# Patient Record
Sex: Female | Born: 1965 | Race: Black or African American | Hispanic: No | Smoking: Never smoker
Health system: Southern US, Community
[De-identification: ages and names within clinical notes are randomized; demographics above are authoritative.]

## PROBLEM LIST (undated history)

## (undated) DIAGNOSIS — N946 Dysmenorrhea, unspecified: Secondary | ICD-10-CM

## (undated) DIAGNOSIS — I10 Essential (primary) hypertension: Secondary | ICD-10-CM

## (undated) HISTORY — PX: HAND SURGERY: SHX662

---

## 2009-10-25 HISTORY — PX: GALLBLADDER SURGERY: SHX652

## 2013-11-13 ENCOUNTER — Encounter (HOSPITAL_COMMUNITY): Payer: Self-pay | Admitting: Emergency Medicine

## 2013-11-13 ENCOUNTER — Emergency Department (HOSPITAL_COMMUNITY)
Admission: EM | Admit: 2013-11-13 | Discharge: 2013-11-13 | Discharge status: Against Medical Advice | Payer: Self-pay | Attending: Emergency Medicine | Admitting: Emergency Medicine

## 2013-11-13 ENCOUNTER — Emergency Department (HOSPITAL_COMMUNITY): Payer: Self-pay

## 2013-11-13 DIAGNOSIS — R112 Nausea with vomiting, unspecified: Secondary | ICD-10-CM | POA: Insufficient documentation

## 2013-11-13 DIAGNOSIS — R1084 Generalized abdominal pain: Secondary | ICD-10-CM | POA: Insufficient documentation

## 2013-11-13 DIAGNOSIS — R109 Unspecified abdominal pain: Secondary | ICD-10-CM

## 2013-11-13 DIAGNOSIS — R630 Anorexia: Secondary | ICD-10-CM | POA: Insufficient documentation

## 2013-11-13 DIAGNOSIS — Z79899 Other long term (current) drug therapy: Secondary | ICD-10-CM | POA: Insufficient documentation

## 2013-11-13 DIAGNOSIS — I10 Essential (primary) hypertension: Secondary | ICD-10-CM | POA: Insufficient documentation

## 2013-11-13 DIAGNOSIS — Z3202 Encounter for pregnancy test, result negative: Secondary | ICD-10-CM | POA: Insufficient documentation

## 2013-11-13 HISTORY — DX: Essential (primary) hypertension: I10

## 2013-11-13 LAB — COMPREHENSIVE METABOLIC PANEL
ALT: 6 U/L (ref 0–35)
AST: 13 U/L (ref 0–37)
Albumin: 3.6 g/dL (ref 3.5–5.2)
Alkaline Phosphatase: 74 U/L (ref 39–117)
BILIRUBIN TOTAL: 0.3 mg/dL (ref 0.3–1.2)
BUN: 7 mg/dL (ref 6–23)
CHLORIDE: 105 meq/L (ref 96–112)
CO2: 21 meq/L (ref 19–32)
CREATININE: 0.68 mg/dL (ref 0.50–1.10)
Calcium: 8.9 mg/dL (ref 8.4–10.5)
GFR calc Af Amer: >90 mL/min (ref >90)
Glucose, Bld: 106 mg/dL — ABNORMAL HIGH (ref 70–99)
Potassium: 3.6 mEq/L — ABNORMAL LOW (ref 3.7–5.3)
Sodium: 141 mEq/L (ref 137–147)
Total Protein: 7.1 g/dL (ref 6.0–8.3)

## 2013-11-13 LAB — CBC WITH DIFFERENTIAL/PLATELET
Basophils Absolute: 0 10*3/uL (ref 0.0–0.1)
Basophils Relative: 1 % (ref 0–1)
Eosinophils Absolute: 0.2 10*3/uL (ref 0.0–0.7)
Eosinophils Relative: 6 % — ABNORMAL HIGH (ref 0–5)
HEMATOCRIT: 36.8 % (ref 36.0–46.0)
HEMOGLOBIN: 12.7 g/dL (ref 12.0–15.0)
LYMPHS ABS: 1.4 10*3/uL (ref 0.7–4.0)
LYMPHS PCT: 34 % (ref 12–46)
MCH: 30.5 pg (ref 26.0–34.0)
MCHC: 34.5 g/dL (ref 30.0–36.0)
MCV: 88.2 fL (ref 78.0–100.0)
MONO ABS: 0.3 10*3/uL (ref 0.1–1.0)
Monocytes Relative: 7 % (ref 3–12)
Neutro Abs: 2.2 10*3/uL (ref 1.7–7.7)
Neutrophils Relative %: 53 % (ref 43–77)
Platelets: 266 10*3/uL (ref 150–400)
RBC: 4.17 MIL/uL (ref 3.87–5.11)
RDW: 13.9 % (ref 11.5–15.5)
WBC: 4.2 10*3/uL (ref 4.0–10.5)

## 2013-11-13 LAB — URINALYSIS, ROUTINE W REFLEX MICROSCOPIC
Glucose, UA: NEGATIVE mg/dL
Hgb urine dipstick: NEGATIVE
KETONES UR: NEGATIVE mg/dL
LEUKOCYTES UA: NEGATIVE
Nitrite: NEGATIVE
PROTEIN: NEGATIVE mg/dL
Specific Gravity, Urine: 1.024 (ref 1.005–1.030)
UROBILINOGEN UA: 1 mg/dL (ref 0.0–1.0)
pH: 6.5 (ref 5.0–8.0)

## 2013-11-13 LAB — LIPASE, BLOOD: Lipase: 18 U/L (ref 11–59)

## 2013-11-13 LAB — CG4 I-STAT (LACTIC ACID): Lactic Acid, Venous: 0.69 mmol/L (ref 0.5–2.2)

## 2013-11-13 LAB — POCT PREGNANCY, URINE: Preg Test, Ur: NEGATIVE

## 2013-11-13 MED ORDER — SODIUM CHLORIDE 0.9 % IV BOLUS (SEPSIS)
1000.0000 mL | Freq: Once | INTRAVENOUS | Status: AC
  Administered 2013-11-13: 1000 mL via INTRAVENOUS

## 2013-11-13 MED ORDER — MORPHINE SULFATE 4 MG/ML IJ SOLN
4.0000 mg | Freq: Once | INTRAMUSCULAR | Status: AC
  Administered 2013-11-13: 4 mg via INTRAVENOUS
  Filled 2013-11-13: qty 1

## 2013-11-13 MED ORDER — ONDANSETRON 4 MG PO TBDP: ORAL_TABLET | ORAL | Status: DC

## 2013-11-13 MED ORDER — IOHEXOL 300 MG/ML  SOLN: 50.0000 mL | Freq: Once | INTRAMUSCULAR | Status: DC | PRN

## 2013-11-13 MED ORDER — ONDANSETRON HCL 4 MG/2ML IJ SOLN
4.0000 mg | Freq: Once | INTRAMUSCULAR | Status: AC
  Administered 2013-11-13: 4 mg via INTRAVENOUS
  Filled 2013-11-13: qty 2

## 2013-11-13 MED ORDER — CLONIDINE HCL 0.1 MG PO TABS
0.2000 mg | ORAL_TABLET | Freq: Once | ORAL | Status: AC
  Administered 2013-11-13: 0.2 mg via ORAL
  Filled 2013-11-13: qty 2

## 2013-11-13 MED ORDER — CLONIDINE HCL 0.2 MG PO TABS: 0.2000 mg | ORAL_TABLET | Freq: Three times a day (TID) | ORAL | Status: DC

## 2013-11-13 NOTE — ED Notes (Signed)
Pt up to and to the restroom.

## 2013-11-13 NOTE — Progress Notes (Signed)
   CARE MANAGEMENT ED NOTE 11/13/2013  Patient:  Regina Estes, Regina Estes   Account Number:  [id number]  Date Initiated:  11/13/2013  Documentation initiated by:  Jackelyn Poling  Subjective/Objective Assessment:   48 yr old female with coventry coverage confirms she recently moved to [place] with family and does not have a coventry pcp at this time but she is aware of how to obtain an in network coventry pcp     Subjective/Objective Assessment Detail:   Pt prefers to complete internet search and to inquire of family their recommendations vs CM offering her a list of [place] providers [place] in [place] [id number]     Action/Plan:   CM offered to assist pt with a list of coventry in net work providers   Action/Plan Detail:   Anticipated DC Date:  11/13/2013     Status Recommendation to Physician:   Result of Recommendation:    Other ED [place]  Other  PCP issues  Outpatient Services - Pt will follow up    Choice offered to / List presented to:            Status of service:  Completed, signed off  ED Comments:   ED Comments Detail:  Cm reviewed ED level of care for crisis/emergent services and community pcp level of care to manage continuous or chronic medical concerns.  The pt voiced understanding CM encouraged pt and discussed pt's responsibility to verify with pt's insurance carrier that any recommended medical provider offered by any emergency room or a hospital provider is within the carrier's network. The pt voiced understanding

## 2013-11-13 NOTE — ED Notes (Signed)
Pt c/o gen abd pain, NV.  No diarrhea.  Sx since Sunday.

## 2013-11-13 NOTE — ED Provider Notes (Signed)
CSN: [id number]     Arrival date & time 11/13/13  1150 History   None    Chief Complaint  Patient presents with  . Abdominal Pain  . Nausea  . Emesis   (Consider location/radiation/quality/duration/timing/severity/associated sxs/prior Treatment) Patient is a 48 y.o. female presenting with abdominal pain and vomiting.  Abdominal Pain Associated symptoms: vomiting   Associated symptoms: no chest pain, no constipation, no diarrhea, no dysuria, no hematuria, no shortness of breath, no vaginal bleeding and no vaginal discharge   Emesis Associated symptoms: abdominal pain   Associated symptoms: no diarrhea    48 yo female presents with gradual onset abdominal pain with N/V x 2 days. Pain described as sharp constant currently 10/10 pain localized to lower abdomen without radiation. Patient asked to point to where pain is worse and she rubs her lower abdomen stating that there is no specific point that hurts. Patient states She was up all night vomiting every 2 hours. Patient describes a yellowish emesis. Denies any diarrhea, fever/chills. Denies any urinary symptoms, vaginal pain, bleeding/discharge. Admits once a week BMs is normal for her and states last BM was prior to onset of symptoms a couple days ago.  Patient denies sexual activity and states her menstrual cycles are regular every month. LMP was last week. Patient PMH significant for HTN and prior Cholecystectomy. Patient has just recently moved from [place] and states she has not taken her BP medication in 3 days because she cannot find it (believes she has lost it in the move).   Denies alcohol use.   Patient  Past Medical History  Diagnosis Date  . Hypertension    History reviewed. No pertinent past surgical history. History reviewed. No pertinent family history. History  Substance Use Topics  . Smoking status: Never Smoker   . Smokeless tobacco: Not on file  . Alcohol Use: No   OB History   Grav Para Term Preterm Abortions  TAB SAB Ect Mult Living                 Review of Systems  Constitutional: Positive for appetite change (poor ).  Respiratory: Negative for shortness of breath.   Cardiovascular: Negative for chest pain.  Gastrointestinal: Positive for vomiting and abdominal pain. Negative for diarrhea, constipation, blood in stool and rectal pain.  Genitourinary: Negative for dysuria, frequency, hematuria, flank pain, decreased urine volume, vaginal bleeding, vaginal discharge, difficulty urinating, vaginal pain, menstrual problem and pelvic pain.  All other systems reviewed and are negative.    Allergies  Review of patient's allergies indicates no known allergies.  Home Medications   Current Outpatient Rx  Name  Route  Sig  Dispense  Refill  . ibuprofen (ADVIL,MOTRIN) 800 MG tablet   Oral   Take 800 mg by mouth every 8 (eight) hours as needed.         . cloNIDine (CATAPRES) 0.2 MG tablet   Oral   Take 1 tablet (0.2 mg total) by mouth 3 (three) times daily.   90 tablet   0   . ondansetron (ZOFRAN ODT) 4 MG disintegrating tablet      4mg  ODT q4 hours prn nausea/vomit   10 tablet   0    BP 155/98  Pulse 61  Temp(Src) 98.2 F (36.8 C) (Oral)  Resp 16  SpO2 100%  LMP 11/06/2013 Physical Exam  Nursing note and vitals reviewed. Constitutional: She is oriented to person, place, and time. She appears well-developed and well-nourished. No distress.  HENT:  Head: Normocephalic and atraumatic.  Eyes: Conjunctivae and EOM are normal.  Cardiovascular: Normal rate and regular rhythm.  Exam reveals no gallop and no friction rub.   No murmur heard. Pulmonary/Chest: Effort normal and breath sounds normal. No respiratory distress. She has no wheezes. She has no rales.  Abdominal: Soft. Bowel sounds are normal. She exhibits no distension and no mass. There is no hepatosplenomegaly. There is generalized tenderness. There is CVA tenderness. There is no rigidity, no rebound, no guarding, no  tenderness at McBurney's point and negative Murphy's sign.  Musculoskeletal: Normal range of motion. She exhibits no edema.  Neurological: She is alert and oriented to person, place, and time.  Skin: Skin is warm and dry. No rash noted. She is not diaphoretic.  Psychiatric: She has a normal mood and affect. Her behavior is normal.    ED Course  Procedures (including critical care time) Labs Review Labs Reviewed  CBC WITH DIFFERENTIAL - Abnormal; Notable for the following:    Eosinophils Relative 6 (*)    All other components within normal limits  COMPREHENSIVE METABOLIC PANEL - Abnormal; Notable for the following:    Potassium 3.6 (*)    Glucose, Bld 106 (*)    All other components within normal limits  URINALYSIS, ROUTINE W REFLEX MICROSCOPIC - Abnormal; Notable for the following:    Color, Urine AMBER (*)    APPearance CLOUDY (*)    Bilirubin Urine SMALL (*)    All other components within normal limits  LIPASE, BLOOD  POCT PREGNANCY, URINE  CG4 I-STAT (LACTIC ACID)   Imaging Review No results found.  EKG Interpretation   None       MDM   1. Abdominal pain   2. N&V (nausea and vomiting)     Patient afebrile. Hypertensive on admission, suspect related rebound HTN from abrupt discontinuation of clonidine. BP improved throughout stay at ED.  UA not consistent with UTI.  Urine preg negative Lipase Normal.  Lactic acid WNL.    Patient pain unable to be controlled in ED though patient does not appear to be in significant pain. Patient up walking around in NAD. Patient nausea resolved. Patient recommended to undergo CT abdomen/pelvis. Patient refuses study at this time. States she would rather go home and try to sleep it off. Patient informed that we cannot adequately assess her pain source without the aforementioned imaging. Patient confirms understanding and still refuses study at this time. Patient left AMA. Patient given rx for her BP medication and resource guide.  Advised followup with PCP as soon as possible.   Meds given in ED:  Medications  iohexol (OMNIPAQUE) 300 MG/ML solution 50 mL (not administered)  ondansetron (ZOFRAN) injection 4 mg (4 mg Intravenous Given 11/13/13 1541)  sodium chloride 0.9 % bolus 1,000 mL (0 mLs Intravenous Stopped 11/13/13 1723)  morphine 4 MG/ML injection 4 mg (4 mg Intravenous Given 11/13/13 1541)  morphine 4 MG/ML injection 4 mg (4 mg Intravenous Given 11/13/13 1628)  cloNIDine (CATAPRES) tablet 0.2 mg (0.2 mg Oral Given 11/13/13 1744)    Discharge Medication List as of 11/13/2013  5:37 PM    START taking these medications   Details  ondansetron (ZOFRAN ODT) 4 MG disintegrating tablet 4mg  ODT q4 hours prn nausea/vomit, Print          Sherrie George, PA-C 11/14/13 1510

## 2013-11-13 NOTE — Discharge Instructions (Signed)
Follow up as soon as possible with a primary care provider. Refer to resource guide below for follow up. If your symptoms worsen please return to ED.    Emergency Department Resource Guide 1) Find a Doctor and Pay Out of Pocket Although you won't have to find out who is covered by your insurance plan, it is a good idea to ask around and get recommendations. You will then need to call the office and see if the doctor you have chosen will accept you as a new patient and what types of options they offer for patients who are self-pay. Some doctors offer discounts or will set up payment plans for their patients who do not have insurance, but you will need to ask so you aren't surprised when you get to your appointment.  2) Contact Your Local Health Department Not all health departments have doctors that can see patients for sick visits, but many do, so it is worth a call to see if yours does. If you don't know where your local health department is, you can check in your phone book. The CDC also has a tool to help you locate your state's health department, and many state websites also have listings of all of their local health departments.  3) Find a [facility] If your illness is not likely to be very severe or complicated, you may want to try a walk in clinic. These are popping up all over the country in pharmacies, drugstores, and shopping centers. They're usually staffed by nurse practitioners or physician assistants that have been trained to treat common illnesses and complaints. They're usually fairly quick and inexpensive. However, if you have serious medical issues or chronic medical problems, these are probably not your best option.  No Primary Care Doctor: - Call Health Connect at  [phone] - they can help you locate a primary care doctor that  accepts your insurance, provides certain services, etc. - Physician Referral Service- [phone]  Chronic Pain Problems: Organization          Address  Phone   Notes  [facility]  [phone] Patients need to be referred by their primary care doctor.   Medication Assistance: Organization         Address  Phone   Notes  [facility] Medication [facility] [address] [place], [place] [address] [phone] --Must be a resident of [facility] -- Must have NO insurance coverage whatsoever (no Medicaid/ Medicare, etc.) -- The pt. MUST have a primary care doctor that directs their care regularly and follows them in the community   MedAssist  [phone]   [employer]  [phone]    Agencies that provide inexpensive medical care: Organization         Address  Phone   Notes  [place]  [phone]   [facility]    [phone]   [facility] [place], [place] [address] [phone]   [place] [address], [place] [phone]   Planned Parenthood    [phone]   [facility]    [phone]   [place] and [place] [address], [place] Phone:  [phone], Fax:  [phone] Hours of Operation:  9 am - 6 pm, M-F.  Also accepts Medicaid/Medicare and self-pay.  [facility] for [place] [address] [place],  [address], [place] Phone: [phone], Fax: [phone]. Hours of Operation:  8:30 am - 5:30 pm, M-F.  Also accepts Medicaid and self-pay.  [facility] [address] [place] Phone: [phone]   [place], [place], [place] [phone], Ext. [phone] Mondays & Thursdays: 7-9 AM.  First 15 patients are seen on a first come, first serve basis.    [place] Providers:  Organization         Address  Phone   Notes  [facility] [address], [place] [phone] Also accepts self-pay patients.  [facility] [address], [place]  [phone]   [place], [address], [place] [phone]   [facility] [address], [place] [phone]   Lucianne Lei [address], [place]   [phone] Only accepts [place] Access [place] patients after they have their name applied to their card.   Self-Pay (no insurance) in [facility]:  Organization         Address  Phone   Notes  Sickle Cell Patients, [facility] Internal Medicine [place] [phone]   [facility] [place] [phone]   [facility] [place]  [place], [place], [place] [phone]   [facility]/Dr. Osei-Bonsu  [address] [place] or [address], [place] [phone] Phone number for both [place] and [place] locations is the same.  Urgent Medical and [facility] [address] [place] [phone]   [facility] [address] [place] or [address] [phone]   [facility] [address], [place] [phone], phone; [phone], fax Sees patients 1st and 3rd Saturday of every month.  Must not qualify for public or private insurance (i.e. Medicaid, Medicare, [place] Health Choice, Veterans' Benefits)  Household income should be no more than 200% of the poverty level The clinic cannot treat you if you are pregnant or think you are pregnant  Sexually transmitted diseases are not treated at the clinic.    Dental Care: Organization         Address  Phone  Notes  [facility] Department of [facility] [place] [phone] Accepts children up to age 74 who are enrolled in [place] or [place]; pregnant women with a Medicaid card; and  children who have applied for Medicaid or [place] Health Choice, but were declined, whose parents can pay a reduced fee at time of service.  [facility] Department of [facility]  [address], [place] [phone] Accepts children up to age 79 who are enrolled in [place] or [place]; pregnant women with a Medicaid card; and children who have applied for Medicaid or [place] Health Choice, but were declined, whose parents can pay a reduced fee at time of service.  [place] Adult [facility] Access PROGRAM  [place] [phone] Patients are seen by appointment only. Walk-ins are not accepted. [place] will see patients 64 years of age and older. Monday - Tuesday (8am-5pm) Most Wednesdays (8:30-5pm) $30 per visit, cash only  [place]  [address], [place] [phone] Patients are seen by appointment only. Walk-ins are not accepted. [place] will see patients 31 years of age and older. One Wednesday Evening (Monthly: Volunteer Based).  $30 per visit, cash only  [place]  [phone] for adults; Children under age 53, call Graduate Pediatric Dentistry at [phone]. Children aged 35-14, please call [phone] to request a pediatric application.  Dental services are provided in all areas of dental care including fillings, crowns and bridges, complete and partial dentures, implants, gum treatment, root canals, and extractions. Preventive care is also provided. Treatment is provided to both adults and children. Patients are selected via a lottery and there is often a waiting list.   [facility] [address] [place]  [phone] [email or web address]   [facility] [address] [place], [place] [phone], Ext. [phone] Second and Fourth Thursday of each month, opens at 6:30 AM; Clinic ends at 9 AM.  Patients are seen on a first-come first-served  basis, and a limited number are seen during each clinic.   [facility]  [address] [place] Danker [place], [place] [phone]   Eligibility Requirements You must have lived in [place], [place], or [place] counties for at least the last three months.   You cannot be eligible for state or federal sponsored [employer], including [employer], [place], or [employer].   You generally cannot be eligible for healthcare insurance through your employer.    How to apply: Eligibility screenings are held every Tuesday and Wednesday afternoon from 1:00 pm until 4:00 pm. You do not need an appointment for the interview!  [facility] [address], [place], [place]   [place]  [place] Department  [place]  [phone]    Behavioral Health Resources in the Community: Intensive Outpatient Programs Organization         Address  Phone  Notes  [place] [address], [place], [place] [phone]   [facility] Outpatient [address] [place], [place]   ADS: Alcohol & Drug Svcs [address], [place], [place]   [place] [address],  [place], [place] or [phone]   Substance Abuse Resources Organization         Address  Phone  Notes  Alcohol and Drug Services  [phone]   [place]  [phone]   The [place]   [place]  [phone]   Residential & Outpatient Substance Abuse Program  [phone]   Psychological Services Organization         Address  Phone  Notes  [facility] [place]  [place]  [phone]   [place] [address], [place] or [phone]    Mobile Crisis Teams Organization          Address  Phone  Notes  [facility], Mobile Crisis Care Unit  [phone]   [facility] Psychotherapeutic Services  [address] [place], [place]   [place] Levels [address], [place] [phone]    Self-Help/Support Groups Organization         Address  Phone             Notes  [place]. of [place] -  variety of support groups  [phone] Call for more information  Narcotics Anonymous (NA), [facility] [address], [employer] [place]  2 meetings at this location   Residential Facilities manager         Address  Phone  Notes  [facility] [place],    [place]  [phone]   [facility]  [address] [place] [phone], [place], [place]   [place], [place] [phone] Admissions: 8am-3pm M-F  Incentives Substance [place] [address]    [place], [place] [phone]   [facility] [address] [place], [place], [place]   [facility] [address]  [place], [place]   [facility] Programs - Intensive Outpatient [address], [place], [place]   [facility] [address]  [place], [place] [phone] or [phone]   Residential Treatment Services (RTS) [address], [place], [place] Accepts Medicaid  [facility] [place] [address],  [place] [phone] Substance Abuse/Addiction Treatment   [facility] Organization         Address  Phone  Notes  [facility]  [phone]   Domenic Schwab, PhD [address] [place] [place], [place]   [phone] or [phone]   [place], [place] [phone]   [facility] [address] [place], [place] [phone] Insurance/Medicaid/sponsorship  through [facility] and Families [address] [place]                                    [place], [place] [phone] [place] [address], [place] [phone]    Dr. Adele Schilder  [phone]   [facility] Dept. 1) [address] [place] 2) [place] 3)  [place] [address], [place] [phone]  [phone]   [place] [phone] or [phone] (After Hours)

## 2013-11-16 NOTE — ED Provider Notes (Signed)
Medical screening examination/treatment/procedure(s) were conducted as a shared visit with non-physician practitioner(s) and myself.  I personally evaluated the patient during the encounter. Pt presents w/ low ab pain n/v.  Although pt's pain unable to be controlled in ED, she appeared comfortable and had benign abdominal exam on my PE.  She was offered CT ab/pelvis which she declined.  It has been explained to pt we cannot diagnose a potentially life-threatened cause of ab pain w/o CT.  She has chosen to leave AMA. I believe pt has the capacity to make this decision.   EKG Interpretation   None         Neta Ehlers, MD 11/16/13 1036

## 2013-11-20 ENCOUNTER — Emergency Department (HOSPITAL_COMMUNITY)
Admission: EM | Admit: 2013-11-20 | Discharge: 2013-11-21 | Disposition: A | Discharge status: Home / Self Care | Payer: Self-pay | Attending: Emergency Medicine | Admitting: Emergency Medicine

## 2013-11-20 ENCOUNTER — Encounter (HOSPITAL_COMMUNITY): Payer: Self-pay | Admitting: Emergency Medicine

## 2013-11-20 ENCOUNTER — Emergency Department (HOSPITAL_COMMUNITY): Payer: Self-pay

## 2013-11-20 DIAGNOSIS — D259 Leiomyoma of uterus, unspecified: Secondary | ICD-10-CM | POA: Insufficient documentation

## 2013-11-20 DIAGNOSIS — K5900 Constipation, unspecified: Secondary | ICD-10-CM | POA: Insufficient documentation

## 2013-11-20 DIAGNOSIS — Z9089 Acquired absence of other organs: Secondary | ICD-10-CM | POA: Insufficient documentation

## 2013-11-20 DIAGNOSIS — R109 Unspecified abdominal pain: Secondary | ICD-10-CM | POA: Insufficient documentation

## 2013-11-20 DIAGNOSIS — M25559 Pain in unspecified hip: Secondary | ICD-10-CM | POA: Insufficient documentation

## 2013-11-20 DIAGNOSIS — Z79899 Other long term (current) drug therapy: Secondary | ICD-10-CM | POA: Insufficient documentation

## 2013-11-20 DIAGNOSIS — Z3202 Encounter for pregnancy test, result negative: Secondary | ICD-10-CM | POA: Insufficient documentation

## 2013-11-20 DIAGNOSIS — I10 Essential (primary) hypertension: Secondary | ICD-10-CM | POA: Insufficient documentation

## 2013-11-20 LAB — URINALYSIS, ROUTINE W REFLEX MICROSCOPIC
Glucose, UA: NEGATIVE mg/dL
Hgb urine dipstick: NEGATIVE
Ketones, ur: NEGATIVE mg/dL
Nitrite: NEGATIVE
Protein, ur: 30 mg/dL — AB
Specific Gravity, Urine: 1.026 (ref 1.005–1.030)
Urobilinogen, UA: 1 mg/dL (ref 0.0–1.0)
pH: 6.5 (ref 5.0–8.0)

## 2013-11-20 LAB — CBC WITH DIFFERENTIAL/PLATELET
Basophils Absolute: 0 10*3/uL (ref 0.0–0.1)
Basophils Relative: 1 % (ref 0–1)
Eosinophils Absolute: 0.3 10*3/uL (ref 0.0–0.7)
Eosinophils Relative: 5 % (ref 0–5)
HCT: 37.8 % (ref 36.0–46.0)
Hemoglobin: 13 g/dL (ref 12.0–15.0)
Lymphocytes Relative: 49 % — ABNORMAL HIGH (ref 12–46)
Lymphs Abs: 2.8 10*3/uL (ref 0.7–4.0)
MCH: 30.4 pg (ref 26.0–34.0)
MCHC: 34.4 g/dL (ref 30.0–36.0)
MCV: 88.5 fL (ref 78.0–100.0)
Monocytes Absolute: 0.3 10*3/uL (ref 0.1–1.0)
Monocytes Relative: 6 % (ref 3–12)
Neutro Abs: 2.2 10*3/uL (ref 1.7–7.7)
Neutrophils Relative %: 39 % — ABNORMAL LOW (ref 43–77)
Platelets: 248 10*3/uL (ref 150–400)
RBC: 4.27 MIL/uL (ref 3.87–5.11)
RDW: 13.9 % (ref 11.5–15.5)
WBC: 5.7 10*3/uL (ref 4.0–10.5)

## 2013-11-20 LAB — COMPREHENSIVE METABOLIC PANEL
ALT: 8 U/L (ref 0–35)
AST: 14 U/L (ref 0–37)
Albumin: 3.8 g/dL (ref 3.5–5.2)
Alkaline Phosphatase: 74 U/L (ref 39–117)
BUN: 8 mg/dL (ref 6–23)
CO2: 22 mEq/L (ref 19–32)
Calcium: 9 mg/dL (ref 8.4–10.5)
Chloride: 103 mEq/L (ref 96–112)
Creatinine, Ser: 0.73 mg/dL (ref 0.50–1.10)
GFR calc Af Amer: >90 mL/min (ref >90)
GFR calc non Af Amer: >90 mL/min (ref >90)
Glucose, Bld: 132 mg/dL — ABNORMAL HIGH (ref 70–99)
Potassium: 3.5 mEq/L — ABNORMAL LOW (ref 3.7–5.3)
Sodium: 139 mEq/L (ref 137–147)
Total Bilirubin: <0.2 mg/dL — ABNORMAL LOW (ref 0.3–1.2)
Total Protein: 7.5 g/dL (ref 6.0–8.3)

## 2013-11-20 LAB — POCT PREGNANCY, URINE: PREG TEST UR: NEGATIVE

## 2013-11-20 LAB — URINE MICROSCOPIC-ADD ON

## 2013-11-20 LAB — LIPASE, BLOOD: Lipase: 25 U/L (ref 11–59)

## 2013-11-20 MED ORDER — MORPHINE SULFATE 4 MG/ML IJ SOLN
4.0000 mg | Freq: Once | INTRAMUSCULAR | Status: AC
  Administered 2013-11-20: 4 mg via INTRAVENOUS
  Filled 2013-11-20: qty 1

## 2013-11-20 MED ORDER — ONDANSETRON HCL 4 MG/2ML IJ SOLN
4.0000 mg | Freq: Once | INTRAMUSCULAR | Status: AC
  Administered 2013-11-20: 4 mg via INTRAVENOUS
  Filled 2013-11-20: qty 2

## 2013-11-20 MED ORDER — IOHEXOL 300 MG/ML  SOLN
100.0000 mL | Freq: Once | INTRAMUSCULAR | Status: AC | PRN
  Administered 2013-11-20: 100 mL via INTRAVENOUS

## 2013-11-20 MED ORDER — IOHEXOL 300 MG/ML  SOLN
25.0000 mL | Freq: Once | INTRAMUSCULAR | Status: AC | PRN
  Administered 2013-11-20: 25 mL via ORAL

## 2013-11-20 MED ORDER — SODIUM CHLORIDE 0.9 % IV BOLUS (SEPSIS)
500.0000 mL | Freq: Once | INTRAVENOUS | Status: AC
  Administered 2013-11-20: 500 mL via INTRAVENOUS

## 2013-11-20 NOTE — ED Notes (Signed)
2 unsuccessful IV attempts.

## 2013-11-20 NOTE — ED Provider Notes (Signed)
CSN: [id number]     Arrival date & time 11/20/13  1628 History   First MD Initiated Contact with Patient 11/20/13 2156     Chief Complaint  Patient presents with  . Nausea  . Emesis  . Leg Pain   (Consider location/radiation/quality/duration/timing/severity/associated sxs/prior Treatment) HPI  Pt is a 48 yo female with hx of the same pain x 1 week.  Pt states she was seen in the ER at that time, but preferred to go home and try to manage her own symptoms as opposed to waiting for a CT.  Imaging was not done last week.  Pt states the pain has worsened since then, and progressed to sharp pains in her thighs as well.  She describes the pain as sharp and constant, and rates it 10/10.  She also complains of N/V and states she hasn't eaten or had anything to drink in the past week. She denies diarrhea, and states that normally she has bowel movements only every 1.5 to 2 weeks for as long as she can remember.  She has had a cholecystectomy, but denies any other abdominal surgeries.  Her LMP was 1 week ago, and she states she is not currently sexually active, so states there's no way she could be pregnant.  She denies vaginal or urinary changes.  She also denies chest pain, SOB, fever, or chills.  Pt also states she has not taken her HTN meds x 1 week due to losing it in her move from [place], and after having it filled in the ER, the Nausea and vomiting.   Past Medical History  Diagnosis Date  . Hypertension    History reviewed. No pertinent past surgical history. History reviewed. No pertinent family history. History  Substance Use Topics  . Smoking status: Never Smoker   . Smokeless tobacco: Not on file  . Alcohol Use: No   OB History   Grav Para Term Preterm Abortions TAB SAB Ect Mult Living                 Review of Systems  Constitutional: Positive for appetite change. Negative for fever, chills and fatigue.  HENT: Negative.   Respiratory: Negative for chest tightness and shortness of  breath.   Cardiovascular: Negative for chest pain.  Gastrointestinal: Positive for nausea, vomiting, abdominal pain and constipation. Negative for diarrhea and blood in stool.  Genitourinary: Negative for dysuria, urgency, frequency, hematuria, flank pain, decreased urine volume, vaginal discharge, difficulty urinating, vaginal pain and menstrual problem.  Musculoskeletal: Negative for back pain and myalgias.  Neurological: Negative.     Allergies  Review of patient's allergies indicates no known allergies.  Home Medications   Current Outpatient Rx  Name  Route  Sig  Dispense  Refill  . cloNIDine (CATAPRES) 0.2 MG tablet   Oral   Take 1 tablet (0.2 mg total) by mouth 3 (three) times daily.   90 tablet   0   . ibuprofen (ADVIL,MOTRIN) 800 MG tablet   Oral   Take 800 mg by mouth every 8 (eight) hours as needed (pain).          . ondansetron (ZOFRAN ODT) 4 MG disintegrating tablet      4mg  ODT q4 hours prn nausea/vomit   10 tablet   0    LMP 11/06/2013 Physical Exam  Constitutional: She is oriented to person, place, and time. She appears well-developed and well-nourished.  HENT:  Head: Normocephalic and atraumatic.  Eyes: Pupils are equal, round, and reactive  to light.  Neck: Normal range of motion.  Cardiovascular: Normal rate and regular rhythm.   No murmur heard. Pulmonary/Chest: Effort normal and breath sounds normal. No respiratory distress.  Abdominal: She exhibits no distension. There is tenderness. There is no rebound. No hernia.  Tender to bilateral lower quadrants without discrete palpable mass. BS positive. No distender. Upper quadrants of abdomen non-tender.   Musculoskeletal: Normal range of motion.       Right shoulder: She exhibits normal range of motion, no tenderness, no swelling and no deformity.  Thighs bilaterally non-tender, without redness, swelling or discoloration.  Neurological: She is alert and oriented to person, place, and time.  Skin: Skin  is warm and dry. She is not diaphoretic.  Psychiatric: She has a normal mood and affect.    ED Course  Procedures (including critical care time) Labs Review Labs Reviewed  CBC WITH DIFFERENTIAL - Abnormal; Notable for the following:    Neutrophils Relative % 39 (*)    Lymphocytes Relative 49 (*)    All other components within normal limits  COMPREHENSIVE METABOLIC PANEL - Abnormal; Notable for the following:    Potassium 3.5 (*)    Glucose, Bld 132 (*)    Total Bilirubin <0.2 (*)    All other components within normal limits  URINALYSIS, ROUTINE W REFLEX MICROSCOPIC - Abnormal; Notable for the following:    Color, Urine AMBER (*)    APPearance TURBID (*)    Bilirubin Urine SMALL (*)    Protein, ur 30 (*)    Leukocytes, UA SMALL (*)    All other components within normal limits  URINE MICROSCOPIC-ADD ON - Abnormal; Notable for the following:    Squamous Epithelial / LPF MANY (*)    Crystals CA OXALATE CRYSTALS (*)    All other components within normal limits  LIPASE, BLOOD  POCT PREGNANCY, URINE  Ct Abdomen Pelvis W Contrast  11/21/2013   CLINICAL DATA:  Abdominal pain, nausea, emesis, chronic constipation.  EXAM: CT ABDOMEN AND PELVIS WITH CONTRAST  TECHNIQUE: Multidetector CT imaging of the abdomen and pelvis was performed using the standard protocol following bolus administration of intravenous contrast.  CONTRAST:  139mL OMNIPAQUE IOHEXOL 300 MG/ML  SOLN  COMPARISON:  None.  FINDINGS: Lung bases clear.  Normal heart size.  No appreciable abnormality of the liver. Gallbladder not visualized and may be contracted or absent. No biliary ductal dilatation. No appreciable abnormality of the pancreas, spleen, adrenal glands, kidneys with the exception of a nonobstructing interpolar left renal stone.  Moderate stool burden. Appendix not identified. Small bowel loops are of normal course and caliber. No free intraperitoneal air. No lymphadenopathy.  Normal caliber aorta and branch vessels.  Scattered atherosclerotic disease.  Heterogeneous uterine enhancement. Corpus luteal cyst on the left. Small physiologic appearing cyst on the right. Thin walled bladder.  No acute osseous finding.  IMPRESSION: Moderate stool burden.  Bowel gas pattern nonobstructed.  Nonobstructing left renal stone.  Gallbladder contracted or absent.  Appendix not identified to exclude appendicitis. Trace fluid within the right lower quadrant and adnexa is nonspecific however favored to be physiologic.  Heterogeneous uterine enhancement may reflect underlying fibroids.   Electronically Signed   By: Carlos Levering M.D.   On: 11/21/2013 00:53   Imaging Review No results found.  EKG Interpretation   None       MDM  No diagnosis found. 1. Abdominal pain 2. Uterine fibroids 3. Constipation   The patient is non-toxic in appearance. Pain is improved  with medications. No obstruction or acute process on CT scan. Does show moderate stool burden. This finding with normal blood studies is reassuring. Also has likely fibroids although indeterminate. Feel she is stable for discharge home.     Dewaine Oats, PA-C 11/21/13 0110  Dewaine Oats, PA-C 11/21/13 0116

## 2013-11-20 NOTE — ED Notes (Signed)
Pt reports she was seen in ED about 1 week ago for same, nausea/vomiting. Reports she vomited early today after she drank something while in the ED, instructed to remain NPO. Reports the inside of thighs hurt pain 10/10, pain started yesterday. Pt thought pain was from bending over and throwing up. Denies cough.

## 2013-11-21 MED ORDER — HYDROCODONE-ACETAMINOPHEN 5-325 MG PO TABS: 1.0000 | ORAL_TABLET | ORAL | Status: DC | PRN

## 2013-11-21 MED ORDER — KETOROLAC TROMETHAMINE 30 MG/ML IJ SOLN
30.0000 mg | Freq: Once | INTRAMUSCULAR | Status: AC
  Administered 2013-11-21: 30 mg via INTRAVENOUS
  Filled 2013-11-21: qty 1

## 2013-11-21 MED ORDER — IBUPROFEN 800 MG PO TABS: 800.0000 mg | ORAL_TABLET | Freq: Three times a day (TID) | ORAL | Status: DC

## 2013-11-21 MED ORDER — POLYETHYLENE GLYCOL 3350 17 G PO PACK: 17.0000 g | PACK | Freq: Every day | ORAL | Status: DC

## 2013-11-21 NOTE — ED Provider Notes (Signed)
Medical screening examination/treatment/procedure(s) were performed by non-physician practitioner and as supervising physician I was immediately available for consultation/collaboration.    Teressa Lower, MD 11/21/13 [phone]

## 2013-11-21 NOTE — Discharge Instructions (Signed)
Uterine Fibroid A uterine fibroid is a growth (tumor) that occurs in your uterus. This type of tumor is not cancerous and does not spread out of the uterus. You can have one or many fibroids. Fibroids can vary in size, weight, and where they grow in the uterus. Some can become quite large. Most fibroids do not require medical treatment, but some can cause pain or heavy bleeding during and between periods. CAUSES  A fibroid is the result of a single uterine cell that keeps growing (unregulated), which is different than most cells in the human body. Most cells have a control mechanism that keeps them from reproducing without control.  SIGNS AND SYMPTOMS   Bleeding. High-Fiber Diet Fiber is found in fruits, vegetables, and grains. A high-fiber diet encourages the addition of more whole grains, legumes, fruits, and vegetables in your diet. The recommended amount of fiber for adult males is 38 g per day. For adult females, it is 25 g per day. Pregnant and lactating women should get 28 g of fiber per day. If you have a digestive or bowel problem, ask your caregiver for advice before adding high-fiber foods to your diet. Eat a variety of high-fiber foods instead of only a select few type of foods.  PURPOSE  To increase stool bulk.  To make bowel movements more regular to prevent constipation.  To lower cholesterol.  To prevent overeating. WHEN IS THIS DIET USED?  It may be used if you have constipation and hemorrhoids.  It may be used if you have uncomplicated diverticulosis (intestine condition) and irritable bowel syndrome.  It may be used if you need help with weight management.  It may be used if you want to add it to your diet as a protective measure against atherosclerosis, diabetes, and cancer. SOURCES OF FIBER  Whole-grain breads and cereals.  Fruits, such as apples, oranges, bananas, berries, prunes, and pears.  Vegetables, such as green peas, carrots, sweet potatoes, beets,  broccoli, cabbage, spinach, and artichokes.  Legumes, such split peas, soy, lentils.  Almonds. FIBER CONTENT IN FOODS Starches and Grains / Dietary Fiber (g)  Cheerios, 1 cup / 3 g  Corn Flakes cereal, 1 cup / 0.7 g  Rice crispy treat cereal, 1 cup / 0.3 g  Instant oatmeal (cooked),  cup / 2 g  Frosted wheat cereal, 1 cup / 5.1 g  Brown, long-grain rice (cooked), 1 cup / 3.5 g  White, long-grain rice (cooked), 1 cup / 0.6 g  Enriched macaroni (cooked), 1 cup / 2.5 g Legumes / Dietary Fiber (g)  Baked beans (canned, plain, or vegetarian),  cup / 5.2 g  Kidney beans (canned),  cup / 6.8 g  Pinto beans (cooked),  cup / 5.5 g Breads and Crackers / Dietary Fiber (g)  Plain or honey graham crackers, 2 squares / 0.7 g  Saltine crackers, 3 squares / 0.3 g  Plain, salted pretzels, 10 pieces / 1.8 g  Whole-wheat bread, 1 slice / 1.9 g  White bread, 1 slice / 0.7 g  Raisin bread, 1 slice / 1.2 g  Plain bagel, 3 oz / 2 g  Flour tortilla, 1 oz / 0.9 g  Corn tortilla, 1 small / 1.5 g  Hamburger or hotdog bun, 1 small / 0.9 g Fruits / Dietary Fiber (g)  Apple with skin, 1 medium / 4.4 g  Sweetened applesauce,  cup / 1.5 g  Banana,  medium / 1.5 g  Grapes, 10 grapes / 0.4 g  Orange, 1 small / 2.3 g  Raisin, 1.5 oz / 1.6 g  Melon, 1 cup / 1.4 g Vegetables / Dietary Fiber (g)  Green beans (canned),  cup / 1.3 g  Carrots (cooked),  cup / 2.3 g  Broccoli (cooked),  cup / 2.8 g  Peas (cooked),  cup / 4.4 g  Mashed potatoes,  cup / 1.6 g  Lettuce, 1 cup / 0.5 g  Corn (canned),  cup / 1.6 g  Tomato,  cup / 1.1 g Document Released: 10/11/2005 Document Revised: 04/11/2012 Document Reviewed: 01/13/2012 [facility] Patient Information 2014 [place], [place]. Constipation, Adult Constipation is when a person has fewer than 3 bowel movements a week; has difficulty having a bowel movement; or has stools that are dry, hard, or larger than normal. As  people grow older, constipation is more common. If you try to fix constipation with medicines that make you have a bowel movement (laxatives), the problem may get worse. Long-term laxative use may cause the muscles of the colon to become weak. A low-fiber diet, not taking in enough fluids, and taking certain medicines may make constipation worse. CAUSES   Certain medicines, such as antidepressants, pain medicine, iron supplements, antacids, and water pills.   Certain diseases, such as diabetes, irritable bowel syndrome (IBS), thyroid disease, or depression.   Not drinking enough water.   Not eating enough fiber-rich foods.   Stress or travel.  Lack of physical activity or exercise.  Not going to the restroom when there is the urge to have a bowel movement.  Ignoring the urge to have a bowel movement.  Using laxatives too much. SYMPTOMS   Having fewer than 3 bowel movements a week.   Straining to have a bowel movement.   Having hard, dry, or larger than normal stools.   Feeling full or bloated.   Pain in the lower abdomen.  Not feeling relief after having a bowel movement. DIAGNOSIS  Your caregiver will take a medical history and perform a physical exam. Further testing may be done for severe constipation. Some tests may include:   A barium enema X-ray to examine your rectum, colon, and sometimes, your small intestine.  A sigmoidoscopy to examine your lower colon.  A colonoscopy to examine your entire colon. TREATMENT  Treatment will depend on the severity of your constipation and what is causing it. Some dietary treatments include drinking more fluids and eating more fiber-rich foods. Lifestyle treatments may include regular exercise. If these diet and lifestyle recommendations do not help, your caregiver may recommend taking over-the-counter laxative medicines to help you have bowel movements. Prescription medicines may be prescribed if over-the-counter medicines do  not work.  HOME CARE INSTRUCTIONS   Increase dietary fiber in your diet, such as fruits, vegetables, whole grains, and beans. Limit high-fat and processed sugars in your diet, such as [place] fries, hamburgers, cookies, candies, and soda.   A fiber supplement may be added to your diet if you cannot get enough fiber from foods.   Drink enough fluids to keep your urine clear or pale yellow.   Exercise regularly or as directed by your caregiver.   Go to the restroom when you have the urge to go. Do not hold it.  Only take medicines as directed by your caregiver. Do not take other medicines for constipation without talking to your caregiver first. [place] IF:   You have bright red blood in your stool.   Your constipation lasts for more than 4  days or gets worse.   You have abdominal or rectal pain.   You have thin, pencil-like stools.  You have unexplained weight loss. MAKE SURE YOU:   Understand these instructions.  Will watch your condition.  Will get help right away if you are not doing well or get worse. Document Released: 07/09/2004 Document Revised: 01/03/2012 Document Reviewed: 07/23/2013 [facility] Patient Information 2014 [place], [place].  Bladder problems due to the size of the fibroid.  Infertility and miscarriages depending on the size and location of the fibroid. DIAGNOSIS  Uterine fibroids are diagnosed through a physical exam. Your health care provider may feel the lumpy tumors during a pelvic exam. Ultrasonography may be done to get information regarding size, location, and number of tumors.  TREATMENT   Your health care provider may recommend watchful waiting. This involves getting the fibroid checked by your health care provider to see if it grows or shrinks.   Hormone treatment or an intrauterine device (IUD) may be prescribed.   Surgery may be needed to remove the fibroids (myomectomy) or the uterus (hysterectomy). This depends  on your situation. When fibroids interfere with fertility and a woman wants to become pregnant, a health care provider may recommend having the fibroids removed.  [place] care depends on how you were treated. In general:   Keep all follow-up appointments with your health care provider.   Only take over-the-counter or prescription medicines as directed by your health care provider. If you were prescribed a hormone treatment, take the hormone medicines exactly as directed. Do not take aspirin. It can cause bleeding.   Talk to your health care provider about taking iron pills.  If your periods are troublesome but not so heavy, lie down with your feet raised slightly above your heart. Place cold packs on your lower abdomen.   If your periods are heavy, write down the number of pads or tampons you use per month. Bring this information to your health care provider.   Include green vegetables in your diet.  SEEK IMMEDIATE MEDICAL CARE IF:  You have pelvic pain or cramps not controlled with medicines.   You have a sudden increase in pelvic pain.   You have an increase in bleeding between and during periods.   You have excessive periods and soak tampons or pads in a half hour or less.  You feel lightheaded or have fainting episodes. Document Released: 10/08/2000 Document Revised: 08/01/2013 Document Reviewed: 05/10/2013 [facility] Patient Information 2014 [place], [place].

## 2013-11-21 NOTE — Progress Notes (Signed)
   CARE MANAGEMENT ED NOTE 11/20/2013  Patient:  Regina Estes, Regina Estes   Account Number:  [id number]  Date Initiated:  11/20/2013  Documentation initiated by:  Livia Snellen  Subjective/Objective Assessment:   Patient presents to Ed with n/v and leg pain     Subjective/Objective Assessment Detail:     Action/Plan:   Action/Plan Detail:   Anticipated DC Date:       Status Recommendation to Physician:   Result of Recommendation:    Other ED [place]  Other  PCP issues    Choice offered to / List presented to:            Status of service:  Completed, signed off  ED Comments:   ED Comments Detail:  Patient confirms she does not have a pcp.  Patient reports she has been too sick to find one lately.  Patient is aware to go to insurance company website to help her find a physician who is close to her and within network.  No further CM needs at this time.

## 2013-12-02 ENCOUNTER — Emergency Department (HOSPITAL_COMMUNITY): Payer: Self-pay

## 2013-12-02 ENCOUNTER — Encounter (HOSPITAL_COMMUNITY): Payer: Self-pay | Admitting: Emergency Medicine

## 2013-12-02 ENCOUNTER — Emergency Department (HOSPITAL_COMMUNITY)
Admission: EM | Admit: 2013-12-02 | Discharge: 2013-12-02 | Disposition: A | Discharge status: Home / Self Care | Payer: Self-pay | Attending: Emergency Medicine | Admitting: Emergency Medicine

## 2013-12-02 DIAGNOSIS — I10 Essential (primary) hypertension: Secondary | ICD-10-CM | POA: Insufficient documentation

## 2013-12-02 DIAGNOSIS — Z79899 Other long term (current) drug therapy: Secondary | ICD-10-CM | POA: Insufficient documentation

## 2013-12-02 DIAGNOSIS — R52 Pain, unspecified: Secondary | ICD-10-CM | POA: Insufficient documentation

## 2013-12-02 DIAGNOSIS — J069 Acute upper respiratory infection, unspecified: Secondary | ICD-10-CM | POA: Insufficient documentation

## 2013-12-02 DIAGNOSIS — R5381 Other malaise: Secondary | ICD-10-CM | POA: Insufficient documentation

## 2013-12-02 DIAGNOSIS — K1120 Sialoadenitis, unspecified: Secondary | ICD-10-CM | POA: Insufficient documentation

## 2013-12-02 DIAGNOSIS — R6883 Chills (without fever): Secondary | ICD-10-CM | POA: Insufficient documentation

## 2013-12-02 DIAGNOSIS — R1110 Vomiting, unspecified: Secondary | ICD-10-CM | POA: Insufficient documentation

## 2013-12-02 DIAGNOSIS — R5383 Other fatigue: Secondary | ICD-10-CM

## 2013-12-02 LAB — RAPID STREP SCREEN (MED CTR MEBANE ONLY): Streptococcus, Group A Screen (Direct): NEGATIVE

## 2013-12-02 MED ORDER — BENZONATATE 100 MG PO CAPS: 100.0000 mg | ORAL_CAPSULE | Freq: Three times a day (TID) | ORAL | Status: DC | PRN

## 2013-12-02 MED ORDER — IBUPROFEN 200 MG PO TABS
400.0000 mg | ORAL_TABLET | Freq: Once | ORAL | Status: AC
  Administered 2013-12-02: 400 mg via ORAL
  Filled 2013-12-02: qty 2

## 2013-12-02 MED ORDER — ACETAMINOPHEN 325 MG PO TABS
650.0000 mg | ORAL_TABLET | Freq: Once | ORAL | Status: DC
  Filled 2013-12-02: qty 2

## 2013-12-02 MED ORDER — HYDROCODONE-ACETAMINOPHEN 5-325 MG PO TABS: ORAL_TABLET | ORAL | Status: DC

## 2013-12-02 MED ORDER — CLINDAMYCIN HCL 150 MG PO CAPS: ORAL_CAPSULE | ORAL | Status: DC

## 2013-12-02 MED ORDER — OXYCODONE-ACETAMINOPHEN 5-325 MG PO TABS
2.0000 | ORAL_TABLET | Freq: Once | ORAL | Status: AC
  Administered 2013-12-02: 2 via ORAL
  Filled 2013-12-02: qty 2

## 2013-12-02 NOTE — ED Provider Notes (Signed)
CSN: [id number]     Arrival date & time 12/02/13  1033 History   First MD Initiated Contact with Patient 12/02/13 1053     Chief Complaint  Patient presents with  . Emesis  . Cough  . Nasal Congestion  . Generalized Body Aches    HPI Pt was seen at 1100.   Per pt, c/o gradual onset and persistence of constant sore throat, runny/stuffy nose, sinus congestion, generalized body aches/fatigue and cough for the past 2-3 days.  Denies fevers, no rash, no CP/SOB, no N/V/D, no abd pain.  Pt also c/o sudden onset and persistence of constant left submandibular area "swelling" that began this morning. Pt states "it just started swelling up" and "is sore." Denies overlying rash, no injury, no hoarse voice, no dysphagia, no intra-oral edema, no purulent secretions in mouth.     Past Medical History  Diagnosis Date  . Hypertension    No past surgical history on file.  History  Substance Use Topics  . Smoking status: Never Smoker   . Smokeless tobacco: Not on file  . Alcohol Use: No    Review of Systems ROS: Statement: All systems negative except as marked or noted in the HPI; Constitutional: Negative for fever and +chills, generalized body aches/fatigue.. ; ; Eyes: Negative for eye pain, redness and discharge. ; ; ENMT: Negative for ear pain, hoarseness, +nasal congestion, sinus pressure and sore throat. ; ; Cardiovascular: Negative for chest pain, palpitations, diaphoresis, dyspnea and peripheral edema. ; ; Respiratory: +cough. Negative for wheezing and stridor. ; ; Gastrointestinal: Negative for nausea, vomiting, diarrhea, abdominal pain, blood in stool, hematemesis, jaundice and rectal bleeding. . ; ; Genitourinary: Negative for dysuria, flank pain and hematuria. ; ; Musculoskeletal: Negative for back pain and neck pain. Negative for swelling and trauma.; ; Skin: Negative for pruritus, rash, abrasions, blisters, bruising and skin lesion.; ; Neuro: Negative for headache, lightheadedness and neck  stiffness. Negative for weakness, altered level of consciousness , altered mental status, extremity weakness, paresthesias, involuntary movement, seizure and syncope.      Allergies  Review of patient's allergies indicates no known allergies.  Home Medications   Current Outpatient Rx  Name  Route  Sig  Dispense  Refill  . cloNIDine (CATAPRES) 0.2 MG tablet   Oral   Take 1 tablet (0.2 mg total) by mouth 3 (three) times daily.   90 tablet   0    BP 142/108  Pulse 92  Temp(Src) 98 F (36.7 C) (Oral)  Resp 16  SpO2 100%  LMP 11/06/2013 Physical Exam 1105: Physical examination:  Nursing notes reviewed; Vital signs and O2 SAT reviewed;  Constitutional: Well developed, Well nourished, Well hydrated, In no acute distress; Head:  Normocephalic, atraumatic; Eyes: EOMI, PERRL, No scleral icterus; ENMT: TM's clear bilat. +edemetous nasal turbinates bilat with clear rhinorrhea. +left submandibular salivary gland edematous, no overlying erythema, no purulent drainage in mouth. Mouth and pharynx without lesions. No tonsillar exudates. No intra-oral edema. No sublingual edema. No hoarse voice, no drooling, no stridor. No pain with manipulation of larynx. Mouth and pharynx normal, Mucous membranes moist; Neck: Supple, Full range of motion, No lymphadenopathy. No meningeal signs.; Cardiovascular: Regular rate and rhythm, No gallop; Respiratory: Breath sounds clear & equal bilaterally, No wheezes.  Speaking full sentences with ease, Normal respiratory effort/excursion; Chest: Nontender, Movement normal; Abdomen: Soft, Nontender, Nondistended, Normal bowel sounds; Genitourinary: No CVA tenderness; Extremities: Pulses normal, No tenderness, No edema, No calf edema or asymmetry.; Neuro: AA&Ox3, Major CN  grossly intact.  Speech clear. No gross focal motor or sensory deficits in extremities. Climbs on and off stretcher easily by herself. Gait steady.; Skin: Color normal, Warm, Dry.   ED Course  Procedures    EKG Interpretation   None       MDM  MDM Reviewed: previous chart, nursing note and vitals Reviewed previous: labs and CT scan Interpretation: labs and x-ray   Results for orders placed during the hospital encounter of 12/02/13  RAPID STREP SCREEN      Result Value Range   Streptococcus, Group A Screen (Direct) NEGATIVE  NEGATIVE   Dg Chest 2 View 12/02/2013   CLINICAL DATA:  Shortness of Breath  EXAM: CHEST  2 VIEW  COMPARISON:  None.  FINDINGS: Lungs are clear. Heart size and pulmonary vascularity are normal. No adenopathy. There is upper thoracic levoscoliosis.  IMPRESSION: No edema or consolidation.   Electronically Signed   By: Lowella Grip M.D.   On: 12/02/2013 11:41   Ct Abdomen Pelvis W Contrast 11/21/2013   CLINICAL DATA:  Abdominal pain, nausea, emesis, chronic constipation.  EXAM: CT ABDOMEN AND PELVIS WITH CONTRAST  TECHNIQUE: Multidetector CT imaging of the abdomen and pelvis was performed using the standard protocol following bolus administration of intravenous contrast.  CONTRAST:  159mL OMNIPAQUE IOHEXOL 300 MG/ML  SOLN  COMPARISON:  None.  FINDINGS: Lung bases clear.  Normal heart size.  No appreciable abnormality of the liver. Gallbladder not visualized and may be contracted or absent. No biliary ductal dilatation. No appreciable abnormality of the pancreas, spleen, adrenal glands, kidneys with the exception of a nonobstructing interpolar left renal stone.  Moderate stool burden. Appendix not identified. Small bowel loops are of normal course and caliber. No free intraperitoneal air. No lymphadenopathy.  Normal caliber aorta and branch vessels. Scattered atherosclerotic disease.  Heterogeneous uterine enhancement. Corpus luteal cyst on the left. Small physiologic appearing cyst on the right. Thin walled bladder.  No acute osseous finding.  IMPRESSION: Moderate stool burden.  Bowel gas pattern nonobstructed.  Nonobstructing left renal stone.  Gallbladder contracted or  absent.  Appendix not identified to exclude appendicitis. Trace fluid within the right lower quadrant and adnexa is nonspecific however favored to be physiologic.  Heterogeneous uterine enhancement may reflect underlying fibroids.   Electronically Signed   By: Carlos Levering M.D.   On: 11/21/2013 00:53    1315:  Pt requesting "something stronger for pain than motrin" and "a work note." Will tx URI symptomatically. Will rx clindamycin for salivary gland edema and tenderness. Wants to go home now. Dx and testing d/w pt.  Questions answered.  Verb understanding, agreeable to d/c home with outpt f/u.   Alfonzo Feller, DO 12/04/13 1213

## 2013-12-02 NOTE — Discharge Instructions (Signed)
°Emergency Department Resource Guide °1) Find a Doctor and Pay Out of Pocket °Although you won't have to find out who is covered by your insurance plan, it is a good idea to ask around and get recommendations. You will then need to call the office and see if the doctor you have chosen will accept you as a new patient and what types of options they offer for patients who are self-pay. Some doctors offer discounts or will set up payment plans for their patients who do not have insurance, but you will need to ask so you aren't surprised when you get to your appointment. ° °2) Contact Your Local Health Department °Not all health departments have doctors that can see patients for sick visits, but many do, so it is worth a call to see if yours does. If you don't know where your local health department is, you can check in your phone book. The CDC also has a tool to help you locate your state's health department, and many state websites also have listings of all of their local health departments. ° °3) Find a Walk-in Clinic °If your illness is not likely to be very severe or complicated, you may want to try a walk in clinic. These are popping up all over the country in pharmacies, drugstores, and shopping centers. They're usually staffed by nurse practitioners or physician assistants that have been trained to treat common illnesses and complaints. They're usually fairly quick and inexpensive. However, if you have serious medical issues or chronic medical problems, these are probably not your best option. ° °No Primary Care Doctor: °- Call [facility] at  832-8000 - they can help you locate a primary care doctor that  accepts your insurance, provides certain services, etc. °- Physician Referral Service- 1-800-533-3463 ° °Chronic Pain Problems: °Organization         Address  Phone   Notes  °[facility]  (336) 297-2271 Patients need to be referred by their primary care doctor.  ° °Medication  Assistance: °Organization         Address  Phone   Notes  °[place] [facility] [address] °[place], [place] [address] (336) 641-8030 --Must be a resident of [place] °-- Must have NO insurance coverage whatsoever (no Medicaid/ Medicare, etc.) °-- The pt. MUST have a primary care doctor that directs their care regularly and follows them in the community °  °[facility]  (866) 331-1348   °[employer]  (888) 892-1162   ° °Agencies that provide inexpensive medical care: °Organization         Address  Phone   Notes  °[facility]  (336) 832-8035   °[facility]    (336) 832-7272   °[facility] [address] °[place], [place] [address] (336) 832-4777   °[facility] [address], °[place] (336) 271-4999   °[facility]    (336) 373-0678   °[facility]    (336) 272-1050   °[facility] ° [address], [place] Phone:  (336) 832-4444, Fax:  (336) 832-4440 Hours of Operation:  9 am - 6 pm, M-F.  Also accepts Medicaid/Medicare and self-pay.  ° [facility] ° [address], [place] Phone: (336) 832-3150, Fax: (336) 832-3151. Hours of Operation:  8:30 am - 5:30 pm, M-F.  Also accepts Medicaid and self-pay.  °[facility] [place] [address], [place] Phone: (336) 878-6027   °[facility] [address], [place], [place] (336)723-1848, Ext. 123 Mondays & Thursdays: 7-9 AM.  First 15 patients are seen on a first come, first serve basis. °  ° °Medicaid-accepting [place] Providers: ° °Organization         Address  Phone   Notes  °[facility] [address], [place] (336) 641-2100 Also accepts self-pay patients.  °[facility] [address], [place] ° (336) 856-9996   °[facility] [address], [place]  (336) 288-8857   °[facility] [address] [place] [address], [place] (336) 299-7000   °Veita Bland [address], [place]  ° (336) 373-1557 Only accepts [place] Access Medicaid patients after they have their name applied to their card.  ° °Self-Pay (no insurance) in [place]: ° °Organization         Address  Phone   Notes  °Sickle Cell Patients, [facility] [address], [place] (336) 832-1970   °[facility] [address], [place] (336) 832-4400   °[facility] [place] ° [address], [place] (336) 992-4800   °[facility]/Dr. Osei-Bonsu ° [address] [place] [address], [place] or [address], [place] (336) 841-8500 Phone number for both [place] and [place] locations is the same.  °[facility] [address], [place] (336) 299-0000   °[facility] [address] [place] [address], [place] or [address] (336) 852-7530 °(336) 878-2260   °[facility] [address], [place] (336) 350-1642, phone; (336) 294-5005, fax Sees patients 1st and 3rd Saturday of every month.  Must not qualify for public or private insurance (i.e. Medicaid, Medicare, [place] Health Choice, Veterans' Benefits) • Household income should be no more than 200% of the poverty level •The clinic cannot treat you if you are pregnant or think you are pregnant • Sexually transmitted diseases are not treated at the clinic.  ° ° °Dental Care: °Organization         Address  Phone  Notes  °[place] [facility] [address],  (336) 641-6152 Accepts children up to age 21 who are enrolled in Medicaid or [place] Health Choice; pregnant women with a Medicaid card; and children who have applied for Medicaid or [place] Health Choice, but were declined, whose parents can pay a reduced fee at time of service.  °[place]  [facility] [place]  [address], [place] (336) 641-7733 Accepts children up to age 21 who are enrolled in Medicaid or [place] Health Choice; pregnant women with a Medicaid card; and children who have applied for Medicaid or  Health Choice, but were declined, whose parents can pay a reduced fee at time of service.  °[facility] ° [address],  (336) 641-4533 Patients are seen by appointment only. Walk-ins are not accepted. [facility] will see patients 18 years of age and older. °Monday - Tuesday (8am-5pm) °Most Wednesdays (8:30-5pm) °$30 per visit, cash only  °[facility] ° [address], [place] (336) 641-4533 Patients are seen by appointment only. Walk-ins are not accepted. [facility] will see patients 18 years of age and older. °One   Wednesday Evening (Monthly: Volunteer Based).  $30 per visit, cash only  °[facility]  (919) 537-3737 for adults; Children under age 4, call Graduate Pediatric Dentistry at ([phone]) [phone]. Children aged 4-14, please call (919) [phone] to request a pediatric application. ° Dental services are provided in all areas of dental care including fillings, crowns and bridges, complete and partial dentures, implants, gum treatment, root canals, and extractions. Preventive care is also provided. Treatment is provided to both adults and children. °Patients are selected via a lottery and there is often a waiting list. °  °[facility] [address], °[place] ° (336) 763-8833 [email or web address] °  °[facility] [address], [place], [place] (336)723-1848, Ext. 123 Second and Fourth Thursday of each month, opens at 6:30 AM; Clinic ends at 9 AM.  Patients are seen on a first-come first-served basis, and a limited number are seen during each clinic.  ° °[facility] ° [address], [place], [place] (336) 723-7904    Eligibility Requirements °You must have lived in [place], [place], or [place] counties for at least the last three months. °  You cannot be eligible for state or federal sponsored healthcare insurance, including Veterans Administration, Medicaid, or Medicare. °  You generally cannot be eligible for healthcare insurance through your employer.  °  How to apply: °Eligibility screenings are held every Tuesday and Wednesday afternoon from 1:00 pm until 4:00 pm. You do not need an appointment for the interview!  °[facility] [address], [place], [place] 336-631-2330   °[facility]  336-342-8273   °[place] [facility]  336-703-3100   °[facility]  336-570-6415   ° °Behavioral Health Resources in the Community: °Intensive Outpatient Programs °Organization         Address  Phone  Notes  °[facility] [address], [place], [place] 336-878-6098   °[facility] [address], [place], [place] 336-832-9800   °[facility] [address], [place], [place] ° 336-882-2125   °[facility] [address],  °[place], [place] 1-800-853-5163 or 336-641-4981   °Substance Abuse Resources °Organization         Address  Phone  Notes  °[facility]  336-882-2125   °[facility]  336-784-9470   °[facility]  336-285-9073   °[facility]  336-845-3988   °Residential & Outpatient Substance Abuse Program  1-800-659-3381   °Psychological Services °Organization         Address  Phone  Notes  °[facility]  336- 832-9600   °[facility]  336- 378-7881   °[facility] [address], [place] 1-800-853-5163 or 336-641-4981   ° °Mobile Crisis Teams °Organization         Address  Phone  Notes  °[facility], Mobile Crisis Care Unit  1-877-626-1772   °Assertive °[facility] ° [address]  [place], [place] 336-834-9664   °Sharon DeEsch [address] °[place] 336-554-5454   ° °Self-Help/Support Groups °Organization         Address  Phone             Notes  °[facility]  - variety of support groups  336- 373-1402 Call for more information  °Narcotics Anonymous (NA), [facility] [address], °[place]  2 meetings at this location  ° °  Residential Treatment Programs Organization         Address  Phone  Notes  [facility] [address]    [place]  1-(772)049-3100   [facility]  [address], [place] [address], [place], [place]   [place], [place] (201) 781-7799 Admissions: 8am-3pm M-F  [facility] [place] [address]    [place], [place] 491-791-5056   [facility] [address] [place], [place], [place]   [facility] [address],  [place], [place]   [facility] Intensive Outpatient [address], [place], [place]   [facility] ([place] [place]  [place], [place] 1-(225)095-1363 or 480-135-6471   [facility] [address] [place], [place] Accepts Medicaid  [facility] [address]  [place] 1-469-160-8250 Substance Abuse/Addiction Treatment   [facility] Organization         Address  Phone  Notes  [facility]  2396449166   Domenic Schwab, PhD [address] [place], [place]   (616)407-9821 or 812-124-4983   [place], [place] [phone]   [facility] [address], [place], [place] (712) 002-4565 Insurance/Medicaid/sponsorship through [facility] [address]                                    [place], [place] 631-510-9800 [place] [address], [place] (845)[phone]    Dr. Adele Schilder  770-738-0454   [facility] Dept. 1) [address], [place] 2) [place] 3)  [address], [place] (818) [phone]  (205)[phone]   [place] 540-607-9531 or 804-346-5705 (After Hours)      Take over the counter decongestant (such as sudafed), as directed on packaging, for the next week.  Use over the counter normal saline nasal spray, as instructed in the Emergency Department, several times per day for the next 2 weeks.  Take the prescriptions as directed.  Call your regular medical doctor and the ENT doctor tomorrow to schedule a follow up appointment within the next 2 to 3 days.  Return to the Emergency Department immediately sooner if worsening.

## 2013-12-02 NOTE — ED Notes (Signed)
Pt states that she has been having NV, cough, nasal congestion and body aches x 2 days.

## 2013-12-04 LAB — CULTURE, GROUP A STREP

## 2013-12-22 ENCOUNTER — Emergency Department (HOSPITAL_COMMUNITY)
Admission: EM | Admit: 2013-12-22 | Discharge: 2013-12-22 | Disposition: A | Discharge status: Home / Self Care | Payer: PRIVATE HEALTH INSURANCE | Attending: Emergency Medicine | Admitting: Emergency Medicine

## 2013-12-22 ENCOUNTER — Encounter (HOSPITAL_COMMUNITY): Payer: Self-pay | Admitting: Emergency Medicine

## 2013-12-22 DIAGNOSIS — Z792 Long term (current) use of antibiotics: Secondary | ICD-10-CM | POA: Insufficient documentation

## 2013-12-22 DIAGNOSIS — R1110 Vomiting, unspecified: Secondary | ICD-10-CM | POA: Insufficient documentation

## 2013-12-22 DIAGNOSIS — Z79899 Other long term (current) drug therapy: Secondary | ICD-10-CM | POA: Insufficient documentation

## 2013-12-22 DIAGNOSIS — I10 Essential (primary) hypertension: Secondary | ICD-10-CM | POA: Insufficient documentation

## 2013-12-22 MED ORDER — CLONIDINE HCL 0.2 MG PO TABS
0.2000 mg | ORAL_TABLET | Freq: Two times a day (BID) | ORAL | Status: DC
Start: 2013-12-22 — End: 2013-12-30

## 2013-12-22 NOTE — ED Provider Notes (Signed)
CSN: [id number]     Arrival date & time 12/22/13  0710 History   First MD Initiated Contact with Patient 12/22/13 [phone]     Chief Complaint  Patient presents with  . Medication Refill     (Consider location/radiation/quality/duration/timing/severity/associated sxs/prior Treatment) HPI Comments: 48 year old female presents needing a refill of her prescription blood pressure medicine. She states normally she takes clonidine 0.2 mg twice per day. She's been on this for last 2 years. She recently moved to [facility] from [place] and does not have a new PCP until March 29. She called her PCPs office and they told her to come to the ER for refill if needed because it not seen her. Patient states she last took her medicine 3 days ago until she found a stray clonidine pill in her purse and took it this morning. She states earlier after she's not been taking her medicine she felt sick to her stomach and vomited a couple times. She had earlier. She now feels significantly improved and she took her blood pressure medicine this morning. Denies any chest pain, blurry vision, current headache, or vomiting. No shortness of breath. She states she's been tried on multiple blood pressure medicines in the past but is now currently only on clonidine.   Past Medical History  Diagnosis Date  . Hypertension    History reviewed. No pertinent past surgical history. No family history on file. History  Substance Use Topics  . Smoking status: Never Smoker   . Smokeless tobacco: Not on file  . Alcohol Use: No   OB History   Grav Para Term Preterm Abortions TAB SAB Ect Mult Living                 Review of Systems  Constitutional: Negative for fever.  Eyes: Negative for visual disturbance.  Respiratory: Negative for shortness of breath.   Cardiovascular: Negative for chest pain and leg swelling.  Gastrointestinal: Positive for vomiting (most recently yesterday). Negative for nausea.  Neurological: Negative for  weakness, numbness and headaches.  All other systems reviewed and are negative.      Allergies  Review of patient's allergies indicates no known allergies.  Home Medications   Current Outpatient Rx  Name  Route  Sig  Dispense  Refill  . benzonatate (TESSALON) 100 MG capsule   Oral   Take 1 capsule (100 mg total) by mouth 3 (three) times daily as needed for cough.   15 capsule   0   . clindamycin (CLEOCIN) 150 MG capsule      3 tabs PO TID x 10 days   90 capsule   0   . cloNIDine (CATAPRES) 0.2 MG tablet   Oral   Take 1 tablet (0.2 mg total) by mouth 2 (two) times daily.   60 tablet   0   . HYDROcodone-acetaminophen (NORCO/VICODIN) 5-325 MG per tablet      1 or 2 tabs PO q6 hours prn pain   20 tablet   0    BP 138/99  Pulse 80  Temp(Src) 98.2 F (36.8 C) (Oral)  Resp 20  SpO2 100%  LMP 12/19/2013 Physical Exam  Nursing note and vitals reviewed. Constitutional: She is oriented to person, place, and time. She appears well-developed and well-nourished. No distress.  HENT:  Head: Normocephalic and atraumatic.  Right Ear: External ear normal.  Left Ear: External ear normal.  Nose: Nose normal.  Eyes: Right eye exhibits no discharge. Left eye exhibits no discharge.  Cardiovascular: Normal  rate, regular rhythm and normal heart sounds.   Pulmonary/Chest: Effort normal and breath sounds normal. She has no rales.  Abdominal: Soft. She exhibits no distension. There is no tenderness.  Musculoskeletal: She exhibits no edema.  Neurological: She is alert and oriented to person, place, and time.  Skin: Skin is warm and dry.    ED Course  Procedures (including critical care time) Labs Review Labs Reviewed - No data to display Imaging Review No results found.   EKG Interpretation None      MDM   Final diagnoses:  Hypertension    Patient has no current complaints except needing the refill of clonidine. She states its BID instead of the TID listed in  computer. BP normal now, took a dose about 1 hour PTA from last pill she had. That seems to have resolved all of her other sx. As she is currently asymptomatic and has normal BP, I feel this can be refilled and no further workup indicated at this time.    Ephraim Hamburger, MD 12/22/13 [phone]

## 2013-12-22 NOTE — ED Notes (Signed)
Pt from home requesting BP med refill. Pt reports that she has been w/o BP meds for 3 days. Pt takes Clonidine 2 mg tid. Pt states that she "gets sick" when she is w/o meds. Pt does not have PCP appt until March 29th. Pt is A&O and in NAD

## 2013-12-30 ENCOUNTER — Emergency Department (HOSPITAL_COMMUNITY)
Admission: EM | Admit: 2013-12-30 | Discharge: 2013-12-30 | Disposition: A | Discharge status: Home / Self Care | Payer: Self-pay | Attending: Emergency Medicine | Admitting: Emergency Medicine

## 2013-12-30 ENCOUNTER — Emergency Department (HOSPITAL_COMMUNITY): Payer: PRIVATE HEALTH INSURANCE

## 2013-12-30 ENCOUNTER — Encounter (HOSPITAL_COMMUNITY): Payer: Self-pay | Admitting: Emergency Medicine

## 2013-12-30 DIAGNOSIS — N83209 Unspecified ovarian cyst, unspecified side: Secondary | ICD-10-CM | POA: Insufficient documentation

## 2013-12-30 DIAGNOSIS — I10 Essential (primary) hypertension: Secondary | ICD-10-CM | POA: Insufficient documentation

## 2013-12-30 DIAGNOSIS — N946 Dysmenorrhea, unspecified: Secondary | ICD-10-CM | POA: Insufficient documentation

## 2013-12-30 DIAGNOSIS — Z79899 Other long term (current) drug therapy: Secondary | ICD-10-CM | POA: Insufficient documentation

## 2013-12-30 DIAGNOSIS — Z3202 Encounter for pregnancy test, result negative: Secondary | ICD-10-CM | POA: Insufficient documentation

## 2013-12-30 HISTORY — DX: Dysmenorrhea, unspecified: N94.6

## 2013-12-30 LAB — CBC WITH DIFFERENTIAL/PLATELET
BASOS ABS: 0 10*3/uL (ref 0.0–0.1)
BASOS PCT: 1 % (ref 0–1)
EOS ABS: 0.2 10*3/uL (ref 0.0–0.7)
Eosinophils Relative: 5 % (ref 0–5)
HCT: 36.9 % (ref 36.0–46.0)
Hemoglobin: 12.7 g/dL (ref 12.0–15.0)
Lymphocytes Relative: 39 % (ref 12–46)
Lymphs Abs: 1.7 10*3/uL (ref 0.7–4.0)
MCH: 30.3 pg (ref 26.0–34.0)
MCHC: 34.4 g/dL (ref 30.0–36.0)
MCV: 88.1 fL (ref 78.0–100.0)
Monocytes Absolute: 0.2 10*3/uL (ref 0.1–1.0)
Monocytes Relative: 5 % (ref 3–12)
NEUTROS ABS: 2.1 10*3/uL (ref 1.7–7.7)
NEUTROS PCT: 50 % (ref 43–77)
PLATELETS: 281 10*3/uL (ref 150–400)
RBC: 4.19 MIL/uL (ref 3.87–5.11)
RDW: 14 % (ref 11.5–15.5)
WBC: 4.3 10*3/uL (ref 4.0–10.5)

## 2013-12-30 LAB — COMPREHENSIVE METABOLIC PANEL
ALBUMIN: 3.8 g/dL (ref 3.5–5.2)
ALK PHOS: 75 U/L (ref 39–117)
ALT: 14 U/L (ref 0–35)
AST: 28 U/L (ref 0–37)
BUN: 6 mg/dL (ref 6–23)
CHLORIDE: 105 meq/L (ref 96–112)
CO2: 22 mEq/L (ref 19–32)
Calcium: 8.9 mg/dL (ref 8.4–10.5)
Creatinine, Ser: 0.63 mg/dL (ref 0.50–1.10)
GFR calc Af Amer: >90 mL/min (ref >90)
GFR calc non Af Amer: >90 mL/min (ref >90)
Glucose, Bld: 99 mg/dL (ref 70–99)
POTASSIUM: 3.9 meq/L (ref 3.7–5.3)
Sodium: 140 mEq/L (ref 137–147)
Total Bilirubin: <0.2 mg/dL — ABNORMAL LOW (ref 0.3–1.2)
Total Protein: 7.1 g/dL (ref 6.0–8.3)

## 2013-12-30 LAB — URINE MICROSCOPIC-ADD ON

## 2013-12-30 LAB — URINALYSIS, ROUTINE W REFLEX MICROSCOPIC
Bilirubin Urine: NEGATIVE
Glucose, UA: NEGATIVE mg/dL
KETONES UR: NEGATIVE mg/dL
Nitrite: NEGATIVE
PH: 6.5 (ref 5.0–8.0)
PROTEIN: NEGATIVE mg/dL
Specific Gravity, Urine: 1.011 (ref 1.005–1.030)
Urobilinogen, UA: 1 mg/dL (ref 0.0–1.0)

## 2013-12-30 LAB — WET PREP, GENITAL
TRICH WET PREP: NONE SEEN
YEAST WET PREP: NONE SEEN

## 2013-12-30 LAB — POC URINE PREG, ED: Preg Test, Ur: NEGATIVE

## 2013-12-30 MED ORDER — HYDROMORPHONE HCL PF 1 MG/ML IJ SOLN
1.0000 mg | Freq: Once | INTRAMUSCULAR | Status: AC
  Administered 2013-12-30: 1 mg via INTRAVENOUS
  Filled 2013-12-30: qty 1

## 2013-12-30 MED ORDER — KETOROLAC TROMETHAMINE 60 MG/2ML IM SOLN
60.0000 mg | Freq: Once | INTRAMUSCULAR | Status: AC
  Administered 2013-12-30: 60 mg via INTRAMUSCULAR
  Filled 2013-12-30: qty 2

## 2013-12-30 MED ORDER — OXYCODONE-ACETAMINOPHEN 5-325 MG PO TABS: 1.0000 | ORAL_TABLET | Freq: Four times a day (QID) | ORAL | Status: DC | PRN

## 2013-12-30 MED ORDER — PROMETHAZINE HCL 25 MG PO TABS: 25.0000 mg | ORAL_TABLET | Freq: Four times a day (QID) | ORAL | Status: DC | PRN

## 2013-12-30 MED ORDER — OXYCODONE-ACETAMINOPHEN 5-325 MG PO TABS
1.0000 | ORAL_TABLET | Freq: Once | ORAL | Status: AC
  Administered 2013-12-30: 1 via ORAL
  Filled 2013-12-30: qty 1

## 2013-12-30 MED ORDER — ONDANSETRON HCL 4 MG/2ML IJ SOLN
4.0000 mg | Freq: Once | INTRAMUSCULAR | Status: AC
  Administered 2013-12-30: 4 mg via INTRAVENOUS
  Filled 2013-12-30: qty 2

## 2013-12-30 MED ORDER — ONDANSETRON 4 MG PO TBDP
4.0000 mg | ORAL_TABLET | Freq: Once | ORAL | Status: AC
  Administered 2013-12-30: 4 mg via ORAL
  Filled 2013-12-30: qty 1

## 2013-12-30 MED ORDER — ONDANSETRON HCL 4 MG/2ML IJ SOLN
4.0000 mg | Freq: Once | INTRAMUSCULAR | Status: AC
  Administered 2013-12-30: 4 mg via INTRAMUSCULAR
  Filled 2013-12-30: qty 2

## 2013-12-30 MED ORDER — SODIUM CHLORIDE 0.9 % IV BOLUS (SEPSIS)
1000.0000 mL | Freq: Once | INTRAVENOUS | Status: AC
  Administered 2013-12-30: 1000 mL via INTRAVENOUS

## 2013-12-30 MED ORDER — CLONIDINE HCL 0.2 MG PO TABS
0.2000 mg | ORAL_TABLET | Freq: Two times a day (BID) | ORAL | Status: DC
Start: 2013-12-30 — End: 2014-01-28

## 2013-12-30 MED ORDER — HYDROMORPHONE HCL PF 1 MG/ML IJ SOLN
1.0000 mg | Freq: Once | INTRAMUSCULAR | Status: AC
  Administered 2013-12-30: 1 mg via INTRAMUSCULAR
  Filled 2013-12-30: qty 1

## 2013-12-30 NOTE — Discharge Instructions (Signed)

## 2013-12-30 NOTE — ED Notes (Signed)
She states she is having "real bad period cramps"; and that she has had those since 48 years of age.  She states she routinely takes 1600mg  of Ibuprofen for this, which she did at about 3 this morning.  She states she has had a few episodes of vomiting today also.  She states she has also taken a Tramadol, "but I threw that up too".  She is in no distress.

## 2013-12-30 NOTE — ED Notes (Signed)
Patient transported to Ultrasound 

## 2013-12-30 NOTE — ED Provider Notes (Addendum)
CSN: [id number]     Arrival date & time 12/30/13  0750 History   First MD Initiated Contact with Patient 12/30/13 0831     Chief Complaint  Patient presents with  . Menstrual Problem  . Emesis     (Consider location/radiation/quality/duration/timing/severity/associated sxs/prior Treatment) Patient is a 48 y.o. female presenting with vomiting. The history is provided by the patient.  Emesis Severity:  Mild Duration:  10 hours Timing:  Sporadic Number of daily episodes:  2 Quality:  Stomach contents Progression:  Unchanged Associated symptoms: abdominal pain   Associated symptoms: no chills, no diarrhea, no fever and no URI   Abdominal pain:    Pain location: lower pelvic pain diffusely.   Quality:  Cramping, sharp and gnawing   Severity:  Severe   Onset quality:  Gradual   Duration:  10 hours   Timing:  Constant   Progression:  Worsening   Chronicity:  Recurrent Risk factors: no prior abdominal surgery, no sick contacts and no suspect food intake   Risk factors comment:  Hx of dysmenorrhea from fibriods and states for years has had painful periods but bad today.  menses started today.   Past Medical History  Diagnosis Date  . Hypertension   . Dysmenorrhea    History reviewed. No pertinent past surgical history. No family history on file. History  Substance Use Topics  . Smoking status: Never Smoker   . Smokeless tobacco: Not on file  . Alcohol Use: No   OB History   Grav Para Term Preterm Abortions TAB SAB Ect Mult Living                 Review of Systems  Constitutional: Negative for chills.  Gastrointestinal: Positive for vomiting and abdominal pain. Negative for diarrhea.  Genitourinary: Positive for vaginal bleeding, vaginal pain, menstrual problem and pelvic pain. Negative for dysuria, frequency, flank pain and vaginal discharge.  All other systems reviewed and are negative.      Allergies  Review of patient's allergies indicates no known  allergies.  Home Medications   Current Outpatient Rx  Name  Route  Sig  Dispense  Refill  . cloNIDine (CATAPRES) 0.2 MG tablet   Oral   Take 1 tablet (0.2 mg total) by mouth 2 (two) times daily.   60 tablet   0   . ibuprofen (ADVIL,MOTRIN) 800 MG tablet   Oral   Take 1,600 mg by mouth once.         . traMADol (ULTRAM) 50 MG tablet   Oral   Take 50 mg by mouth every 6 (six) hours as needed for severe pain.          BP 139/99  Pulse 71  Temp(Src) 98.3 F (36.8 C) (Oral)  Resp 16  SpO2 99%  LMP 12/19/2013 Physical Exam  Nursing note and vitals reviewed. Constitutional: She is oriented to person, place, and time. She appears well-developed and well-nourished. No distress.  HENT:  Head: Normocephalic and atraumatic.  Mouth/Throat: Oropharynx is clear and moist.  Eyes: Conjunctivae and EOM are normal. Pupils are equal, round, and reactive to light.  Neck: Normal range of motion. Neck supple.  Cardiovascular: Normal rate, regular rhythm and intact distal pulses.   No murmur heard. Pulmonary/Chest: Effort normal and breath sounds normal. No respiratory distress. She has no wheezes. She has no rales.  Abdominal: Soft. Normal appearance. She exhibits no distension. There is tenderness. There is no rebound, no guarding and no CVA tenderness.  Diffuse pelvic tenderness to palpation  Genitourinary: There is no rash or tenderness on the right labia. There is no rash or tenderness on the left labia. Uterus is tender. Cervix exhibits no discharge. Right adnexum displays no mass and no tenderness. Left adnexum displays no mass and no tenderness. There is bleeding around the vagina. No vaginal discharge found.  Musculoskeletal: Normal range of motion. She exhibits no edema and no tenderness.  Neurological: She is alert and oriented to person, place, and time.  Skin: Skin is warm and dry. No rash noted. No erythema.  Psychiatric: She has a normal mood and affect. Her behavior is  normal.    ED Course  Procedures (including critical care time) Labs Review Labs Reviewed  WET PREP, GENITAL - Abnormal; Notable for the following:    Clue Cells Wet Prep HPF POC FEW (*)    WBC, Wet Prep HPF POC FEW (*)    All other components within normal limits  URINALYSIS, ROUTINE W REFLEX MICROSCOPIC - Abnormal; Notable for the following:    APPearance TURBID (*)    Hgb urine dipstick LARGE (*)    Leukocytes, UA SMALL (*)    All other components within normal limits  URINE MICROSCOPIC-ADD ON - Abnormal; Notable for the following:    Squamous Epithelial / LPF MANY (*)    Bacteria, UA FEW (*)    All other components within normal limits  COMPREHENSIVE METABOLIC PANEL - Abnormal; Notable for the following:    Total Bilirubin <0.2 (*)    All other components within normal limits  GC/CHLAMYDIA PROBE AMP  CBC WITH DIFFERENTIAL  POC URINE PREG, ED   Imaging Review US Transvaginal Non-ob  12/30/2013   CLINICAL DATA:  Pelvic pain and bleeding  EXAM: TRANSABDOMINAL AND TRANSVAGINAL ULTRASOUND OF PELVIS  TECHNIQUE: Both transabdominal and transvaginal ultrasound examinations of the pelvis were performed. Transabdominal technique was performed for global imaging of the pelvis including uterus, ovaries, adnexal regions, and pelvic cul-de-sac. It was necessary to proceed with endovaginal exam following the transabdominal exam to visualize the endometrium and adnexal structures.  COMPARISON:  US PELVIS COMPLETE dated 12/30/2013  FINDINGS: Uterus  Measurements: 7.2 x 3.7 x 4.5 cm. The echotexture of the uterus is somewhat heterogeneous. Discrete fibroids are not demonstrated. There are nabothian cysts within the cervix.  Endometrium  Thickness: 9 mm. There is a focal area of hyper density within the endometrium which may reflect a polyp measuring up to 8 mm in diameter.  Right ovary  Measurements: 2.6 x 2.3 x 3.1 cm. There is a simple appearing cyst measuring 1.3 x 1.5 x 1.3 cm  Left ovary   Measurements: 2.7 x 1.8 x 2.0 cm. There is a simple appearing cyst measuring 1.4 x 1.2 x 1.1 cm. Also in the left ovary there is a hypoechoic focus measuring 1.2 x 0.7 x 0.8 cm which may reflect a hemorrhagic cyst.  Other findings  There is a tiny amount of free fluid in the pelvis. Prominent pelvic venous structures are present on the right.  IMPRESSION: 1. The uterus is normal in size with no evidence of fibroids. An endometrial polyp measuring 8 mm in greatest dimension is suspected. The overall endometrial thickness is 9 mm. 2. There are simple appearing cysts in both ovaries. There is a complex structure likely reflecting a hemorrhagic cyst in the left ovary. 3. There is a trace of free fluid in the cul de sac.   Electronically Signed   By: David  Martinique  On: 12/30/2013 12:37   US Pelvis Complete  12/30/2013   CLINICAL DATA:  Pelvic pain and bleeding  EXAM: TRANSABDOMINAL AND TRANSVAGINAL ULTRASOUND OF PELVIS  TECHNIQUE: Both transabdominal and transvaginal ultrasound examinations of the pelvis were performed. Transabdominal technique was performed for global imaging of the pelvis including uterus, ovaries, adnexal regions, and pelvic cul-de-sac. It was necessary to proceed with endovaginal exam following the transabdominal exam to visualize the endometrium and adnexal structures.  COMPARISON:  US PELVIS COMPLETE dated 12/30/2013  FINDINGS: Uterus  Measurements: 7.2 x 3.7 x 4.5 cm. The echotexture of the uterus is somewhat heterogeneous. Discrete fibroids are not demonstrated. There are nabothian cysts within the cervix.  Endometrium  Thickness: 9 mm. There is a focal area of hyper density within the endometrium which may reflect a polyp measuring up to 8 mm in diameter.  Right ovary  Measurements: 2.6 x 2.3 x 3.1 cm. There is a simple appearing cyst measuring 1.3 x 1.5 x 1.3 cm  Left ovary  Measurements: 2.7 x 1.8 x 2.0 cm. There is a simple appearing cyst measuring 1.4 x 1.2 x 1.1 cm. Also in the left ovary  there is a hypoechoic focus measuring 1.2 x 0.7 x 0.8 cm which may reflect a hemorrhagic cyst.  Other findings  There is a tiny amount of free fluid in the pelvis. Prominent pelvic venous structures are present on the right.  IMPRESSION: 1. The uterus is normal in size with no evidence of fibroids. An endometrial polyp measuring 8 mm in greatest dimension is suspected. The overall endometrial thickness is 9 mm. 2. There are simple appearing cysts in both ovaries. There is a complex structure likely reflecting a hemorrhagic cyst in the left ovary. 3. There is a trace of free fluid in the cul de sac.   Electronically Signed   By: David  Martinique   On: 12/30/2013 12:37     EKG Interpretation None      MDM   Final diagnoses:  Ovarian cyst    Patient presents with lower pelvic cramping that is most suggestive of menstrual cramping with started bleeding today. Patient has a history of uterine fibroids and dysmenorrhea. Patient recently had a CT within the last few months that showed uterine fibroids but otherwise no acute pathology. Patient attempted to take ibuprofen and one tramadol at home and the pain started last night however the pain is made her nauseated and she threw up. She denies being sexually active for the last 2 years has not had any vaginal discharge or dysuria. No prior abdominal surgeries and otherwise hemodynamically stable. Will treat pain.  UA, UPT, wet prep, gc/chlamydia pending.  No focal pain but diffuse.  Low suspicion for TOA or infectious process feel pt is have a severe case of dysmenorrhea.  Will give f/u with [facility] outpt clinic.  9:44 AM Pt vomited up pain meds and still having pain.  Will give IM.  Pelvic with mild dark bleeding and diffuse tenderness but no adnexal tenderness or PID.  10:50 AM UA contaminated without compelling evidence of UTI.  UPT neg.  Wet prep unremarkable.  After meds pt states pain is no better.  Will start an IV and CBC, CMP pending.  Also will  get transvaginal u/s to r/o other acute pathology.  1:43 PM Labs wnl.  U/S shows a hemorrhagic cyst in the left ovary which is most likely the cause for pain.  Despite 2 rounds of dilaudid pt feels that it has not helped  significantly with her pain and toradol was not helpful.  Discussed results with pt and recommended GYN f/u.  Also pt given po challenge.  Will d/c with po pain meds and pt request refill of BP med.  Blanchie Dessert, MD 12/30/13 [place], MD 12/30/13 1347

## 2013-12-31 ENCOUNTER — Inpatient Hospital Stay (HOSPITAL_COMMUNITY)
Admission: AD | Admit: 2013-12-31 | Discharge: 2013-12-31 | Disposition: A | Discharge status: Home / Self Care | Payer: No Typology Code available for payment source | Source: Ambulatory Visit | Attending: Obstetrics & Gynecology | Admitting: Obstetrics & Gynecology

## 2013-12-31 ENCOUNTER — Encounter (HOSPITAL_COMMUNITY): Payer: Self-pay | Admitting: *Deleted

## 2013-12-31 DIAGNOSIS — N840 Polyp of corpus uteri: Secondary | ICD-10-CM | POA: Insufficient documentation

## 2013-12-31 DIAGNOSIS — R109 Unspecified abdominal pain: Secondary | ICD-10-CM | POA: Insufficient documentation

## 2013-12-31 DIAGNOSIS — N898 Other specified noninflammatory disorders of vagina: Secondary | ICD-10-CM | POA: Insufficient documentation

## 2013-12-31 DIAGNOSIS — N390 Urinary tract infection, site not specified: Secondary | ICD-10-CM | POA: Insufficient documentation

## 2013-12-31 DIAGNOSIS — I10 Essential (primary) hypertension: Secondary | ICD-10-CM | POA: Insufficient documentation

## 2013-12-31 DIAGNOSIS — N83209 Unspecified ovarian cyst, unspecified side: Secondary | ICD-10-CM | POA: Insufficient documentation

## 2013-12-31 LAB — URINALYSIS, ROUTINE W REFLEX MICROSCOPIC
GLUCOSE, UA: NEGATIVE mg/dL
Ketones, ur: 15 mg/dL — AB
Nitrite: POSITIVE — AB
PH: 6.5 (ref 5.0–8.0)
Protein, ur: 100 mg/dL — AB
Specific Gravity, Urine: >1.03 — ABNORMAL HIGH (ref 1.005–1.030)
Urobilinogen, UA: 1 mg/dL (ref 0.0–1.0)

## 2013-12-31 LAB — URINE MICROSCOPIC-ADD ON

## 2013-12-31 LAB — GC/CHLAMYDIA PROBE AMP
CT Probe RNA: NEGATIVE
GC Probe RNA: NEGATIVE

## 2013-12-31 MED ORDER — CIPROFLOXACIN HCL 250 MG PO TABS: 250.0000 mg | ORAL_TABLET | Freq: Two times a day (BID) | ORAL | Status: DC

## 2013-12-31 MED ORDER — PHENAZOPYRIDINE HCL 200 MG PO TABS: 200.0000 mg | ORAL_TABLET | Freq: Three times a day (TID) | ORAL | Status: DC

## 2013-12-31 MED ORDER — KETOROLAC TROMETHAMINE 60 MG/2ML IM SOLN
60.0000 mg | Freq: Once | INTRAMUSCULAR | Status: AC
  Administered 2013-12-31: 60 mg via INTRAMUSCULAR
  Filled 2013-12-31: qty 2

## 2013-12-31 MED ORDER — HYDROMORPHONE HCL PF 1 MG/ML IJ SOLN
1.0000 mg | Freq: Once | INTRAMUSCULAR | Status: AC
  Administered 2013-12-31: 1 mg via INTRAMUSCULAR
  Filled 2013-12-31: qty 1

## 2013-12-31 MED ORDER — TRAMADOL HCL 50 MG PO TABS: 50.0000 mg | ORAL_TABLET | Freq: Four times a day (QID) | ORAL | Status: DC | PRN

## 2013-12-31 NOTE — MAU Note (Signed)
Pt. Experiencing right sided abdominal pain. LMP 12-28-13. Pt. States this a week early. Pt. Was unsure before period if she had a UTI due to pain when urinating. Pt. States this pain is worse than she has ever experienced. Pt. Was seen at [facility] yesterday and she was told she had a cyst. Also, pt. States IV medication helped yesterday but the pills they gave her to go home made her pain worse and did not help. Pt. States she is nauseated. Pt. States she ate a meal 2-3 days ago but has been snacking on crackers.

## 2013-12-31 NOTE — Discharge Instructions (Signed)
Ovarian Cyst An ovarian cyst is a sac filled with fluid or blood. This sac is attached to the ovary. Some cysts go away on their own. Other cysts need treatment.  HOME CARE   Only take medicine as told by your doctor.  Follow up with your doctor as told.  Get regular pelvic exams and Pap tests. GET HELP IF:  Your periods are late, not regular, or painful.  You stop having periods.  Your belly (abdominal) or pelvic pain does not go away.  Your belly becomes large or puffy (swollen).  You have a hard time peeing (totally emptying your bladder).  You have pressure on your bladder.  You have pain during sex.  You feel fullness, pressure, or discomfort in your belly.  You lose weight for no reason.  You feel sick most of the time.  You have a hard time pooping (constipation).  You do not feel like eating.  You develop pimples (acne).  You have an increase in hair on your body and face.  You are gaining weight for no reason.  You think you are pregnant. GET HELP RIGHT AWAY IF:   Your belly pain gets worse.  You feel sick to your stomach (nauseous), and you throw up (vomit).  You have a fever that comes on fast.  You have belly pain while pooping (bowel movement).  Your periods are heavier than usual. MAKE SURE YOU:   Understand these instructions.  Will watch your condition.  Will get help right away if you are not doing well or get worse. Document Released: 03/29/2008 Document Revised: 08/01/2013 Document Reviewed: 06/18/2013 [facility] Patient Information 2014 [place].

## 2013-12-31 NOTE — MAU Provider Note (Signed)
History     CSN: [id number]  Arrival date and time: 12/31/13 6761   First Provider Initiated Contact with Patient 12/31/13 2006      Chief Complaint  Patient presents with  . Abdominal Pain  . Vaginal Bleeding   HPI Ms. Regina Estes is a 48 y.o. G0P0 who presents to MAU today with complaint of lower abdominal pain. The patient was seen at [facility] yesterday and diagnosed with a hemorrhagic cyst. The patient was given Percocet which she took at home last night and states that it caused N/V. She has a history of dysmenorrhea for which she usually take 800 mg Ibuprofen. She last took ibuprofen prior to her ED visit yesterday with no relief. She states LMP of 12/28/13. She denies fever or diarrhea. Last BM was yesterday. She does report occassional chills. She denies UTI symptoms.   OB History   Grav Para Term Preterm Abortions TAB SAB Ect Mult Living                  Past Medical History  Diagnosis Date  . Hypertension   . Dysmenorrhea     Past Surgical History  Procedure Laterality Date  . Gallbladder surgery  2011  . Hand surgery  20 years ago    surgery due to infection per pt.    History reviewed. No pertinent family history.  History  Substance Use Topics  . Smoking status: Never Smoker   . Smokeless tobacco: Not on file  . Alcohol Use: No    Allergies: No Known Allergies  Prescriptions prior to admission  Medication Sig Dispense Refill  . cloNIDine (CATAPRES) 0.2 MG tablet Take 1 tablet (0.2 mg total) by mouth 2 (two) times daily.  60 tablet  1  . oxyCODONE-acetaminophen (PERCOCET/ROXICET) 5-325 MG per tablet Take 1 tablet by mouth every 6 (six) hours as needed.  15 tablet  0  . promethazine (PHENERGAN) 25 MG tablet Take 1 tablet (25 mg total) by mouth every 6 (six) hours as needed for nausea or vomiting.  30 tablet  0  . traMADol (ULTRAM) 50 MG tablet Take 50 mg by mouth every 6 (six) hours as needed for severe pain.      Marland Kitchen ibuprofen (ADVIL,MOTRIN) 800 MG tablet Take  1,600 mg by mouth once.        Review of Systems  Constitutional: Positive for chills. Negative for fever and malaise/fatigue.  Gastrointestinal: Positive for nausea, vomiting and abdominal pain. Negative for diarrhea and constipation.  Genitourinary: Negative for dysuria, urgency and frequency.       + vaginal bleeding   Physical Exam   Blood pressure 149/96, pulse 75, temperature 98.6 F (37 C), temperature source Oral, resp. rate 18, height 5\' 2"  (1.575 m), weight 127 lb (57.607 kg), last menstrual period 12/28/2013, SpO2 100.00%.  Physical Exam  Constitutional: She is oriented to person, place, and time. She appears well-developed and well-nourished. No distress.  Patient is uncomfortable  HENT:  Head: Normocephalic and atraumatic.  Cardiovascular: Normal rate, regular rhythm and normal heart sounds.   Respiratory: Effort normal and breath sounds normal. No respiratory distress.  GI: Soft. Bowel sounds are normal. She exhibits no distension and no mass. There is tenderness (diffuse tenderness to palpation of the abdomen worse in the lower abdomen bilaterally). There is no rebound and no guarding.  Neurological: She is alert and oriented to person, place, and time.  Skin: Skin is warm and dry. No erythema.  Psychiatric: She has a normal  mood and affect.   Results for orders placed during the hospital encounter of 12/31/13 (from the past 24 hour(s))  URINALYSIS, ROUTINE W REFLEX MICROSCOPIC     Status: Abnormal   Collection Time    12/31/13  6:50 PM      Result Value Ref Range   Color, Urine YELLOW  YELLOW   APPearance TURBID (*) CLEAR   Specific Gravity, Urine >1.030 (*) 1.005 - 1.030   pH 6.5  5.0 - 8.0   Glucose, UA NEGATIVE  NEGATIVE mg/dL   Hgb urine dipstick LARGE (*) NEGATIVE   Bilirubin Urine SMALL (*) NEGATIVE   Ketones, ur 15 (*) NEGATIVE mg/dL   Protein, ur 100 (*) NEGATIVE mg/dL   Urobilinogen, UA 1.0  0.0 - 1.0 mg/dL   Nitrite POSITIVE (*) NEGATIVE    Leukocytes, UA TRACE (*) NEGATIVE  URINE MICROSCOPIC-ADD ON     Status: Abnormal   Collection Time    12/31/13  6:50 PM      Result Value Ref Range   Squamous Epithelial / LPF FEW (*) RARE   WBC, UA 3-6  <3 WBC/hpf   RBC / HPF TOO NUMEROUS TO COUNT  <3 RBC/hpf   Bacteria, UA FEW (*) RARE    No results found for this or any previous visit (from the past 24 hour(s)).  US Transvaginal Non-ob  12/30/2013   CLINICAL DATA:  Pelvic pain and bleeding  EXAM: TRANSABDOMINAL AND TRANSVAGINAL ULTRASOUND OF PELVIS  TECHNIQUE: Both transabdominal and transvaginal ultrasound examinations of the pelvis were performed. Transabdominal technique was performed for global imaging of the pelvis including uterus, ovaries, adnexal regions, and pelvic cul-de-sac. It was necessary to proceed with endovaginal exam following the transabdominal exam to visualize the endometrium and adnexal structures.  COMPARISON:  US PELVIS COMPLETE dated 12/30/2013  FINDINGS: Uterus  Measurements: 7.2 x 3.7 x 4.5 cm. The echotexture of the uterus is somewhat heterogeneous. Discrete fibroids are not demonstrated. There are nabothian cysts within the cervix.  Endometrium  Thickness: 9 mm. There is a focal area of hyper density within the endometrium which may reflect a polyp measuring up to 8 mm in diameter.  Right ovary  Measurements: 2.6 x 2.3 x 3.1 cm. There is a simple appearing cyst measuring 1.3 x 1.5 x 1.3 cm  Left ovary  Measurements: 2.7 x 1.8 x 2.0 cm. There is a simple appearing cyst measuring 1.4 x 1.2 x 1.1 cm. Also in the left ovary there is a hypoechoic focus measuring 1.2 x 0.7 x 0.8 cm which may reflect a hemorrhagic cyst.  Other findings  There is a tiny amount of free fluid in the pelvis. Prominent pelvic venous structures are present on the right.  IMPRESSION: 1. The uterus is normal in size with no evidence of fibroids. An endometrial polyp measuring 8 mm in greatest dimension is suspected. The overall endometrial thickness is 9  mm. 2. There are simple appearing cysts in both ovaries. There is a complex structure likely reflecting a hemorrhagic cyst in the left ovary. 3. There is a trace of free fluid in the cul de sac.   Electronically Signed   By: David  Martinique   On: 12/30/2013 12:37   US Pelvis Complete  12/30/2013   CLINICAL DATA:  Pelvic pain and bleeding  EXAM: TRANSABDOMINAL AND TRANSVAGINAL ULTRASOUND OF PELVIS  TECHNIQUE: Both transabdominal and transvaginal ultrasound examinations of the pelvis were performed. Transabdominal technique was performed for global imaging of the pelvis including uterus, ovaries, adnexal  regions, and pelvic cul-de-sac. It was necessary to proceed with endovaginal exam following the transabdominal exam to visualize the endometrium and adnexal structures.  COMPARISON:  US PELVIS COMPLETE dated 12/30/2013  FINDINGS: Uterus  Measurements: 7.2 x 3.7 x 4.5 cm. The echotexture of the uterus is somewhat heterogeneous. Discrete fibroids are not demonstrated. There are nabothian cysts within the cervix.  Endometrium  Thickness: 9 mm. There is a focal area of hyper density within the endometrium which may reflect a polyp measuring up to 8 mm in diameter.  Right ovary  Measurements: 2.6 x 2.3 x 3.1 cm. There is a simple appearing cyst measuring 1.3 x 1.5 x 1.3 cm  Left ovary  Measurements: 2.7 x 1.8 x 2.0 cm. There is a simple appearing cyst measuring 1.4 x 1.2 x 1.1 cm. Also in the left ovary there is a hypoechoic focus measuring 1.2 x 0.7 x 0.8 cm which may reflect a hemorrhagic cyst.  Other findings  There is a tiny amount of free fluid in the pelvis. Prominent pelvic venous structures are present on the right.  IMPRESSION: 1. The uterus is normal in size with no evidence of fibroids. An endometrial polyp measuring 8 mm in greatest dimension is suspected. The overall endometrial thickness is 9 mm. 2. There are simple appearing cysts in both ovaries. There is a complex structure likely reflecting a hemorrhagic  cyst in the left ovary. 3. There is a trace of free fluid in the cul de sac.   Electronically Signed   By: David  Martinique   On: 12/30/2013 12:37    MAU Course  Procedures None  MDM Patient had negative GC/Chlamyida yesterday [place] yesterday showed hemorrhagic cyst 1 mg IM dilaudid given in MAU - patient denies improvement in pain per RN 60 mg Toradol offered and patient declines. Patient states that "first shot" did help somewhat and she is ready to go home  Assessment and Plan  A: Ovarian cysts Endometrial polyp UTI  P: Discharge home Rx for Cipro, Pyridium and Tramadol given to patient Patient advised to try Percocet as previously prescribed with food or Ibuprofen PRN for pain Warning signs for Pyelonephritis discussed Patient encouraged to keep scheduled appointment with PCP on 01/10/14 Patient may return to MAU as needed or if her condition were to change or worsen  Farris Has, PA-C  12/31/2013, 8:06 PM

## 2013-12-31 NOTE — MAU Note (Signed)
Pt reports lower abd pain and rt lower abd pain, pain started yesterday and she was seen at [facility] and was told she had a cyst. States they gave her something for pain and something for nausea and she says she isn't able to eat anything because she vomits (pt is eating in triage).

## 2013-12-31 NOTE — MAU Note (Signed)
Pt called, not in lobby 

## 2014-01-28 ENCOUNTER — Emergency Department (HOSPITAL_COMMUNITY)
Admission: EM | Admit: 2014-01-28 | Discharge: 2014-01-28 | Disposition: A | Discharge status: Home / Self Care | Payer: PRIVATE HEALTH INSURANCE | Attending: Emergency Medicine | Admitting: Emergency Medicine

## 2014-01-28 ENCOUNTER — Encounter (HOSPITAL_COMMUNITY): Payer: Self-pay | Admitting: Emergency Medicine

## 2014-01-28 DIAGNOSIS — N938 Other specified abnormal uterine and vaginal bleeding: Secondary | ICD-10-CM | POA: Insufficient documentation

## 2014-01-28 DIAGNOSIS — R102 Pelvic and perineal pain: Secondary | ICD-10-CM

## 2014-01-28 DIAGNOSIS — N946 Dysmenorrhea, unspecified: Secondary | ICD-10-CM | POA: Insufficient documentation

## 2014-01-28 DIAGNOSIS — I10 Essential (primary) hypertension: Secondary | ICD-10-CM | POA: Insufficient documentation

## 2014-01-28 DIAGNOSIS — B9689 Other specified bacterial agents as the cause of diseases classified elsewhere: Secondary | ICD-10-CM

## 2014-01-28 DIAGNOSIS — Z3202 Encounter for pregnancy test, result negative: Secondary | ICD-10-CM | POA: Insufficient documentation

## 2014-01-28 DIAGNOSIS — Z79899 Other long term (current) drug therapy: Secondary | ICD-10-CM | POA: Insufficient documentation

## 2014-01-28 DIAGNOSIS — N760 Acute vaginitis: Secondary | ICD-10-CM | POA: Insufficient documentation

## 2014-01-28 DIAGNOSIS — R112 Nausea with vomiting, unspecified: Secondary | ICD-10-CM | POA: Insufficient documentation

## 2014-01-28 DIAGNOSIS — N949 Unspecified condition associated with female genital organs and menstrual cycle: Secondary | ICD-10-CM | POA: Insufficient documentation

## 2014-01-28 DIAGNOSIS — N898 Other specified noninflammatory disorders of vagina: Secondary | ICD-10-CM | POA: Insufficient documentation

## 2014-01-28 LAB — COMPREHENSIVE METABOLIC PANEL
ALT: 9 U/L (ref 0–35)
AST: 13 U/L (ref 0–37)
Albumin: 3.6 g/dL (ref 3.5–5.2)
Alkaline Phosphatase: 58 U/L (ref 39–117)
BUN: 10 mg/dL (ref 6–23)
CHLORIDE: 106 meq/L (ref 96–112)
CO2: 19 mEq/L (ref 19–32)
CREATININE: 0.69 mg/dL (ref 0.50–1.10)
Calcium: 9.1 mg/dL (ref 8.4–10.5)
GFR calc Af Amer: >90 mL/min (ref >90)
GFR calc non Af Amer: >90 mL/min (ref >90)
Glucose, Bld: 103 mg/dL — ABNORMAL HIGH (ref 70–99)
POTASSIUM: 4 meq/L (ref 3.7–5.3)
Sodium: 138 mEq/L (ref 137–147)
Total Protein: 6.8 g/dL (ref 6.0–8.3)

## 2014-01-28 LAB — URINALYSIS, ROUTINE W REFLEX MICROSCOPIC
Glucose, UA: NEGATIVE mg/dL
HGB URINE DIPSTICK: NEGATIVE
KETONES UR: NEGATIVE mg/dL
Nitrite: NEGATIVE
PROTEIN: NEGATIVE mg/dL
Specific Gravity, Urine: 1.029 (ref 1.005–1.030)
UROBILINOGEN UA: 1 mg/dL (ref 0.0–1.0)
pH: 6 (ref 5.0–8.0)

## 2014-01-28 LAB — URINE MICROSCOPIC-ADD ON

## 2014-01-28 LAB — CBC WITH DIFFERENTIAL/PLATELET
BASOS ABS: 0 10*3/uL (ref 0.0–0.1)
BASOS PCT: 1 % (ref 0–1)
Eosinophils Absolute: 0.3 10*3/uL (ref 0.0–0.7)
Eosinophils Relative: 6 % — ABNORMAL HIGH (ref 0–5)
HCT: 35.5 % — ABNORMAL LOW (ref 36.0–46.0)
Hemoglobin: 12 g/dL (ref 12.0–15.0)
Lymphocytes Relative: 30 % (ref 12–46)
Lymphs Abs: 1.7 10*3/uL (ref 0.7–4.0)
MCH: 29.8 pg (ref 26.0–34.0)
MCHC: 33.8 g/dL (ref 30.0–36.0)
MCV: 88.1 fL (ref 78.0–100.0)
Monocytes Absolute: 0.4 10*3/uL (ref 0.1–1.0)
Monocytes Relative: 6 % (ref 3–12)
NEUTROS ABS: 3.4 10*3/uL (ref 1.7–7.7)
NEUTROS PCT: 58 % (ref 43–77)
PLATELETS: 236 10*3/uL (ref 150–400)
RBC: 4.03 MIL/uL (ref 3.87–5.11)
RDW: 13.8 % (ref 11.5–15.5)
WBC: 5.8 10*3/uL (ref 4.0–10.5)

## 2014-01-28 LAB — WET PREP, GENITAL
TRICH WET PREP: NONE SEEN
WBC, Wet Prep HPF POC: NONE SEEN
Yeast Wet Prep HPF POC: NONE SEEN

## 2014-01-28 LAB — LIPASE, BLOOD: Lipase: 27 U/L (ref 11–59)

## 2014-01-28 LAB — POC URINE PREG, ED: PREG TEST UR: NEGATIVE

## 2014-01-28 MED ORDER — PROMETHAZINE HCL 25 MG PO TABS: 25.0000 mg | ORAL_TABLET | Freq: Four times a day (QID) | ORAL | Status: DC | PRN

## 2014-01-28 MED ORDER — HYDROMORPHONE HCL PF 1 MG/ML IJ SOLN
1.0000 mg | Freq: Once | INTRAMUSCULAR | Status: AC
  Administered 2014-01-28: 1 mg via INTRAVENOUS
  Filled 2014-01-28: qty 1

## 2014-01-28 MED ORDER — DOXYCYCLINE HYCLATE 100 MG PO CAPS: 100.0000 mg | ORAL_CAPSULE | Freq: Two times a day (BID) | ORAL | Status: DC

## 2014-01-28 MED ORDER — METRONIDAZOLE 500 MG PO TABS: 500.0000 mg | ORAL_TABLET | Freq: Two times a day (BID) | ORAL | Status: DC

## 2014-01-28 MED ORDER — ONDANSETRON HCL 4 MG/2ML IJ SOLN
4.0000 mg | Freq: Once | INTRAMUSCULAR | Status: AC
  Administered 2014-01-28: 4 mg via INTRAVENOUS
  Filled 2014-01-28: qty 2

## 2014-01-28 MED ORDER — SODIUM CHLORIDE 0.9 % IV BOLUS (SEPSIS)
1000.0000 mL | Freq: Once | INTRAVENOUS | Status: AC
  Administered 2014-01-28: 1000 mL via INTRAVENOUS

## 2014-01-28 MED ORDER — OXYCODONE-ACETAMINOPHEN 5-325 MG PO TABS: 2.0000 | ORAL_TABLET | Freq: Four times a day (QID) | ORAL | Status: DC | PRN

## 2014-01-28 NOTE — Discharge Instructions (Signed)
Pelvic Pain, Female °Female pelvic pain can be caused by many different things and start from a variety of places. Pelvic pain refers to pain that is located in the lower half of the abdomen and between your hips. The pain may occur over a short period of time (acute) or may be reoccurring (chronic). The cause of pelvic pain may be related to disorders affecting the female reproductive organs (gynecologic), but it may also be related to the bladder, kidney stones, an intestinal complication, or muscle or skeletal problems. Getting help right away for pelvic pain is important, especially if there has been severe, sharp, or a sudden onset of unusual pain. It is also important to get help right away because some types of pelvic pain can be life threatening.  °CAUSES  °Below are only some of the causes of pelvic pain. The causes of pelvic pain can be in one of several categories.  °· Gynecologic. °· Pelvic inflammatory disease. °· Sexually transmitted infection. °· Ovarian cyst or a twisted ovarian ligament (ovarian torsion). °· Uterine lining that grows outside the uterus (endometriosis). °· Fibroids, cysts, or tumors. °· Ovulation. °· Pregnancy. °· Pregnancy that occurs outside the uterus (ectopic pregnancy). °· Miscarriage. °· Labor. °· Abruption of the placenta or ruptured uterus. °· Infection. °· Uterine infection (endometritis). °· Bladder infection. °· Diverticulitis. °· Miscarriage related to a uterine infection (septic abortion). °· Bladder. °· Inflammation of the bladder (cystitis). °· Kidney stone(s). °· Gastrointenstinal. °· Constipation. °· Diverticulitis. °· Neurologic. °· Trauma. °· Feeling pelvic pain because of mental or emotional causes (psychosomatic). °· Cancers of the bowel or pelvis. °EVALUATION  °Your caregiver will want to take a careful history of your concerns. This includes recent changes in your health, a careful gynecologic history of your periods (menses), and a sexual history. Obtaining  your family history and medical history is also important. Your caregiver may suggest a pelvic exam. A pelvic exam will help identify the location and severity of the pain. It also helps in the evaluation of which organ system may be involved. In order to identify the cause of the pelvic pain and be properly treated, your caregiver may order tests. These tests may include:  °· A pregnancy test. °· Pelvic ultrasonography. °· An X-ray exam of the abdomen. °· A urinalysis or evaluation of vaginal discharge. °· Blood tests. °HOME CARE INSTRUCTIONS  °· Only take over-the-counter or prescription medicines for pain, discomfort, or fever as directed by your caregiver.   °· Rest as directed by your caregiver.   °· Eat a balanced diet.   °· Drink enough fluids to make your urine clear or pale yellow, or as directed.   °· Avoid sexual intercourse if it causes pain.   °· Apply warm or cold compresses to the lower abdomen depending on which one helps the pain.   °· Avoid stressful situations.   °· Keep a journal of your pelvic pain. Write down when it started, where the pain is located, and if there are things that seem to be associated with the pain, such as food or your menstrual cycle. °· Follow up with your caregiver as directed.   °SEEK MEDICAL CARE IF: °· Your medicine does not help your pain. °· You have abnormal vaginal discharge. °SEEK IMMEDIATE MEDICAL CARE IF:  °· You have heavy bleeding from the vagina.   °· Your pelvic pain increases.   °· You feel lightheaded or faint.   °· You have chills.   °· You have pain with urination or blood in your urine.   °· You have uncontrolled   diarrhea or vomiting.   You have a fever or persistent symptoms for more than 3 days.  You have a fever and your symptoms suddenly get worse.   You are being physically or sexually abused.  MAKE SURE YOU:  Understand these instructions.  Will watch your condition.  Will get help if you are not doing well or get worse. Document  Released: 09/07/2004 Document Revised: 04/11/2012 Document Reviewed: 01/31/2012 [facility] Patient Information 2014 [place], [place].  Bacterial Vaginosis Bacterial vaginosis is an infection of the vagina. It happens when too many of certain germs (bacteria) grow in the vagina. HOME CARE  Take your medicine as told by your doctor.  Finish your medicine even if you start to feel better.  Do not have sex until you finish your medicine and are better.  Tell your sex partner that you have an infection. They should see their doctor for treatment.  Practice safe sex. Use condoms. Have only one sex partner. GET HELP IF:  You are not getting better after 3 days of treatment.  You have more grey fluid (discharge) coming from your vagina than before.  You have more pain than before.  You have a fever. MAKE SURE YOU:   Understand these instructions.  Will watch your condition.  Will get help right away if you are not doing well or get worse. Document Released: 07/20/2008 Document Revised: 08/01/2013 Document Reviewed: 05/23/2013 [facility] Patient Information 2014 [place].

## 2014-01-28 NOTE — Progress Notes (Signed)
[facility] provided pt with a list of primary care resources to help patient establish primary care.  °

## 2014-01-28 NOTE — ED Provider Notes (Signed)
CSN: [id number]     Arrival date & time 01/28/14  1103 History   First MD Initiated Contact with Patient 01/28/14 1123     Chief Complaint  Patient presents with  . Dizziness  . Abdominal Pain  . Emesis     (Consider location/radiation/quality/duration/timing/severity/associated sxs/prior Treatment) HPI Comments: Patient is a 48 year old female with history of dysmenorrhea and hypertension who presents today with right-sided lower abdominal pain for the past 3 days. The pain is a cramping, sharp pain that is constant. This is the same pain she had when she was seen in the ED one month ago and diagnosed with an ovarian cyst. She has had nausea and vomiting over the past 2 days and has not been able to keep anything down. She is concerned because she takes blood pressure medications on a daily basis and has not been able to take them today. Additionally she complains of menstrual problems. She has been having her periods more frequently. She has been seen at [facility] where a pap smear was completed. She was told everything was normal when she was seen by women's. Patient denies dizziness to me despite nursing note.  The history is provided by the patient. No language interpreter was used.    Past Medical History  Diagnosis Date  . Hypertension   . Dysmenorrhea    Past Surgical History  Procedure Laterality Date  . Gallbladder surgery  2011  . Hand surgery  20 years ago    surgery due to infection per pt.   No family history on file. History  Substance Use Topics  . Smoking status: Never Smoker   . Smokeless tobacco: Not on file  . Alcohol Use: No   OB History   Grav Para Term Preterm Abortions TAB SAB Ect Mult Living                 Review of Systems  Constitutional: Negative for fever and chills.  Respiratory: Negative for shortness of breath.   Cardiovascular: Negative for chest pain.  Gastrointestinal: Positive for nausea, vomiting and abdominal pain.   Genitourinary: Positive for vaginal bleeding, menstrual problem and pelvic pain. Negative for dysuria.  Neurological: Negative for dizziness, light-headedness and headaches.  All other systems reviewed and are negative.      Allergies  Review of patient's allergies indicates no known allergies.  Home Medications   Current Outpatient Rx  Name  Route  Sig  Dispense  Refill  . ciprofloxacin (CIPRO) 250 MG tablet   Oral   Take 1 tablet (250 mg total) by mouth every 12 (twelve) hours.   10 tablet   0   . cloNIDine (CATAPRES) 0.2 MG tablet   Oral   Take 1 tablet (0.2 mg total) by mouth 2 (two) times daily.   60 tablet   1   . phenazopyridine (PYRIDIUM) 200 MG tablet   Oral   Take 1 tablet (200 mg total) by mouth 3 (three) times daily.   6 tablet   0   . promethazine (PHENERGAN) 25 MG tablet   Oral   Take 1 tablet (25 mg total) by mouth every 6 (six) hours as needed for nausea or vomiting.   30 tablet   0   . traMADol (ULTRAM) 50 MG tablet   Oral   Take 1 tablet (50 mg total) by mouth every 6 (six) hours as needed for severe pain.   30 tablet   0    BP 133/87  Pulse 72  Temp(Src) 98.5 F (36.9 C) (Oral)  Resp 16  SpO2 100%  LMP 12/28/2013 Physical Exam  Nursing note and vitals reviewed. Constitutional: She is oriented to person, place, and time. She appears well-developed and well-nourished. No distress.  HENT:  Head: Normocephalic and atraumatic.  Right Ear: External ear normal.  Left Ear: External ear normal.  Nose: Nose normal.  Mouth/Throat: Oropharynx is clear and moist.  Eyes: Conjunctivae are normal.  Neck: Normal range of motion.  Cardiovascular: Normal rate, regular rhythm and normal heart sounds.   Pulmonary/Chest: Effort normal and breath sounds normal. No stridor. No respiratory distress. She has no wheezes. She has no rales.  Abdominal: Soft. Bowel sounds are normal. She exhibits no distension. There is generalized tenderness.  Generalized  tenderness, worse in lower quadrants.   Genitourinary: There is no rash, tenderness, lesion or injury on the right labia. There is no rash, tenderness, lesion or injury on the left labia. Uterus is tender. Cervix exhibits discharge. Cervix exhibits no motion tenderness and no friability. Right adnexum displays tenderness. Right adnexum displays no mass and no fullness. Left adnexum displays tenderness. Left adnexum displays no mass and no fullness. There is tenderness around the vagina. No erythema or bleeding around the vagina. No foreign body around the vagina. No signs of injury around the vagina. Vaginal discharge found.  Patient with diffuse tenderness of pelvic exam. White discharge seen. No CMT.  Musculoskeletal: Normal range of motion.  Neurological: She is alert and oriented to person, place, and time. She has normal strength.  Skin: Skin is warm and dry. She is not diaphoretic. No erythema.  Psychiatric: She has a normal mood and affect. Her behavior is normal.    ED Course  Procedures (including critical care time) Labs Review Labs Reviewed  WET PREP, GENITAL - Abnormal; Notable for the following:    Clue Cells Wet Prep HPF POC FEW (*)    All other components within normal limits  CBC WITH DIFFERENTIAL - Abnormal; Notable for the following:    HCT 35.5 (*)    Eosinophils Relative 6 (*)    All other components within normal limits  COMPREHENSIVE METABOLIC PANEL - Abnormal; Notable for the following:    Glucose, Bld 103 (*)    Total Bilirubin <0.2 (*)    All other components within normal limits  URINALYSIS, ROUTINE W REFLEX MICROSCOPIC - Abnormal; Notable for the following:    Color, Urine AMBER (*)    APPearance CLOUDY (*)    Bilirubin Urine SMALL (*)    Leukocytes, UA TRACE (*)    All other components within normal limits  URINE MICROSCOPIC-ADD ON - Abnormal; Notable for the following:    Squamous Epithelial / LPF FEW (*)    Bacteria, UA MANY (*)    All other components  within normal limits  URINE CULTURE  GC/CHLAMYDIA PROBE AMP  LIPASE, BLOOD  POC URINE PREG, ED   Imaging Review No results found.   EKG Interpretation None      MDM   Final diagnoses:  Pelvic pain  Bacterial vaginosis   Patient presents to ED for evaluation of pelvic pain. This pain appears to be chronic in nature and patient had pelvic ultrasound one month ago which showed hemorrhagic cyst. Patient has had outpatient ultrasounds which have been unremarkable. Patient has been evaluated at [facility] hospital and told they cannot find any explanation for her pain. Her labs are unremarkable today. Wet prep shows few clue cells. Patient was tender on pelvic  exam and I will treat for possible PID with doxycycline. I will also give flagyl for BV. Patient was instructed to follow up with a gynecologist. I do not think patient has ovarian torsion or TOA at this time. She expresses understanding and appears reliable for follow up. Discussed case with Dr. Tomi Bamberger who agrees with plan. Return instructions given. Vital signs stable for discharge. Patient / Family / Caregiver informed of clinical course, understand medical decision-making process, and agree with plan.   Elwyn Lade, PA-C 01/28/14 1551

## 2014-01-28 NOTE — ED Notes (Signed)
Attempted IV in left hand, unsuccessful.

## 2014-01-28 NOTE — ED Notes (Signed)
Pt c/o right side abd pain x 3 days, vomiting x 2 days, and dizziness since this morning. Has vomited about 4 times today.

## 2014-01-29 LAB — URINE CULTURE: Colony Count: 6000

## 2014-01-29 LAB — GC/CHLAMYDIA PROBE AMP
CT Probe RNA: NEGATIVE
GC Probe RNA: NEGATIVE

## 2014-01-29 NOTE — ED Provider Notes (Signed)
Medical screening examination/treatment/procedure(s) were performed by non-physician practitioner and as supervising physician I was immediately available for consultation/collaboration.    Kathalene Frames, MD 01/29/14 [phone]

## 2014-01-29 NOTE — Progress Notes (Signed)
   CARE MANAGEMENT ED NOTE 01/29/2014  Patient:  Regina Estes, Regina Estes   Account Number:  [id number]  Date Initiated:  01/29/2014  Documentation initiated by:  Edwyna Shell  Subjective/Objective Assessment:   Received phone call from [facility] pharmacist, regarding clarification on a prescription the patient is presenting from a [facility] ED visit on 01/28/2014.     Subjective/Objective Assessment Detail:     Action/Plan:   Action/Plan Detail:   Looked up EPIC discharge note to read back the pt discharge medications. Sushmi was appreciaitve for the clarification and [facility] ED number was provided if she had any further questions.   Anticipated DC Date:       Status Recommendation to Physician:   Result of Recommendation:      [place]  Other    Choice offered to / List presented to:            Status of service:    ED Comments:   ED Comments Detail:

## 2014-01-30 ENCOUNTER — Telehealth (HOSPITAL_BASED_OUTPATIENT_CLINIC_OR_DEPARTMENT_OTHER): Payer: Self-pay

## 2014-01-30 NOTE — Telephone Encounter (Signed)
Pt presented to pharmacy with Rx written by Dr Thurnell Garbe for Hydrocodone.  According to [facility] record pt rcvd Rx for Hydrocodone 12/02/13 from Dr Thurnell Garbe.  Pharmacy informed script too old to fill.

## 2014-03-06 ENCOUNTER — Encounter (HOSPITAL_COMMUNITY): Payer: Self-pay | Admitting: Emergency Medicine

## 2014-03-06 ENCOUNTER — Emergency Department (HOSPITAL_COMMUNITY)
Admission: EM | Admit: 2014-03-06 | Discharge: 2014-03-06 | Disposition: A | Discharge status: Home / Self Care | Payer: PRIVATE HEALTH INSURANCE | Attending: Emergency Medicine | Admitting: Emergency Medicine

## 2014-03-06 DIAGNOSIS — N920 Excessive and frequent menstruation with regular cycle: Secondary | ICD-10-CM | POA: Insufficient documentation

## 2014-03-06 DIAGNOSIS — Y92410 Unspecified street and highway as the place of occurrence of the external cause: Secondary | ICD-10-CM | POA: Insufficient documentation

## 2014-03-06 DIAGNOSIS — I10 Essential (primary) hypertension: Secondary | ICD-10-CM | POA: Insufficient documentation

## 2014-03-06 DIAGNOSIS — Z3202 Encounter for pregnancy test, result negative: Secondary | ICD-10-CM | POA: Insufficient documentation

## 2014-03-06 DIAGNOSIS — M549 Dorsalgia, unspecified: Secondary | ICD-10-CM

## 2014-03-06 DIAGNOSIS — S199XXA Unspecified injury of neck, initial encounter: Secondary | ICD-10-CM

## 2014-03-06 DIAGNOSIS — Y9389 Activity, other specified: Secondary | ICD-10-CM | POA: Insufficient documentation

## 2014-03-06 DIAGNOSIS — IMO0002 Reserved for concepts with insufficient information to code with codable children: Secondary | ICD-10-CM | POA: Insufficient documentation

## 2014-03-06 DIAGNOSIS — S0993XA Unspecified injury of face, initial encounter: Secondary | ICD-10-CM | POA: Insufficient documentation

## 2014-03-06 DIAGNOSIS — Z79899 Other long term (current) drug therapy: Secondary | ICD-10-CM | POA: Insufficient documentation

## 2014-03-06 DIAGNOSIS — R112 Nausea with vomiting, unspecified: Secondary | ICD-10-CM | POA: Insufficient documentation

## 2014-03-06 LAB — URINE MICROSCOPIC-ADD ON

## 2014-03-06 LAB — CBC WITH DIFFERENTIAL/PLATELET
BASOS PCT: 1 % (ref 0–1)
Basophils Absolute: 0 10*3/uL (ref 0.0–0.1)
EOS ABS: 0.3 10*3/uL (ref 0.0–0.7)
Eosinophils Relative: 5 % (ref 0–5)
HCT: 40.1 % (ref 36.0–46.0)
HEMOGLOBIN: 13.7 g/dL (ref 12.0–15.0)
LYMPHS ABS: 1.6 10*3/uL (ref 0.7–4.0)
Lymphocytes Relative: 34 % (ref 12–46)
MCH: 30.1 pg (ref 26.0–34.0)
MCHC: 34.2 g/dL (ref 30.0–36.0)
MCV: 88.1 fL (ref 78.0–100.0)
MONOS PCT: 7 % (ref 3–12)
Monocytes Absolute: 0.3 10*3/uL (ref 0.1–1.0)
NEUTROS ABS: 2.5 10*3/uL (ref 1.7–7.7)
NEUTROS PCT: 53 % (ref 43–77)
Platelets: 292 10*3/uL (ref 150–400)
RBC: 4.55 MIL/uL (ref 3.87–5.11)
RDW: 13.9 % (ref 11.5–15.5)
WBC: 4.7 10*3/uL (ref 4.0–10.5)

## 2014-03-06 LAB — RPR

## 2014-03-06 LAB — HIV ANTIBODY (ROUTINE TESTING W REFLEX): HIV: NONREACTIVE

## 2014-03-06 LAB — BASIC METABOLIC PANEL
BUN: 9 mg/dL (ref 6–23)
CHLORIDE: 100 meq/L (ref 96–112)
CO2: 23 mEq/L (ref 19–32)
Calcium: 9.8 mg/dL (ref 8.4–10.5)
Creatinine, Ser: 0.74 mg/dL (ref 0.50–1.10)
GLUCOSE: 107 mg/dL — AB (ref 70–99)
Potassium: 4.9 mEq/L (ref 3.7–5.3)
Sodium: 137 mEq/L (ref 137–147)

## 2014-03-06 LAB — WET PREP, GENITAL
TRICH WET PREP: NONE SEEN
Yeast Wet Prep HPF POC: NONE SEEN

## 2014-03-06 LAB — URINALYSIS, ROUTINE W REFLEX MICROSCOPIC
Glucose, UA: NEGATIVE mg/dL
Ketones, ur: 15 mg/dL — AB
Nitrite: NEGATIVE
PROTEIN: 100 mg/dL — AB
Specific Gravity, Urine: 1.017 (ref 1.005–1.030)
UROBILINOGEN UA: 1 mg/dL (ref 0.0–1.0)
pH: 6 (ref 5.0–8.0)

## 2014-03-06 LAB — POC URINE PREG, ED: Preg Test, Ur: NEGATIVE

## 2014-03-06 MED ORDER — MORPHINE SULFATE 4 MG/ML IJ SOLN
4.0000 mg | Freq: Once | INTRAMUSCULAR | Status: AC
  Administered 2014-03-06: 4 mg via INTRAVENOUS
  Filled 2014-03-06: qty 1

## 2014-03-06 MED ORDER — KETOROLAC TROMETHAMINE 30 MG/ML IJ SOLN
30.0000 mg | Freq: Once | INTRAMUSCULAR | Status: AC
  Administered 2014-03-06: 30 mg via INTRAVENOUS
  Filled 2014-03-06: qty 1

## 2014-03-06 MED ORDER — ONDANSETRON HCL 4 MG/2ML IJ SOLN
4.0000 mg | Freq: Once | INTRAMUSCULAR | Status: AC
  Administered 2014-03-06: 4 mg via INTRAVENOUS
  Filled 2014-03-06: qty 2

## 2014-03-06 MED ORDER — PROMETHAZINE HCL 25 MG PO TABS: 25.0000 mg | ORAL_TABLET | Freq: Four times a day (QID) | ORAL | Status: DC | PRN

## 2014-03-06 MED ORDER — IBUPROFEN 600 MG PO TABS: 600.0000 mg | ORAL_TABLET | Freq: Four times a day (QID) | ORAL | Status: DC | PRN

## 2014-03-06 MED ORDER — OXYCODONE-ACETAMINOPHEN 5-325 MG PO TABS: 1.0000 | ORAL_TABLET | ORAL | Status: DC | PRN

## 2014-03-06 MED ORDER — OXYCODONE-ACETAMINOPHEN 5-325 MG PO TABS
1.0000 | ORAL_TABLET | Freq: Once | ORAL | Status: AC
  Administered 2014-03-06: 1 via ORAL
  Filled 2014-03-06: qty 1

## 2014-03-06 MED ORDER — NORETHINDRONE 0.35 MG PO TABS: 1.0000 | ORAL_TABLET | Freq: Every day | ORAL | Status: DC
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
## 2014-03-06 NOTE — Discharge Instructions (Signed)
Lumbosacral Strain Lumbosacral strain is a strain of any of the parts that make up your lumbosacral vertebrae. Your lumbosacral vertebrae are the bones that make up the lower third of your backbone. Your lumbosacral vertebrae are held together by muscles and tough, fibrous tissue (ligaments).  CAUSES  A sudden blow to your back can cause lumbosacral strain. Also, anything that causes an excessive stretch of the muscles in the low back can cause this strain. This is typically seen when people exert themselves strenuously, fall, lift heavy objects, bend, or crouch repeatedly. RISK FACTORS  Physically demanding work.  Participation in pushing or pulling sports or sports that require sudden twist of the back (tennis, golf, baseball).  Weight lifting.  Excessive lower back curvature.  Forward-tilted pelvis.  Weak back or abdominal muscles or both.  Tight hamstrings. SIGNS AND SYMPTOMS  Lumbosacral strain may cause pain in the area of your injury or pain that moves (radiates) down your leg.  DIAGNOSIS Your health care provider can often diagnose lumbosacral strain through a physical exam. In some cases, you may need tests such as X-ray exams.  TREATMENT  Treatment for your lower back injury depends on many factors that your clinician will have to evaluate. However, most treatment will include the use of anti-inflammatory medicines. HOME CARE INSTRUCTIONS   Avoid hard physical activities (tennis, racquetball, waterskiing) if you are not in proper physical condition for it. This may aggravate or create problems.  If you have a back problem, avoid sports requiring sudden body movements. Swimming and walking are generally safer activities.  Maintain good posture.  Maintain a healthy weight.  For acute conditions, you may put ice on the injured area.  Put ice in a plastic bag.  Place a towel between your skin and the bag.  Leave the ice on for 20 minutes, 2 3 times a day.  When the  low back starts healing, stretching and strengthening exercises may be recommended. SEEK MEDICAL CARE IF:  Your back pain is getting worse.  You experience severe back pain not relieved with medicines. SEEK IMMEDIATE MEDICAL CARE IF:   You have numbness, tingling, weakness, or problems with the use of your arms or legs.  There is a change in bowel or bladder control.  You have increasing pain in any area of the body, including your belly (abdomen).  You notice shortness of breath, dizziness, or feel faint.  You feel sick to your stomach (nauseous), are throwing up (vomiting), or become sweaty.  You notice discoloration of your toes or legs, or your feet get very cold. MAKE SURE YOU:   Understand these instructions.  Will watch your condition.  Will get help right away if you are not doing well or get worse. Document Released: 07/21/2005 Document Revised: 08/01/2013 Document Reviewed: 05/30/2013 [facility] Patient Information 2014 [place], [place]. Menorrhagia Menorrhagia is the medical term for when your menstrual periods are heavy or last longer than usual. With menorrhagia, every period you have may cause enough blood loss and cramping that you are unable to maintain your usual activities. CAUSES  In some cases, the cause of heavy periods is unknown, but a number of conditions may cause menorrhagia. Common causes include:  A problem with the hormone-producing thyroid gland (hypothyroid).  Noncancerous growths in the uterus (polyps or fibroids).  An imbalance of the estrogen and progesterone hormones.  One of your ovaries not releasing an egg during one or more months.  Side effects of having an intrauterine device (IUD).  Side  effects of some medicines, such as anti-inflammatory medicines or blood thinners.  A bleeding disorder that stops your blood from clotting normally. SIGNS AND SYMPTOMS  During a normal period, bleeding lasts between 4 and 8 days. Signs that your  periods are too heavy include:  You routinely have to change your pad or tampon every 1 or 2 hours because it is completely soaked.  You pass blood clots larger than 1 inch (2.5 cm) in size.  You have bleeding for more than 7 days.  You need to use pads and tampons at the same time because of heavy bleeding.  You need to wake up to change your pads or tampons during the night.  You have symptoms of anemia, such as tiredness, fatigue, or shortness of breath. DIAGNOSIS  Your health care provider will perform a physical exam and ask you questions about your symptoms and menstrual history. Other tests may be ordered based on what the health care provider finds during the exam. These tests can include:  Blood tests To check if you are pregnant or have hormonal changes, a bleeding or thyroid disorder, low iron levels (anemia), or other problems.  Endometrial biopsy Your health care provider takes a sample of tissue from the inside of your uterus to be examined under a microscope.  Pelvic ultrasound This test uses sound waves to make a picture of your uterus, ovaries, and vagina. The pictures can show if you have fibroids or other growths.  Hysteroscopy For this test, your health care provider will use a small telescope to look inside your uterus. Based on the results of your initial tests, your health care provider may recommend further testing. TREATMENT  Treatment may not be needed. If it is needed, your health care provider may recommend treatment with one or more medicines first. If these do not reduce bleeding enough, a surgical treatment might be an option. The best treatment for you will depend on:   Whether you need to prevent pregnancy.  Your desire to have children in the future.  The cause and severity of your bleeding.  Your opinion and personal preference.  Medicines for menorrhagia may include:  Birth control methods that use hormones These include the pill, skin  patch, vaginal ring, shots that you get every 3 months, hormonal IUD, and implant. These treatments reduce bleeding during your menstrual period.  Medicines that thicken blood and slow bleeding.  Medicines that reduce swelling, such as ibuprofen.  Medicines that contain a synthetic hormone called progestin.   Medicines that make the ovaries stop working for a short time.  You may need surgical treatment for menorrhagia if the medicines are unsuccessful. Treatment options include:  Dilation and curettage (D&C) In this procedure, your health care provider opens (dilates) your cervix and then scrapes or suctions tissue from the lining of your uterus to reduce menstrual bleeding.  Operative hysteroscopy This procedure uses a tiny tube with a light (hysteroscope) to view your uterine cavity and can help in the surgical removal of a polyp that may be causing heavy periods.  Endometrial ablation Through various techniques, your health care provider permanently destroys the entire lining of your uterus (endometrium). After endometrial ablation, most women have little or no menstrual flow. Endometrial ablation reduces your ability to become pregnant.  Endometrial resection This surgical procedure uses an electrosurgical wire loop to remove the lining of the uterus. This procedure also reduces your ability to become pregnant.  Hysterectomy Surgical removal of the uterus and cervix is  a permanent procedure that stops menstrual periods. Pregnancy is not possible after a hysterectomy. This procedure requires anesthesia and hospitalization. HOME CARE INSTRUCTIONS   Only take over-the-counter or prescription medicines as directed by your health care provider. Take prescribed medicines exactly as directed. Do not change or switch medicines without consulting your health care provider.  Take any prescribed iron pills exactly as directed by your health care provider. Long-term heavy bleeding may result in  low iron levels. Iron pills help replace the iron your body lost from heavy bleeding. Iron may cause constipation. If this becomes a problem, increase the bran, fruits, and roughage in your diet.  Do not take aspirin or medicines that contain aspirin 1 week before or during your menstrual period. Aspirin may make the bleeding worse.  If you need to change your sanitary pad or tampon more than once every 2 hours, stay in bed and rest as much as possible until the bleeding stops.  Eat well-balanced meals. Eat foods high in iron. Examples are leafy green vegetables, meat, liver, eggs, and whole grain breads and cereals. Do not try to lose weight until the abnormal bleeding has stopped and your blood iron level is back to normal. SEEK MEDICAL CARE IF:   You soak through a pad or tampon every 1 or 2 hours, and this happens every time you have a period.  You need to use pads and tampons at the same time because you are bleeding so much.  You need to change your pad or tampon during the night.  You have a period that lasts for more than 8 days.  You pass clots bigger than 1 inch wide.  You have irregular periods that happen more or less often than once a month.  You feel dizzy or faint.  You feel very weak or tired.  You feel short of breath or feel your heart is beating too fast when you exercise.  You have nausea and vomiting or diarrhea while you are taking your medicine.  You have any problems that may be related to the medicine you are taking. SEEK IMMEDIATE MEDICAL CARE IF:   You soak through 4 or more pads or tampons in 2 hours.  You have any bleeding while you are pregnant. MAKE SURE YOU:   Understand these instructions.  Will watch your condition.  Will get help right away if you are not doing well or get worse. Document Released: 10/11/2005 Document Revised: 08/01/2013 Document Reviewed: 04/01/2013 [facility] Patient Information 2014 [place].

## 2014-03-07 LAB — GC/CHLAMYDIA PROBE AMP
CT Probe RNA: NEGATIVE
GC Probe RNA: NEGATIVE

## 2014-03-09 LAB — URINE CULTURE

## 2014-03-10 ENCOUNTER — Telehealth (HOSPITAL_BASED_OUTPATIENT_CLINIC_OR_DEPARTMENT_OTHER): Payer: Self-pay | Admitting: Emergency Medicine

## 2014-03-10 NOTE — Telephone Encounter (Signed)
Post ED Visit - Positive Culture Follow-up  Culture report reviewed by antimicrobial stewardship pharmacist: []  Wes Dulaney, Pharm.D., BCPS []  Heide Guile, Pharm.D., BCPS []  Alycia Rossetti, Pharm.D., BCPS []  Hector, Pharm.D., BCPS, AAHIVP []  Legrand Como, Pharm.D., BCPS, AAHIVP []  Juliene Pina, Pharm.D. [x]  Salome Arnt, Pharm.D.  Positive urine culture Per Hyman Bible PA-C, no treatment needed and no further patient follow-up is required at this time.  Dequavius Kuhner 03/10/2014, 3:06 PM

## 2014-03-16 ENCOUNTER — Encounter (HOSPITAL_COMMUNITY): Payer: Self-pay | Admitting: Emergency Medicine

## 2014-03-16 ENCOUNTER — Emergency Department (HOSPITAL_COMMUNITY)
Admission: EM | Admit: 2014-03-16 | Discharge: 2014-03-16 | Disposition: A | Discharge status: Home / Self Care | Payer: PRIVATE HEALTH INSURANCE | Attending: Emergency Medicine | Admitting: Emergency Medicine

## 2014-03-16 DIAGNOSIS — S46909A Unspecified injury of unspecified muscle, fascia and tendon at shoulder and upper arm level, unspecified arm, initial encounter: Secondary | ICD-10-CM | POA: Insufficient documentation

## 2014-03-16 DIAGNOSIS — Z79899 Other long term (current) drug therapy: Secondary | ICD-10-CM | POA: Insufficient documentation

## 2014-03-16 DIAGNOSIS — Y9389 Activity, other specified: Secondary | ICD-10-CM | POA: Insufficient documentation

## 2014-03-16 DIAGNOSIS — I10 Essential (primary) hypertension: Secondary | ICD-10-CM

## 2014-03-16 DIAGNOSIS — Y92410 Unspecified street and highway as the place of occurrence of the external cause: Secondary | ICD-10-CM | POA: Insufficient documentation

## 2014-03-16 DIAGNOSIS — M25519 Pain in unspecified shoulder: Secondary | ICD-10-CM

## 2014-03-16 DIAGNOSIS — Z8742 Personal history of other diseases of the female genital tract: Secondary | ICD-10-CM | POA: Insufficient documentation

## 2014-03-16 DIAGNOSIS — S4980XA Other specified injuries of shoulder and upper arm, unspecified arm, initial encounter: Secondary | ICD-10-CM | POA: Insufficient documentation

## 2014-03-16 MED ORDER — CLONIDINE HCL 0.2 MG PO TABS: 0.2000 mg | ORAL_TABLET | Freq: Three times a day (TID) | ORAL | Status: DC

## 2014-03-16 MED ORDER — HYDROCODONE-ACETAMINOPHEN 5-325 MG PO TABS: 1.0000 | ORAL_TABLET | Freq: Four times a day (QID) | ORAL | Status: DC | PRN

## 2014-03-16 NOTE — Discharge Instructions (Signed)
Musculoskeletal Pain °Musculoskeletal pain is muscle and boney aches and pains. These pains can occur in any part of the body. Your caregiver may treat you without knowing the cause of the pain. They may treat you if blood or urine tests, X-rays, and other tests were normal.  °CAUSES °There is often not a definite cause or reason for these pains. These pains may be caused by a type of germ (virus). The discomfort may also come from overuse. Overuse includes working out too hard when your body is not fit. Boney aches also come from weather changes. Bone is sensitive to atmospheric pressure changes. °HOME CARE INSTRUCTIONS  °· Ask when your test results will be ready. Make sure you get your test results. °· Only take over-the-counter or prescription medicines for pain, discomfort, or fever as directed by your caregiver. If you were given medications for your condition, do not drive, operate machinery or power tools, or sign legal documents for 24 hours. Do not drink alcohol. Do not take sleeping pills or other medications that may interfere with treatment. °· Continue all activities unless the activities cause more pain. When the pain lessens, slowly resume normal activities. Gradually increase the intensity and duration of the activities or exercise. °· During periods of severe pain, bed rest may be helpful. Lay or sit in any position that is comfortable. °· Putting ice on the injured area. °· Put ice in a bag. °· Place a towel between your skin and the bag. °· Leave the ice on for 15 to 20 minutes, 3 to 4 times a day. °· Follow up with your caregiver for continued problems and no reason can be found for the pain. If the pain becomes worse or does not go away, it may be necessary to repeat tests or do additional testing. Your caregiver may need to look further for a possible cause. °SEEK IMMEDIATE MEDICAL CARE IF: °· You have pain that is getting worse and is not relieved by medications. °· You develop chest pain  that is associated with shortness or breath, sweating, feeling sick to your stomach (nauseous), or throw up (vomit). °· Your pain becomes localized to the abdomen. °· You develop any new symptoms that seem different or that concern you. °MAKE SURE YOU:  °· Understand these instructions. °· Will watch your condition. °· Will get help right away if you are not doing well or get worse. °Document Released: 10/11/2005 Document Revised: 01/03/2012 Document Reviewed: 06/15/2013 °[employer]® Patient Information ©2014 [employer]. ° °
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
## 2014-03-16 NOTE — ED Provider Notes (Signed)
Medical screening examination/treatment/procedure(s) were performed by non-physician practitioner and as supervising physician I was immediately available for consultation/collaboration.   EKG Interpretation None       Threasa Beards, MD 03/16/14 1256

## 2014-03-21 ENCOUNTER — Emergency Department (HOSPITAL_COMMUNITY)
Admission: EM | Admit: 2014-03-21 | Discharge: 2014-03-21 | Disposition: A | Discharge status: Home / Self Care | Payer: PRIVATE HEALTH INSURANCE | Attending: Emergency Medicine | Admitting: Emergency Medicine

## 2014-03-21 ENCOUNTER — Emergency Department (HOSPITAL_COMMUNITY): Payer: PRIVATE HEALTH INSURANCE

## 2014-03-21 ENCOUNTER — Encounter (HOSPITAL_COMMUNITY): Payer: Self-pay | Admitting: Emergency Medicine

## 2014-03-21 DIAGNOSIS — Z79899 Other long term (current) drug therapy: Secondary | ICD-10-CM | POA: Insufficient documentation

## 2014-03-21 DIAGNOSIS — M25519 Pain in unspecified shoulder: Secondary | ICD-10-CM | POA: Insufficient documentation

## 2014-03-21 DIAGNOSIS — G8911 Acute pain due to trauma: Secondary | ICD-10-CM | POA: Insufficient documentation

## 2014-03-21 DIAGNOSIS — Z8742 Personal history of other diseases of the female genital tract: Secondary | ICD-10-CM | POA: Insufficient documentation

## 2014-03-21 DIAGNOSIS — I10 Essential (primary) hypertension: Secondary | ICD-10-CM | POA: Insufficient documentation

## 2014-03-21 MED ORDER — ONDANSETRON 4 MG PO TBDP: 4.0000 mg | ORAL_TABLET | Freq: Three times a day (TID) | ORAL | Status: DC | PRN

## 2014-03-21 MED ORDER — CLONIDINE HCL 0.2 MG PO TABS: 0.2000 mg | ORAL_TABLET | Freq: Three times a day (TID) | ORAL | Status: DC

## 2014-03-21 MED ORDER — MELOXICAM 7.5 MG PO TABS: 7.5000 mg | ORAL_TABLET | Freq: Every day | ORAL | Status: DC

## 2014-03-21 MED ORDER — TRAMADOL HCL 50 MG PO TABS: 50.0000 mg | ORAL_TABLET | Freq: Four times a day (QID) | ORAL | Status: DC | PRN

## 2014-03-21 NOTE — Discharge Instructions (Signed)
Please read and follow all provided instructions.  Your diagnoses today include:  1. Shoulder pain     Tests performed today include:  An x-ray of the affected area - does NOT show any broken bones  Vital signs. See below for your results today.   Medications prescribed:   Tramadol - narcotic-like pain medication  DO NOT drive or perform any activities that require you to be awake and alert because this medicine can make you drowsy.    Meloxicam - anti-inflammatory pain medication  You have been prescribed an anti-inflammatory medication or NSAID. Take with food. Do not take aspirin, ibuprofen, or naproxen if taking this medication. Take smallest effective dose for the shortest duration needed for your pain. Stop taking if you experience stomach pain or vomiting.   Take any prescribed medications only as directed.  Home care instructions:   Follow any educational materials contained in this packet  Follow R.I.C.E. Protocol:  R - rest your injury   I  - use ice on injury without applying directly to skin  C - compress injury with bandage or splint  E - elevate the injury as much as possible  Follow-up instructions: Please follow-up with your primary care provider or your orthopedic physician if you continue to have significant pain or trouble walking in 1 week. In this case you may have a severe injury that requires further care.   If you do not have a primary care doctor -- see below for referral information.   Return instructions:   Please return if your toes are numb or tingling, appear gray or blue, or you have severe pain (also elevate leg and loosen splint or wrap if you were given one)  Please return to the Emergency Department if you experience worsening symptoms.   Please return if you have any other emergent concerns.  Additional Information:  Your vital signs today were: BP 100/60   Pulse 92   Temp(Src) 97.9 F (36.6 C) (Oral)   Resp 18   SpO2 100%    LMP 03/08/2014 If your blood pressure (BP) was elevated above 135/85 this visit, please have this repeated by your doctor within one month. --------------
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
## 2014-03-24 NOTE — ED Provider Notes (Signed)
Medical screening examination/treatment/procedure(s) were performed by non-physician practitioner and as supervising physician I was immediately available for consultation/collaboration.   EKG Interpretation None        Houston Siren III, MD 03/24/14 2038

## 2014-04-05 ENCOUNTER — Encounter (HOSPITAL_COMMUNITY): Payer: Self-pay | Admitting: Emergency Medicine

## 2014-04-05 ENCOUNTER — Emergency Department (HOSPITAL_COMMUNITY)
Admission: EM | Admit: 2014-04-05 | Discharge: 2014-04-05 | Disposition: A | Discharge status: Home / Self Care | Payer: PRIVATE HEALTH INSURANCE | Attending: Emergency Medicine | Admitting: Emergency Medicine

## 2014-04-05 DIAGNOSIS — Z79899 Other long term (current) drug therapy: Secondary | ICD-10-CM | POA: Insufficient documentation

## 2014-04-05 DIAGNOSIS — I10 Essential (primary) hypertension: Secondary | ICD-10-CM | POA: Insufficient documentation

## 2014-04-05 DIAGNOSIS — M25519 Pain in unspecified shoulder: Secondary | ICD-10-CM

## 2014-04-05 MED ORDER — TRAMADOL HCL 50 MG PO TABS: 50.0000 mg | ORAL_TABLET | Freq: Four times a day (QID) | ORAL | Status: DC | PRN
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
## 2014-04-05 NOTE — Discharge Instructions (Signed)
Arthralgia  Your caregiver has diagnosed you as suffering from an arthralgia. Arthralgia means there is pain in a joint. This can come from many reasons including:  · Bruising the joint which causes soreness (inflammation) in the joint.  · Wear and tear on the joints which occur as we grow older (osteoarthritis).  · Overusing the joint.  · Various forms of arthritis.  · Infections of the joint.  Regardless of the cause of pain in your joint, most of these different pains respond to anti-inflammatory drugs and rest. The exception to this is when a joint is infected, and these cases are treated with antibiotics, if it is a bacterial infection.  HOME CARE INSTRUCTIONS   · Rest the injured area for as long as directed by your caregiver. Then slowly start using the joint as directed by your caregiver and as the pain allows. Crutches as directed may be useful if the ankles, knees or hips are involved. If the knee was splinted or casted, continue use and care as directed. If an stretchy or elastic wrapping bandage has been applied today, it should be removed and re-applied every 3 to 4 hours. It should not be applied tightly, but firmly enough to keep swelling down. Watch toes and feet for swelling, bluish discoloration, coldness, numbness or excessive pain. If any of these problems (symptoms) occur, remove the ace bandage and re-apply more loosely. If these symptoms persist, contact your caregiver or return to this location.  · For the first 24 hours, keep the injured extremity elevated on pillows while lying down.  · Apply ice for 15-20 minutes to the sore joint every couple hours while awake for the first half day. Then 03-04 times per day for the first 48 hours. Put the ice in a plastic bag and place a towel between the bag of ice and your skin.  · Wear any splinting, casting, elastic bandage applications, or slings as instructed.  · Only take over-the-counter or prescription medicines for pain, discomfort, or fever as  directed by your caregiver. Do not use aspirin immediately after the injury unless instructed by your physician. Aspirin can cause increased bleeding and bruising of the tissues.  · If you were given crutches, continue to use them as instructed and do not resume weight bearing on the sore joint until instructed.  Persistent pain and inability to use the sore joint as directed for more than 2 to 3 days are warning signs indicating that you should see a caregiver for a follow-up visit as soon as possible. Initially, a hairline fracture (break in bone) may not be evident on X-rays. Persistent pain and swelling indicate that further evaluation, non-weight bearing or use of the joint (use of crutches or slings as instructed), or further X-rays are indicated. X-rays may sometimes not show a small fracture until a week or 10 days later. Make a follow-up appointment with your own caregiver or one to whom we have referred you. A radiologist (specialist in reading X-rays) may read your X-rays. Make sure you know how you are to obtain your X-ray results. Do not assume everything is normal if you do not hear from us.  SEEK MEDICAL CARE IF:  Bruising, swelling, or pain increases.  SEEK IMMEDIATE MEDICAL CARE IF:   · Your fingers or toes are numb or blue.  · The pain is not responding to medications and continues to stay the same or get worse.  · The pain in your joint becomes severe.  · You   develop a fever over 102° F (38.9° C).  · It becomes impossible to move or use the joint.  MAKE SURE YOU:   · Understand these instructions.  · Will watch your condition.  · Will get help right away if you are not doing well or get worse.  Document Released: 10/11/2005 Document Revised: 01/03/2012 Document Reviewed: 05/29/2008  [employer]® Patient Information ©2014 [employer].
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
## 2014-04-09 ENCOUNTER — Emergency Department (HOSPITAL_COMMUNITY)
Admission: EM | Admit: 2014-04-09 | Discharge: 2014-04-10 | Disposition: A | Discharge status: Home / Self Care | Payer: PRIVATE HEALTH INSURANCE | Attending: Emergency Medicine | Admitting: Emergency Medicine

## 2014-04-09 ENCOUNTER — Encounter (HOSPITAL_COMMUNITY): Payer: Self-pay | Admitting: Emergency Medicine

## 2014-04-09 DIAGNOSIS — I10 Essential (primary) hypertension: Secondary | ICD-10-CM | POA: Insufficient documentation

## 2014-04-09 DIAGNOSIS — K5900 Constipation, unspecified: Secondary | ICD-10-CM | POA: Insufficient documentation

## 2014-04-09 DIAGNOSIS — R1084 Generalized abdominal pain: Secondary | ICD-10-CM | POA: Insufficient documentation

## 2014-04-09 DIAGNOSIS — R197 Diarrhea, unspecified: Secondary | ICD-10-CM | POA: Insufficient documentation

## 2014-04-09 DIAGNOSIS — R112 Nausea with vomiting, unspecified: Secondary | ICD-10-CM | POA: Insufficient documentation

## 2014-04-09 DIAGNOSIS — R109 Unspecified abdominal pain: Secondary | ICD-10-CM

## 2014-04-09 DIAGNOSIS — Z79899 Other long term (current) drug therapy: Secondary | ICD-10-CM | POA: Insufficient documentation

## 2014-04-09 LAB — COMPREHENSIVE METABOLIC PANEL
ALBUMIN: 3.7 g/dL (ref 3.5–5.2)
ALT: 8 U/L (ref 0–35)
AST: 15 U/L (ref 0–37)
Alkaline Phosphatase: 81 U/L (ref 39–117)
BUN: 4 mg/dL — ABNORMAL LOW (ref 6–23)
CALCIUM: 9.7 mg/dL (ref 8.4–10.5)
CO2: 19 mEq/L (ref 19–32)
CREATININE: 0.73 mg/dL (ref 0.50–1.10)
Chloride: 104 mEq/L (ref 96–112)
GFR calc Af Amer: >90 mL/min (ref >90)
GFR calc non Af Amer: >90 mL/min (ref >90)
Glucose, Bld: 110 mg/dL — ABNORMAL HIGH (ref 70–99)
Potassium: 3.9 mEq/L (ref 3.7–5.3)
Sodium: 137 mEq/L (ref 137–147)
Total Bilirubin: <0.2 mg/dL — ABNORMAL LOW (ref 0.3–1.2)
Total Protein: 7.3 g/dL (ref 6.0–8.3)

## 2014-04-09 LAB — URINALYSIS, ROUTINE W REFLEX MICROSCOPIC
GLUCOSE, UA: NEGATIVE mg/dL
Ketones, ur: NEGATIVE mg/dL
Nitrite: NEGATIVE
Protein, ur: 30 mg/dL — AB
Specific Gravity, Urine: 1.038 — ABNORMAL HIGH (ref 1.005–1.030)
UROBILINOGEN UA: 1 mg/dL (ref 0.0–1.0)
pH: 6 (ref 5.0–8.0)

## 2014-04-09 LAB — URINE MICROSCOPIC-ADD ON

## 2014-04-09 MED ORDER — FENTANYL CITRATE 0.05 MG/ML IJ SOLN
100.0000 ug | Freq: Once | INTRAMUSCULAR | Status: AC
  Administered 2014-04-10: 100 ug via INTRAVENOUS
  Filled 2014-04-09: qty 2

## 2014-04-09 MED ORDER — SODIUM CHLORIDE 0.9 % IV BOLUS (SEPSIS)
1000.0000 mL | Freq: Once | INTRAVENOUS | Status: AC
  Administered 2014-04-09: 1000 mL via INTRAVENOUS

## 2014-04-09 MED ORDER — PROMETHAZINE HCL 25 MG/ML IJ SOLN
12.5000 mg | Freq: Once | INTRAMUSCULAR | Status: AC
  Administered 2014-04-09: 12.5 mg via INTRAVENOUS
  Filled 2014-04-09: qty 1

## 2014-04-09 MED ORDER — KETOROLAC TROMETHAMINE 30 MG/ML IJ SOLN
30.0000 mg | Freq: Once | INTRAMUSCULAR | Status: AC
  Administered 2014-04-09: 30 mg via INTRAVENOUS
  Filled 2014-04-09: qty 1

## 2014-04-09 NOTE — ED Notes (Signed)
Pt c/o low abd pain x 3 days, +n/v/d

## 2014-04-09 NOTE — ED Provider Notes (Signed)
CSN: [id number]     Arrival date & time 04/09/14  2205 History   First MD Initiated Contact with Patient 04/09/14 2258     Chief Complaint  Patient presents with  . Abdominal Pain   HPI  History provided by the patient. Patient is a 48 year old female with history of hypertension and dysmenorrhea who presents with complaints of diffuse abdominal pain with nausea vomiting and diarrhea symptoms. Patient reports that she has had significant difficulties with dysmenorrhea for the past 6 months. She has significant pains and cramps usually accompanied with nausea and vomiting. This month she reports a much milder menstrual cycle which ended 2 days ago. She did however start having nausea and vomiting 3 days ago with increased frequency of bowel movements. She denies any watery diarrhea. No blood or mucus in the stool. Today she was having worsening lower abdomen pains and cramps that seem slightly different than her pains from menstruation. She denies any associated fever, chills or sweats. She did attempt to use the Zofran and tramadol which she has from previous prescriptions without any improvement. No other aggravating or alleviating factors. No other associated symptoms.    Past Medical History  Diagnosis Date  . Hypertension   . Dysmenorrhea    Past Surgical History  Procedure Laterality Date  . Gallbladder surgery  2011  . Hand surgery  20 years ago    surgery due to infection per pt.   No family history on file. History  Substance Use Topics  . Smoking status: Never Smoker   . Smokeless tobacco: Not on file  . Alcohol Use: No   OB History   Grav Para Term Preterm Abortions TAB SAB Ect Mult Living                 Review of Systems  Constitutional: Negative for fever, chills and diaphoresis.  Gastrointestinal: Positive for nausea, vomiting and abdominal pain. Negative for constipation and blood in stool.  Genitourinary: Negative for dysuria, frequency, hematuria, flank pain,  vaginal bleeding and vaginal discharge.  All other systems reviewed and are negative.     Allergies  Ciprofloxacin  Home Medications   Prior to Admission medications   Medication Sig Start Date End Date Taking? Authorizing Provider  cloNIDine (CATAPRES) 0.2 MG tablet Take 1 tablet (0.2 mg total) by mouth 3 (three) times daily. 03/21/14  Yes Carlisle Cater, PA-C  ibuprofen (ADVIL,MOTRIN) 600 MG tablet Take 1 tablet (600 mg total) by mouth every 6 (six) hours as needed. 03/06/14  Yes Merryl Hacker, MD  traMADol (ULTRAM) 50 MG tablet Take 50 mg by mouth every 6 (six) hours as needed (pain). 04/05/14  Yes Glendell Docker, NP   BP 109/78  Pulse 97  Temp(Src) 98.1 F (36.7 C) (Oral)  Resp 20  Ht 5\' 2"  (1.575 m)  Wt 125 lb (56.7 kg)  BMI 22.86 kg/m2  SpO2 98%  LMP 04/05/2014 Physical Exam  Nursing note and vitals reviewed. Constitutional: She is oriented to person, place, and time. She appears well-developed and well-nourished. No distress.  HENT:  Head: Normocephalic.  Cardiovascular: Normal rate and regular rhythm.   No murmur heard. Pulmonary/Chest: Effort normal and breath sounds normal. No respiratory distress.  Abdominal: Soft. Bowel sounds are normal. She exhibits no distension. There is tenderness. There is guarding. There is no rebound and no CVA tenderness.  Patient has diffuse abdominal tenderness to even light palpation. There does not appear to be any focal pains.  Musculoskeletal: Normal range of  motion.  Neurological: She is alert and oriented to person, place, and time.  Skin: Skin is warm and dry. No rash noted.  Psychiatric: She has a normal mood and affect. Her behavior is normal.    ED Course  Procedures   COORDINATION OF CARE:  Nursing notes reviewed. Vital signs reviewed. Initial pt interview and examination performed.   Filed Vitals:   04/09/14 2214  BP: 109/78  Pulse: 97  Temp: 98.1 F (36.7 C)  TempSrc: Oral  Resp: 20  Height: 5\' 2"  (1.575  m)  Weight: 125 lb (56.7 kg)  SpO2: 98%    10:58 PM-patient seen and evaluated. Patient is in discomfort. Afebrile. Unremarkable vital signs. Does have history of multiple visits in the past related to dysmenorrhea.  The patient continues to complain of significant abdominal pain discomfort. On reexam she still has diffuse tenderness without any specific focal pain. She has been able tolerate by mouth fluids without nausea vomiting. Will attempt additional medications. She has unremarkable laboratory testing. UA with trace leukocytes and 3-6 WBC and also appears contaminated. Do not suspect UTI. No significant hematuria. Do not suspect kidney stone.  Patient has not had any improvement after fentanyl or additional Reglan. We'll give small dose of morphine. I have discussed this with patient she is very concerned about her pain. Will order a CT scan abdomen and pelvis.  No signs of bowel obstruction. There is significant stool throughout concerning for constipation. This would be consistent with her diffuse abdominal tenderness. No other concerning findings. No diverticulitis. At this time we'll treat her constipation and recommend stool softeners.  Treatment plan initiated: Medications  morphine 4 MG/ML injection 4 mg (not administered)  sodium chloride 0.9 % bolus 1,000 mL (0 mLs Intravenous Stopped 04/10/14 0219)  promethazine (PHENERGAN) injection 12.5 mg (12.5 mg Intravenous Given 04/09/14 2313)  ketorolac (TORADOL) 30 MG/ML injection 30 mg (30 mg Intravenous Given 04/09/14 2313)  fentaNYL (SUBLIMAZE) injection 100 mcg (100 mcg Intravenous Given 04/10/14 0002)  metoCLOPramide (REGLAN) injection 10 mg (10 mg Intravenous Given 04/10/14 0219)  iohexol (OMNIPAQUE) 300 MG/ML solution 50 mL (50 mLs Oral Contrast Given 04/10/14 0215)   Results for orders placed during the hospital encounter of 04/09/14  CBC WITH DIFFERENTIAL      Result Value Ref Range   WBC 7.4  4.0 - 10.5 K/uL   RBC 4.21  3.87 -  5.11 MIL/uL   Hemoglobin 12.7  12.0 - 15.0 g/dL   HCT 37.0  36.0 - 46.0 %   MCV 87.9  78.0 - 100.0 fL   MCH 30.2  26.0 - 34.0 pg   MCHC 34.3  30.0 - 36.0 g/dL   RDW 14.0  11.5 - 15.5 %   Platelets 239  150 - 400 K/uL   Neutrophils Relative % 54  43 - 77 %   Lymphocytes Relative 35  12 - 46 %   Monocytes Relative 7  3 - 12 %   Eosinophils Relative 4  0 - 5 %   Basophils Relative 0  0 - 1 %   Neutro Abs 4.0  1.7 - 7.7 K/uL   Lymphs Abs 2.6  0.7 - 4.0 K/uL   Monocytes Absolute 0.5  0.1 - 1.0 K/uL   Eosinophils Absolute 0.3  0.0 - 0.7 K/uL   Basophils Absolute 0.0  0.0 - 0.1 K/uL   Smear Review LARGE PLATELETS PRESENT    COMPREHENSIVE METABOLIC PANEL      Result Value Ref Range  Sodium 137  137 - 147 mEq/L   Potassium 3.9  3.7 - 5.3 mEq/L   Chloride 104  96 - 112 mEq/L   CO2 19  19 - 32 mEq/L   Glucose, Bld 110 (*) 70 - 99 mg/dL   BUN 4 (*) 6 - 23 mg/dL   Creatinine, Ser 0.73  0.50 - 1.10 mg/dL   Calcium 9.7  8.4 - 10.5 mg/dL   Total Protein 7.3  6.0 - 8.3 g/dL   Albumin 3.7  3.5 - 5.2 g/dL   AST 15  0 - 37 U/L   ALT 8  0 - 35 U/L   Alkaline Phosphatase 81  39 - 117 U/L   Total Bilirubin <0.2 (*) 0.3 - 1.2 mg/dL   GFR calc non Af Amer >90  >90 mL/min   GFR calc Af Amer >90  >90 mL/min  URINALYSIS, ROUTINE W REFLEX MICROSCOPIC      Result Value Ref Range   Color, Urine YELLOW  YELLOW   APPearance CLOUDY (*) CLEAR   Specific Gravity, Urine 1.038 (*) 1.005 - 1.030   pH 6.0  5.0 - 8.0   Glucose, UA NEGATIVE  NEGATIVE mg/dL   Hgb urine dipstick MODERATE (*) NEGATIVE   Bilirubin Urine SMALL (*) NEGATIVE   Ketones, ur NEGATIVE  NEGATIVE mg/dL   Protein, ur 30 (*) NEGATIVE mg/dL   Urobilinogen, UA 1.0  0.0 - 1.0 mg/dL   Nitrite NEGATIVE  NEGATIVE   Leukocytes, UA TRACE (*) NEGATIVE  URINE MICROSCOPIC-ADD ON      Result Value Ref Range   Squamous Epithelial / LPF MANY (*) RARE   WBC, UA 3-6  <3 WBC/hpf   Bacteria, UA MANY (*) RARE   Crystals CA OXALATE CRYSTALS (*)  NEGATIVE         Imaging Review Ct Abdomen Pelvis W Contrast  04/10/2014   CLINICAL DATA:  Lower abdominal pain, nausea, vomiting and diarrhea. Red blood cells and white blood cells in the urine.  EXAM: CT ABDOMEN AND PELVIS WITH CONTRAST  TECHNIQUE: Multidetector CT imaging of the abdomen and pelvis was performed using the standard protocol following bolus administration of intravenous contrast.  CONTRAST:  157mL OMNIPAQUE IOHEXOL 300 MG/ML  SOLN  COMPARISON:  CT of the abdomen and pelvis performed 11/20/2013, and pelvic ultrasound performed 12/30/2013  FINDINGS: The visualized lung bases are clear.  The liver and spleen are unremarkable in appearance. The patient is status post cholecystectomy. The pancreas and adrenal glands are unremarkable.  The kidneys are unremarkable in appearance. There is no evidence of hydronephrosis. No renal or ureteral stones are seen. No perinephric stranding is appreciated.  No free fluid is identified. The small bowel is unremarkable in appearance. The stomach is within normal limits. Apparent wall thickening at the gastric antrum is grossly stable and likely reflects the patient's baseline. No acute vascular abnormalities are seen.  The appendix is difficult to fully assess but appears grossly unremarkable, without evidence for appendicitis.  The cecum, ascending colon and transverse colon are diffusely distended with stool, raising concern for constipation. The remainder of the colon is relatively decompressed.  The bladder is decompressed and not well assessed. An apparent small polyp or possible small fibroid is noted at the endometrial canal; the uterus is otherwise grossly unremarkable. The ovaries are relatively symmetric; no suspicious adnexal masses are seen. No inguinal lymphadenopathy is seen.  No acute osseous abnormalities are identified.  IMPRESSION: 1. The cecum, ascending colon and transverse colon are diffusely  distended with stool, raising concern for  constipation. The remainder of the colon is relatively decompressed. 2. Apparent small polyp or possible small fibroid at the endometrial canal. Uterus otherwise grossly unremarkable in appearance.   Electronically Signed   By: Garald Balding M.D.   On: 04/10/2014 03:36     MDM   Final diagnoses:  Constipation  Abdominal pain        Martie Lee, PA-C 04/10/14 [phone]

## 2014-04-10 ENCOUNTER — Emergency Department (HOSPITAL_COMMUNITY): Payer: PRIVATE HEALTH INSURANCE

## 2014-04-10 ENCOUNTER — Encounter (HOSPITAL_COMMUNITY): Payer: Self-pay

## 2014-04-10 LAB — CBC WITH DIFFERENTIAL/PLATELET
BASOS ABS: 0 10*3/uL (ref 0.0–0.1)
Basophils Relative: 0 % (ref 0–1)
Eosinophils Absolute: 0.3 10*3/uL (ref 0.0–0.7)
Eosinophils Relative: 4 % (ref 0–5)
HEMATOCRIT: 37 % (ref 36.0–46.0)
Hemoglobin: 12.7 g/dL (ref 12.0–15.0)
LYMPHS ABS: 2.6 10*3/uL (ref 0.7–4.0)
Lymphocytes Relative: 35 % (ref 12–46)
MCH: 30.2 pg (ref 26.0–34.0)
MCHC: 34.3 g/dL (ref 30.0–36.0)
MCV: 87.9 fL (ref 78.0–100.0)
MONO ABS: 0.5 10*3/uL (ref 0.1–1.0)
Monocytes Relative: 7 % (ref 3–12)
Neutro Abs: 4 10*3/uL (ref 1.7–7.7)
Neutrophils Relative %: 54 % (ref 43–77)
PLATELETS: 239 10*3/uL (ref 150–400)
RBC: 4.21 MIL/uL (ref 3.87–5.11)
RDW: 14 % (ref 11.5–15.5)
WBC: 7.4 10*3/uL (ref 4.0–10.5)

## 2014-04-10 MED ORDER — POLYETHYLENE GLYCOL 3350 17 GM/SCOOP PO POWD: 17.0000 g | Freq: Every day | ORAL | Status: DC

## 2014-04-10 MED ORDER — MORPHINE SULFATE 4 MG/ML IJ SOLN
4.0000 mg | Freq: Once | INTRAMUSCULAR | Status: AC
  Administered 2014-04-10: 4 mg via INTRAVENOUS
  Filled 2014-04-10: qty 1

## 2014-04-10 MED ORDER — IOHEXOL 300 MG/ML  SOLN
50.0000 mL | Freq: Once | INTRAMUSCULAR | Status: AC | PRN
  Administered 2014-04-10: 50 mL via ORAL

## 2014-04-10 MED ORDER — IOHEXOL 300 MG/ML  SOLN
100.0000 mL | Freq: Once | INTRAMUSCULAR | Status: AC | PRN
  Administered 2014-04-10: 100 mL via INTRAVENOUS

## 2014-04-10 MED ORDER — MAGNESIUM CITRATE PO SOLN
1.0000 | Freq: Once | ORAL | Status: AC
  Administered 2014-04-10: 1 via ORAL
  Filled 2014-04-10: qty 296

## 2014-04-10 MED ORDER — METOCLOPRAMIDE HCL 5 MG/ML IJ SOLN
10.0000 mg | Freq: Once | INTRAMUSCULAR | Status: AC
  Administered 2014-04-10: 10 mg via INTRAVENOUS
  Filled 2014-04-10: qty 2

## 2014-04-10 NOTE — ED Notes (Signed)
Pt ambulatory to BR with steady gait, pt states she could not drink PO contrast, stating she told the physician.

## 2014-04-10 NOTE — ED Notes (Signed)
Pt to CT via stretcher

## 2014-04-10 NOTE — ED Notes (Signed)
Pt states she was unable to drink bottle of Mag Citrate, pt states it make her abd hurt worse and needs something addt for pain.

## 2014-04-10 NOTE — ED Provider Notes (Signed)
Medical screening examination/treatment/procedure(s) were performed by non-physician practitioner and as supervising physician I was immediately available for consultation/collaboration.    Dorie Rank, MD 04/10/14 [phone]

## 2014-04-10 NOTE — Discharge Instructions (Signed)
Drink plenty of water to stay hydrated. Use stool softener to help with constipation.    Constipation Constipation is when a person has fewer than three bowel movements a week, has difficulty having a bowel movement, or has stools that are dry, hard, or larger than normal. As people grow older, constipation is more common. If you try to fix constipation with medicines that make you have a bowel movement (laxatives), the problem may get worse. Long-term laxative use may cause the muscles of the colon to become weak. A low-fiber diet, not taking in enough fluids, and taking certain medicines may make constipation worse.  CAUSES   Certain medicines, such as antidepressants, pain medicine, iron supplements, antacids, and water pills.   Certain diseases, such as diabetes, irritable bowel syndrome (IBS), thyroid disease, or depression.   Not drinking enough water.   Not eating enough fiber-rich foods.   Stress or travel.   Lack of physical activity or exercise.   Ignoring the urge to have a bowel movement.   Using laxatives too much.  SIGNS AND SYMPTOMS   Having fewer than three bowel movements a week.   Straining to have a bowel movement.   Having stools that are hard, dry, or larger than normal.   Feeling full or bloated.   Pain in the lower abdomen.   Not feeling relief after having a bowel movement.  DIAGNOSIS  Your health care provider will take a medical history and perform a physical exam. Further testing may be done for severe constipation. Some tests may include:  A barium enema X-ray to examine your rectum, colon, and, sometimes, your small intestine.   A sigmoidoscopy to examine your lower colon.   A colonoscopy to examine your entire colon. TREATMENT  Treatment will depend on the severity of your constipation and what is causing it. Some dietary treatments include drinking more fluids and eating more fiber-rich foods. Lifestyle treatments may include  regular exercise. If these diet and lifestyle recommendations do not help, your health care provider may recommend taking over-the-counter laxative medicines to help you have bowel movements. Prescription medicines may be prescribed if over-the-counter medicines do not work.  HOME CARE INSTRUCTIONS   Eat foods that have a lot of fiber, such as fruits, vegetables, whole grains, and beans.  Limit foods high in fat and processed sugars, such as french fries, hamburgers, cookies, candies, and soda.   A fiber supplement may be added to your diet if you cannot get enough fiber from foods.   Drink enough fluids to keep your urine clear or pale yellow.   Exercise regularly or as directed by your health care provider.   Go to the restroom when you have the urge to go. Do not hold it.   Only take over-the-counter or prescription medicines as directed by your health care provider. Do not take other medicines for constipation without talking to your health care provider first.  [place] IF:   You have bright red blood in your stool.   Your constipation lasts for more than 4 days or gets worse.   You have abdominal or rectal pain.   You have thin, pencil-like stools.   You have unexplained weight loss. MAKE SURE YOU:   Understand these instructions.  Will watch your condition.  Will get help right away if you are not doing well or get worse. Document Released: 07/09/2004 Document Revised: 10/16/2013 Document Reviewed: 07/23/2013 [facility] Patient Information 2015 [place], [place]. This information is not intended  to replace advice given to you by your health care provider. Make sure you discuss any questions you have with your health care provider.    Abdominal Pain, Women Abdominal (stomach, pelvic, or belly) pain can be caused by many things. It is important to tell your doctor:  The location of the pain.  Does it come and go or is it present all the  time?  Are there things that start the pain (eating certain foods, exercise)?  Are there other symptoms associated with the pain (fever, nausea, vomiting, diarrhea)? All of this is helpful to know when trying to find the cause of the pain. CAUSES   Stomach: virus or bacteria infection, or ulcer.  Intestine: appendicitis (inflamed appendix), regional ileitis (Crohn's disease), ulcerative colitis (inflamed colon), irritable bowel syndrome, diverticulitis (inflamed diverticulum of the colon), or cancer of the stomach or intestine.  Gallbladder disease or stones in the gallbladder.  Kidney disease, kidney stones, or infection.  Pancreas infection or cancer.  Fibromyalgia (pain disorder).  Diseases of the female organs:  Uterus: fibroid (non-cancerous) tumors or infection.  Fallopian tubes: infection or tubal pregnancy.  Ovary: cysts or tumors.  Pelvic adhesions (scar tissue).  Endometriosis (uterus lining tissue growing in the pelvis and on the pelvic organs).  Pelvic congestion syndrome (female organs filling up with blood just before the menstrual period).  Pain with the menstrual period.  Pain with ovulation (producing an egg).  Pain with an IUD (intrauterine device, birth control) in the uterus.  Cancer of the female organs.  Functional pain (pain not caused by a disease, may improve without treatment).  Psychological pain.  Depression. DIAGNOSIS  Your doctor will decide the seriousness of your pain by doing an examination.  Blood tests.  X-rays.  Ultrasound.  CT scan (computed tomography, special type of X-ray).  MRI (magnetic resonance imaging).  Cultures, for infection.  Barium enema (dye inserted in the large intestine, to better view it with X-rays).  Colonoscopy (looking in intestine with a lighted tube).  Laparoscopy (minor surgery, looking in abdomen with a lighted tube).  Major abdominal exploratory surgery (looking in abdomen with a large  incision). TREATMENT  The treatment will depend on the cause of the pain.   Many cases can be observed and treated at home.  Over-the-counter medicines recommended by your caregiver.  Prescription medicine.  Antibiotics, for infection.  Birth control pills, for painful periods or for ovulation pain.  Hormone treatment, for endometriosis.  Nerve blocking injections.  Physical therapy.  Antidepressants.  Counseling with a psychologist or psychiatrist.  Minor or major surgery. HOME CARE INSTRUCTIONS   Do not take laxatives, unless directed by your caregiver.  Take over-the-counter pain medicine only if ordered by your caregiver. Do not take aspirin because it can cause an upset stomach or bleeding.  Try a clear liquid diet (broth or water) as ordered by your caregiver. Slowly move to a bland diet, as tolerated, if the pain is related to the stomach or intestine.  Have a thermometer and take your temperature several times a day, and record it.  Bed rest and sleep, if it helps the pain.  Avoid sexual intercourse, if it causes pain.  Avoid stressful situations.  Keep your follow-up appointments and tests, as your caregiver orders.  If the pain does not go away with medicine or surgery, you may try:  Acupuncture.  Relaxation exercises (yoga, meditation).  Group therapy.  Counseling. SEEK MEDICAL CARE IF:   You notice certain foods cause stomach  pain.  Your home care treatment is not helping your pain.  You need stronger pain medicine.  You want your IUD removed.  You feel faint or lightheaded.  You develop nausea and vomiting.  You develop a rash.  You are having side effects or an allergy to your medicine. SEEK IMMEDIATE MEDICAL CARE IF:   Your pain does not go away or gets worse.  You have a fever.  Your pain is felt only in portions of the abdomen. The right side could possibly be appendicitis. The left lower portion of the abdomen could be  colitis or diverticulitis.  You are passing blood in your stools (bright red or black tarry stools, with or without vomiting).  You have blood in your urine.  You develop chills, with or without a fever.  You pass out. MAKE SURE YOU:   Understand these instructions.  Will watch your condition.  Will get help right away if you are not doing well or get worse. Document Released: 08/08/2007 Document Revised: 01/03/2012 Document Reviewed: 08/28/2009 [facility] Patient Information 2015 [place], [place]. This information is not intended to replace advice given to you by your health care provider. Make sure you discuss any questions you have with your health care provider.

## 2014-04-12 NOTE — ED Provider Notes (Signed)
Medical screening examination/treatment/procedure(s) were performed by non-physician practitioner and as supervising physician I was immediately available for consultation/collaboration.   EKG Interpretation None        Julianne Rice, MD 04/12/14 [phone]

## 2014-04-29 ENCOUNTER — Encounter (HOSPITAL_COMMUNITY): Payer: Self-pay | Admitting: Emergency Medicine

## 2014-04-29 ENCOUNTER — Emergency Department (HOSPITAL_COMMUNITY)
Admission: EM | Admit: 2014-04-29 | Discharge: 2014-04-29 | Disposition: A | Discharge status: Home / Self Care | Payer: Self-pay | Attending: Emergency Medicine | Admitting: Emergency Medicine

## 2014-04-29 DIAGNOSIS — Z8742 Personal history of other diseases of the female genital tract: Secondary | ICD-10-CM | POA: Insufficient documentation

## 2014-04-29 DIAGNOSIS — M25519 Pain in unspecified shoulder: Secondary | ICD-10-CM | POA: Insufficient documentation

## 2014-04-29 DIAGNOSIS — M25511 Pain in right shoulder: Secondary | ICD-10-CM

## 2014-04-29 DIAGNOSIS — R000 Tachycardia, unspecified: Secondary | ICD-10-CM | POA: Insufficient documentation

## 2014-04-29 DIAGNOSIS — Z760 Encounter for issue of repeat prescription: Secondary | ICD-10-CM | POA: Insufficient documentation

## 2014-04-29 DIAGNOSIS — Z791 Long term (current) use of non-steroidal anti-inflammatories (NSAID): Secondary | ICD-10-CM | POA: Insufficient documentation

## 2014-04-29 DIAGNOSIS — I10 Essential (primary) hypertension: Secondary | ICD-10-CM | POA: Insufficient documentation

## 2014-04-29 DIAGNOSIS — Z79899 Other long term (current) drug therapy: Secondary | ICD-10-CM | POA: Insufficient documentation

## 2014-04-29 MED ORDER — TRAMADOL HCL 50 MG PO TABS: 50.0000 mg | ORAL_TABLET | Freq: Four times a day (QID) | ORAL | Status: DC | PRN
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
## 2014-04-29 NOTE — Discharge Instructions (Signed)
Take tramadol as directed as needed for pain. Apply ice to your shoulder. Followup with the orthopedist as scheduled.  Medication Refill, Emergency Department We have refilled your medication today as a courtesy to you. It is best for your medical care, however, to take care of getting refills done through your primary caregiver's office. They have your records and can do a better job of follow-up than we can in the emergency department. On maintenance medications, we often only prescribe enough medications to get you by until you are able to see your regular caregiver. This is a more expensive way to refill medications. In the future, please plan for refills so that you will not have to use the emergency department for this. Thank you for your help. Your help allows [place] to better take care of the daily emergencies that enter our department. Document Released: 01/28/2004 Document Revised: 01/03/2012 Document Reviewed: 10/11/2005 [facility] Patient Information 2015 [place], [place]. This information is not intended to replace advice given to you by your health care provider. Make sure you discuss any questions you have with your health care provider.  Shoulder Pain The shoulder is the joint that connects your arms to your body. The bones that form the shoulder joint include the upper arm bone (humerus), the shoulder blade (scapula), and the collarbone (clavicle). The top of the humerus is shaped like a ball and fits into a rather flat socket on the scapula (glenoid cavity). A combination of muscles and strong, fibrous tissues that connect muscles to bones (tendons) support your shoulder joint and hold the ball in the socket. Small, fluid-filled sacs (bursae) are located in different areas of the joint. They act as cushions between the bones and the overlying soft tissues and help reduce friction between the gliding tendons and the bone as you move your arm. Your shoulder joint allows a wide range of motion in  your arm. This range of motion allows you to do things like scratch your back or throw a ball. However, this range of motion also makes your shoulder more prone to pain from overuse and injury. Causes of shoulder pain can originate from both injury and overuse and usually can be grouped in the following four categories:  Redness, swelling, and pain (inflammation) of the tendon (tendinitis) or the bursae (bursitis).  Instability, such as a dislocation of the joint.  Inflammation of the joint (arthritis).  Broken bone (fracture). HOME CARE INSTRUCTIONS   Apply ice to the sore area.  Put ice in a plastic bag.  Place a towel between your skin and the bag.  Leave the ice on for 15-20 minutes, 3-4 times per day for the first 2 days, or as directed by your health care provider.  Stop using cold packs if they do not help with the pain.  If you have a shoulder sling or immobilizer, wear it as long as your caregiver instructs. Only remove it to shower or bathe. Move your arm as little as possible, but keep your hand moving to prevent swelling.  Squeeze a soft ball or foam pad as much as possible to help prevent swelling.  Only take over-the-counter or prescription medicines for pain, discomfort, or fever as directed by your caregiver. SEEK MEDICAL CARE IF:   Your shoulder pain increases, or new pain develops in your arm, hand, or fingers.  Your hand or fingers become cold and numb.  Your pain is not relieved with medicines. SEEK IMMEDIATE MEDICAL CARE IF:   Your arm, hand, or  fingers are numb or tingling.  Your arm, hand, or fingers are significantly swollen or turn white or blue. MAKE SURE YOU:   Understand these instructions.  Will watch your condition.  Will get help right away if you are not doing well or get worse. Document Released: 07/21/2005 Document Revised: 10/16/2013 Document Reviewed: 09/25/2011 [facility] Patient Information 2015 [place], [place]. This information is  not intended to replace advice given to you by your health care provider. Make sure you discuss any questions you have with your health care provider.

## 2014-04-30 NOTE — ED Provider Notes (Signed)
Medical screening examination/treatment/procedure(s) were performed by non-physician practitioner and as supervising physician I was immediately available for consultation/collaboration.     Mirna Mires, MD 04/30/14 [phone]

## 2014-05-02 ENCOUNTER — Emergency Department (HOSPITAL_COMMUNITY): Payer: Self-pay

## 2014-05-02 ENCOUNTER — Emergency Department (HOSPITAL_COMMUNITY): Payer: PRIVATE HEALTH INSURANCE

## 2014-05-02 ENCOUNTER — Emergency Department (HOSPITAL_COMMUNITY)
Admission: EM | Admit: 2014-05-02 | Discharge: 2014-05-02 | Disposition: A | Discharge status: Home / Self Care | Payer: Self-pay | Attending: Emergency Medicine | Admitting: Emergency Medicine

## 2014-05-02 ENCOUNTER — Encounter (HOSPITAL_COMMUNITY): Payer: Self-pay | Admitting: Emergency Medicine

## 2014-05-02 DIAGNOSIS — K5900 Constipation, unspecified: Secondary | ICD-10-CM | POA: Insufficient documentation

## 2014-05-02 DIAGNOSIS — Z8742 Personal history of other diseases of the female genital tract: Secondary | ICD-10-CM | POA: Insufficient documentation

## 2014-05-02 DIAGNOSIS — R1084 Generalized abdominal pain: Secondary | ICD-10-CM | POA: Insufficient documentation

## 2014-05-02 DIAGNOSIS — Z79899 Other long term (current) drug therapy: Secondary | ICD-10-CM | POA: Insufficient documentation

## 2014-05-02 DIAGNOSIS — R000 Tachycardia, unspecified: Secondary | ICD-10-CM | POA: Insufficient documentation

## 2014-05-02 DIAGNOSIS — R112 Nausea with vomiting, unspecified: Secondary | ICD-10-CM | POA: Insufficient documentation

## 2014-05-02 DIAGNOSIS — I10 Essential (primary) hypertension: Secondary | ICD-10-CM | POA: Insufficient documentation

## 2014-05-02 DIAGNOSIS — R6883 Chills (without fever): Secondary | ICD-10-CM | POA: Insufficient documentation

## 2014-05-02 DIAGNOSIS — Z3202 Encounter for pregnancy test, result negative: Secondary | ICD-10-CM | POA: Insufficient documentation

## 2014-05-02 LAB — CBC WITH DIFFERENTIAL/PLATELET
BASOS ABS: 0 10*3/uL (ref 0.0–0.1)
BASOS PCT: 0 % (ref 0–1)
EOS ABS: 0 10*3/uL (ref 0.0–0.7)
Eosinophils Relative: 0 % (ref 0–5)
HCT: 41.8 % (ref 36.0–46.0)
Hemoglobin: 14.8 g/dL (ref 12.0–15.0)
Lymphocytes Relative: 11 % — ABNORMAL LOW (ref 12–46)
Lymphs Abs: 1.4 10*3/uL (ref 0.7–4.0)
MCH: 30.5 pg (ref 26.0–34.0)
MCHC: 35.4 g/dL (ref 30.0–36.0)
MCV: 86.2 fL (ref 78.0–100.0)
Monocytes Absolute: 0.6 10*3/uL (ref 0.1–1.0)
Monocytes Relative: 5 % (ref 3–12)
NEUTROS PCT: 84 % — AB (ref 43–77)
Neutro Abs: 9.9 10*3/uL — ABNORMAL HIGH (ref 1.7–7.7)
Platelets: 342 10*3/uL (ref 150–400)
RBC: 4.85 MIL/uL (ref 3.87–5.11)
RDW: 14.3 % (ref 11.5–15.5)
WBC: 11.9 10*3/uL — ABNORMAL HIGH (ref 4.0–10.5)

## 2014-05-02 LAB — URINALYSIS, ROUTINE W REFLEX MICROSCOPIC
GLUCOSE, UA: NEGATIVE mg/dL
KETONES UR: 15 mg/dL — AB
NITRITE: NEGATIVE
PH: 5.5 (ref 5.0–8.0)
Protein, ur: 100 mg/dL — AB
SPECIFIC GRAVITY, URINE: 1.024 (ref 1.005–1.030)
Urobilinogen, UA: 1 mg/dL (ref 0.0–1.0)

## 2014-05-02 LAB — URINE MICROSCOPIC-ADD ON

## 2014-05-02 LAB — POC URINE PREG, ED: Preg Test, Ur: NEGATIVE

## 2014-05-02 LAB — COMPREHENSIVE METABOLIC PANEL
ALBUMIN: 4.6 g/dL (ref 3.5–5.2)
ALT: 12 U/L (ref 0–35)
ANION GAP: 22 — AB (ref 5–15)
AST: 22 U/L (ref 0–37)
Alkaline Phosphatase: 95 U/L (ref 39–117)
BILIRUBIN TOTAL: 0.2 mg/dL — AB (ref 0.3–1.2)
BUN: 8 mg/dL (ref 6–23)
CHLORIDE: 102 meq/L (ref 96–112)
CO2: 16 mEq/L — ABNORMAL LOW (ref 19–32)
CREATININE: 0.85 mg/dL (ref 0.50–1.10)
Calcium: 10.7 mg/dL — ABNORMAL HIGH (ref 8.4–10.5)
GFR calc Af Amer: >90 mL/min (ref >90)
GFR calc non Af Amer: 82 mL/min — ABNORMAL LOW (ref >90)
Glucose, Bld: 170 mg/dL — ABNORMAL HIGH (ref 70–99)
Potassium: 3.5 mEq/L — ABNORMAL LOW (ref 3.7–5.3)
Sodium: 140 mEq/L (ref 137–147)
TOTAL PROTEIN: 8.9 g/dL — AB (ref 6.0–8.3)

## 2014-05-02 LAB — LIPASE, BLOOD: LIPASE: 15 U/L (ref 11–59)

## 2014-05-02 MED ORDER — CLONIDINE HCL 0.2 MG PO TABS
0.2000 mg | ORAL_TABLET | Freq: Three times a day (TID) | ORAL | Status: DC
Start: 2014-05-02 — End: 2014-07-12

## 2014-05-02 MED ORDER — POLYETHYLENE GLYCOL 3350 17 G PO PACK: 17.0000 g | PACK | Freq: Every day | ORAL | Status: DC

## 2014-05-02 MED ORDER — ONDANSETRON HCL 4 MG PO TABS: 4.0000 mg | ORAL_TABLET | Freq: Four times a day (QID) | ORAL | Status: DC

## 2014-05-02 MED ORDER — ONDANSETRON HCL 4 MG/2ML IJ SOLN
4.0000 mg | Freq: Once | INTRAMUSCULAR | Status: AC
  Administered 2014-05-02: 4 mg via INTRAVENOUS
  Filled 2014-05-02: qty 2

## 2014-05-02 MED ORDER — SODIUM CHLORIDE 0.9 % IV BOLUS (SEPSIS)
1000.0000 mL | Freq: Once | INTRAVENOUS | Status: AC
  Administered 2014-05-02: 1000 mL via INTRAVENOUS

## 2014-05-02 MED ORDER — DICYCLOMINE HCL 20 MG PO TABS: 20.0000 mg | ORAL_TABLET | Freq: Two times a day (BID) | ORAL | Status: DC

## 2014-05-02 MED ORDER — OXYCODONE-ACETAMINOPHEN 5-325 MG PO TABS
2.0000 | ORAL_TABLET | Freq: Once | ORAL | Status: AC
  Administered 2014-05-02: 2 via ORAL
  Filled 2014-05-02: qty 2

## 2014-05-02 MED ORDER — IOHEXOL 300 MG/ML  SOLN: 100.0000 mL | Freq: Once | INTRAMUSCULAR | Status: DC | PRN

## 2014-05-02 MED ORDER — HYDROMORPHONE HCL PF 1 MG/ML IJ SOLN
1.0000 mg | Freq: Once | INTRAMUSCULAR | Status: AC
  Administered 2014-05-02: 1 mg via INTRAVENOUS
  Filled 2014-05-02: qty 1

## 2014-05-02 MED ORDER — IOHEXOL 300 MG/ML  SOLN: 50.0000 mL | Freq: Once | INTRAMUSCULAR | Status: DC | PRN

## 2014-05-02 NOTE — ED Notes (Addendum)
Pt reports vomiting. Pt had a small amount of emesis collected in an emesis bag.

## 2014-05-02 NOTE — ED Notes (Signed)
Patient was educated not to drive, operate heavy machinery, or drink alcohol while taking narcotic medication.  

## 2014-05-02 NOTE — ED Notes (Signed)
Pt stating she cannot drink Contrast, EDP aware, states will do CT w/ IV contrast, CT-Laurie made aware.
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
## 2014-05-02 NOTE — ED Notes (Signed)
Pt. Made aware for the need of urine. 

## 2014-05-02 NOTE — Discharge Instructions (Signed)
Abdominal Pain, Women °Abdominal (stomach, pelvic, or belly) pain can be caused by many things. It is important to tell your doctor: °· The location of the pain. °· Does it come and go or is it present all the time? °· Are there things that start the pain (eating certain foods, exercise)? °· Are there other symptoms associated with the pain (fever, nausea, vomiting, diarrhea)? °All of this is helpful to know when trying to find the cause of the pain. °CAUSES  °· Stomach: virus or bacteria infection, or ulcer. °· Intestine: appendicitis (inflamed appendix), regional ileitis (Crohn's disease), ulcerative colitis (inflamed colon), irritable bowel syndrome, diverticulitis (inflamed diverticulum of the colon), or cancer of the stomach or intestine. °· Gallbladder disease or stones in the gallbladder. °· Kidney disease, kidney stones, or infection. °· Pancreas infection or cancer. °· Fibromyalgia (pain disorder). °· Diseases of the female organs: °¨ Uterus: fibroid (non-cancerous) tumors or infection. °¨ Fallopian tubes: infection or tubal pregnancy. °¨ Ovary: cysts or tumors. °¨ Pelvic adhesions (scar tissue). °¨ Endometriosis (uterus lining tissue growing in the pelvis and on the pelvic organs). °¨ Pelvic congestion syndrome (female organs filling up with blood just before the menstrual period). °¨ Pain with the menstrual period. °¨ Pain with ovulation (producing an egg). °¨ Pain with an IUD (intrauterine device, birth control) in the uterus. °¨ Cancer of the female organs. °· Functional pain (pain not caused by a disease, may improve without treatment). °· Psychological pain. °· Depression. °DIAGNOSIS  °Your doctor will decide the seriousness of your pain by doing an examination. °· Blood tests. °· X-rays. °· Ultrasound. °· CT scan (computed tomography, special type of X-ray). °· MRI (magnetic resonance imaging). °· Cultures, for infection. °· Barium enema (dye inserted in the large intestine, to better view it with  X-rays). °· Colonoscopy (looking in intestine with a lighted tube). °· Laparoscopy (minor surgery, looking in abdomen with a lighted tube). °· Major abdominal exploratory surgery (looking in abdomen with a large incision). °TREATMENT  °The treatment will depend on the cause of the pain.  °· Many cases can be observed and treated at home. °· Over-the-counter medicines recommended by your caregiver. °· Prescription medicine. °· Antibiotics, for infection. °· Birth control pills, for painful periods or for ovulation pain. °· Hormone treatment, for endometriosis. °· Nerve blocking injections. °· Physical therapy. °· Antidepressants. °· Counseling with a psychologist or psychiatrist. °· Minor or major surgery. °HOME CARE INSTRUCTIONS  °· Do not take laxatives, unless directed by your caregiver. °· Take over-the-counter pain medicine only if ordered by your caregiver. Do not take aspirin because it can cause an upset stomach or bleeding. °· Try a clear liquid diet (broth or water) as ordered by your caregiver. Slowly move to a bland diet, as tolerated, if the pain is related to the stomach or intestine. °· Have a thermometer and take your temperature several times a day, and record it. °· Bed rest and sleep, if it helps the pain. °· Avoid sexual intercourse, if it causes pain. °· Avoid stressful situations. °· Keep your follow-up appointments and tests, as your caregiver orders. °· If the pain does not go away with medicine or surgery, you may try: °¨ Acupuncture. °¨ Relaxation exercises (yoga, meditation). °¨ Group therapy. °¨ Counseling. °SEEK MEDICAL CARE IF:  °· You notice certain foods cause stomach pain. °· Your home care treatment is not helping your pain. °· You need stronger pain medicine. °· You want your IUD removed. °· You feel faint or   lightheaded. °· You develop nausea and vomiting. °· You develop a rash. °· You are having side effects or an allergy to your medicine. °SEEK IMMEDIATE MEDICAL CARE IF:  °· Your  pain does not go away or gets worse. °· You have a fever. °· Your pain is felt only in portions of the abdomen. The right side could possibly be appendicitis. The left lower portion of the abdomen could be colitis or diverticulitis. °· You are passing blood in your stools (bright red or black tarry stools, with or without vomiting). °· You have blood in your urine. °· You develop chills, with or without a fever. °· You pass out. °MAKE SURE YOU:  °· Understand these instructions. °· Will watch your condition. °· Will get help right away if you are not doing well or get worse. °Document Released: 08/08/2007 Document Revised: 01/03/2012 Document Reviewed: 08/28/2009 °[employer]® Patient Information ©2015 [employer]. This information is not intended to replace advice given to you by your health care provider. Make sure you discuss any questions you have with your health care provider. ° °Constipation °Constipation is when a person has fewer than three bowel movements a week, has difficulty having a bowel movement, or has stools that are dry, hard, or larger than normal. As people grow older, constipation is more common. If you try to fix constipation with medicines that make you have a bowel movement (laxatives), the problem may get worse. Long-term laxative use may cause the muscles of the colon to become weak. A low-fiber diet, not taking in enough fluids, and taking certain medicines may make constipation worse.  °CAUSES  °· Certain medicines, such as antidepressants, pain medicine, iron supplements, antacids, and water pills.   °· Certain diseases, such as diabetes, irritable bowel syndrome (IBS), thyroid disease, or depression.   °· Not drinking enough water.   °· Not eating enough fiber-rich foods.   °· Stress or travel.   °· Lack of physical activity or exercise.   °· Ignoring the urge to have a bowel movement.   °· Using laxatives too much.   °SIGNS AND SYMPTOMS  °· Having fewer than three bowel movements a  week.   °· Straining to have a bowel movement.   °· Having stools that are hard, dry, or larger than normal.   °· Feeling full or bloated.   °· Pain in the lower abdomen.   °· Not feeling relief after having a bowel movement.   °DIAGNOSIS  °Your health care provider will take a medical history and perform a physical exam. Further testing may be done for severe constipation. Some tests may include: °· A barium enema X-ray to examine your rectum, colon, and, sometimes, your small intestine.   °· A sigmoidoscopy to examine your lower colon.   °· A colonoscopy to examine your entire colon. °TREATMENT  °Treatment will depend on the severity of your constipation and what is causing it. Some dietary treatments include drinking more fluids and eating more fiber-rich foods. Lifestyle treatments may include regular exercise. If these diet and lifestyle recommendations do not help, your health care provider may recommend taking over-the-counter laxative medicines to help you have bowel movements. Prescription medicines may be prescribed if over-the-counter medicines do not work.  °HOME CARE INSTRUCTIONS  °· Eat foods that have a lot of fiber, such as fruits, vegetables, whole grains, and beans. °· Limit foods high in fat and processed sugars, such as french fries, hamburgers, cookies, candies, and soda.   °· A fiber supplement may be added to your diet if you cannot get enough fiber from foods.   °·   Drink enough fluids to keep your urine clear or pale yellow.   °· Exercise regularly or as directed by your health care provider.   °· Go to the restroom when you have the urge to go. Do not hold it.   °· Only take over-the-counter or prescription medicines as directed by your health care provider. Do not take other medicines for constipation without talking to your health care provider first.   °SEEK IMMEDIATE MEDICAL CARE IF:  °· You have bright red blood in your stool.   °· Your constipation lasts for more than 4 days or gets  worse.   °· You have abdominal or rectal pain.   °· You have thin, pencil-like stools.   °· You have unexplained weight loss. °MAKE SURE YOU:  °· Understand these instructions. °· Will watch your condition. °· Will get help right away if you are not doing well or get worse. °Document Released: 07/09/2004 Document Revised: 10/16/2013 Document Reviewed: 07/23/2013 °ExitCare® Patient Information ©2015 [employer]. This information is not intended to replace advice given to you by your health care provider. Make sure you discuss any questions you have with your health care provider. ° °

## 2014-05-02 NOTE — ED Provider Notes (Signed)
CSN: [id number]     Arrival date & time 05/02/14  0153 History   First MD Initiated Contact with Patient 05/02/14 0200     Chief Complaint  Patient presents with  . Emesis  . Nausea     (Consider location/radiation/quality/duration/timing/severity/associated sxs/prior Treatment) Patient is a 48 y.o. female presenting with vomiting. The history is provided by the patient. No language interpreter was used.  Emesis Severity:  Moderate Timing:  Intermittent Quality:  Stomach contents Progression:  Unchanged Chronicity:  New Recent urination:  Normal Relieved by:  Nothing Worsened by:  Nothing tried Ineffective treatments:  None tried Associated symptoms: abdominal pain and chills   Associated symptoms: no arthralgias, no cough, no diarrhea, no fever, no headaches and no sore throat   Abdominal pain:    Location:  Generalized   Quality:  Sharp and stabbing   Severity:  Severe   Duration:  7 hours   Timing:  Constant   Progression:  Waxing and waning   Chronicity:  New Risk factors: prior abdominal surgery   Risk factors: no alcohol use, no diabetes, not pregnant now, no sick contacts, no suspect food intake and no travel to endemic areas     Past Medical History  Diagnosis Date  . Hypertension   . Dysmenorrhea    Past Surgical History  Procedure Laterality Date  . Gallbladder surgery  2011  . Hand surgery  20 years ago    surgery due to infection per pt.   No family history on file. History  Substance Use Topics  . Smoking status: Never Smoker   . Smokeless tobacco: Not on file  . Alcohol Use: No   OB History   Grav Para Term Preterm Abortions TAB SAB Ect Mult Living                 Review of Systems  Constitutional: Positive for chills. Negative for fever, diaphoresis, activity change, appetite change and fatigue.  HENT: Negative for congestion, facial swelling, rhinorrhea and sore throat.   Eyes: Negative for photophobia and discharge.  Respiratory: Negative  for cough, chest tightness and shortness of breath.   Cardiovascular: Negative for chest pain, palpitations and leg swelling.  Gastrointestinal: Positive for vomiting and abdominal pain. Negative for nausea and diarrhea.  Endocrine: Negative for polydipsia and polyuria.  Genitourinary: Negative for dysuria, frequency, difficulty urinating and pelvic pain.  Musculoskeletal: Negative for arthralgias, back pain, neck pain and neck stiffness.  Skin: Negative for color change and wound.  Allergic/Immunologic: Negative for immunocompromised state.  Neurological: Negative for facial asymmetry, weakness, numbness and headaches.  Hematological: Does not bruise/bleed easily.  Psychiatric/Behavioral: Negative for confusion and agitation.      Allergies  Ciprofloxacin  Home Medications   Prior to Admission medications   Medication Sig Start Date End Date Taking? Authorizing Provider  cloNIDine (CATAPRES) 0.2 MG tablet Take 1 tablet (0.2 mg total) by mouth 3 (three) times daily. 03/21/14  Yes Carlisle Cater, PA-C  cloNIDine (CATAPRES) 0.2 MG tablet Take 1 tablet (0.2 mg total) by mouth 3 (three) times daily. 05/02/14   Neta Ehlers, MD  dicyclomine (BENTYL) 20 MG tablet Take 1 tablet (20 mg total) by mouth 2 (two) times daily. 05/02/14   Neta Ehlers, MD  ondansetron (ZOFRAN) 4 MG tablet Take 1 tablet (4 mg total) by mouth every 6 (six) hours. 05/02/14   Neta Ehlers, MD  polyethylene glycol (MIRALAX / GLYCOLAX) packet Take 17 g by mouth daily. 05/02/14  Neta Ehlers, MD   BP 137/92  Pulse 98  Temp(Src) 98.3 F (36.8 C) (Oral)  Resp 16  SpO2 100%  LMP 04/28/2014 Physical Exam  Constitutional: She is oriented to person, place, and time. She appears well-developed and well-nourished. No distress.  HENT:  Head: Normocephalic and atraumatic.  Mouth/Throat: No oropharyngeal exudate.  Eyes: Pupils are equal, round, and reactive to light.  Neck: Normal range of motion. Neck supple.   Cardiovascular: Regular rhythm and normal heart sounds.  Tachycardia present.  Exam reveals no gallop and no friction rub.   No murmur heard. Pulmonary/Chest: Effort normal and breath sounds normal. No respiratory distress. She has no wheezes. She has no rales.  Abdominal: Soft. Bowel sounds are normal. She exhibits no distension and no mass. There is tenderness. There is no rigidity, no rebound and no guarding.  Musculoskeletal: Normal range of motion. She exhibits no edema and no tenderness.  Neurological: She is alert and oriented to person, place, and time.  Skin: Skin is warm and dry.  Psychiatric: She has a normal mood and affect.    ED Course  Procedures (including critical care time) Labs Review Labs Reviewed  CBC WITH DIFFERENTIAL - Abnormal; Notable for the following:    WBC 11.9 (*)    Neutrophils Relative % 84 (*)    Neutro Abs 9.9 (*)    Lymphocytes Relative 11 (*)    All other components within normal limits  COMPREHENSIVE METABOLIC PANEL - Abnormal; Notable for the following:    Potassium 3.5 (*)    CO2 16 (*)    Glucose, Bld 170 (*)    Calcium 10.7 (*)    Total Protein 8.9 (*)    Total Bilirubin 0.2 (*)    GFR calc non Af Amer 82 (*)    Anion gap 22 (*)    All other components within normal limits  URINALYSIS, ROUTINE W REFLEX MICROSCOPIC - Abnormal; Notable for the following:    Color, Urine AMBER (*)    APPearance CLOUDY (*)    Hgb urine dipstick LARGE (*)    Bilirubin Urine SMALL (*)    Ketones, ur 15 (*)    Protein, ur 100 (*)    Leukocytes, UA SMALL (*)    All other components within normal limits  URINE MICROSCOPIC-ADD ON - Abnormal; Notable for the following:    Squamous Epithelial / LPF FEW (*)    Bacteria, UA MANY (*)    Crystals CA OXALATE CRYSTALS (*)    All other components within normal limits  LIPASE, BLOOD  POC URINE PREG, ED    Imaging Review Ct Abdomen Pelvis W Contrast  05/02/2014   CLINICAL DATA:  Abdominal pain, nausea and  vomiting for several hours.  EXAM: CT ABDOMEN AND PELVIS WITH CONTRAST  TECHNIQUE: Multidetector CT imaging of the abdomen and pelvis was performed using the standard protocol following bolus administration of intravenous contrast.  CONTRAST:  100 cc Omnipaque 300.  COMPARISON:  CT abdomen and pelvis 04/10/2014 and 11/20/2013. Chest and two views abdomen earlier this same day. Pelvic ultrasound 12/30/2013.  FINDINGS: The lung bases are clear.  No pleural or pericardial effusion.  The patient is status post cholecystectomy. The liver, spleen, adrenal glands, pancreas, right kidney and biliary tree all appear normal. 0.5 cm nonobstructing stone is again seen in the midpole of the left kidney. There is no evidence of bowel obstruction. The patient has a very large volume of stool throughout the colon. The stomach, small  bowel and appendix appear normal. Possible endometrial polyp described on report of prior exams is not as well seen today. Adnexae and urinary bladder appear normal. There is no lymphadenopathy or fluid. No focal bony abnormality is identified.  IMPRESSION: No acute finding.  Very large volume of stool throughout the colon.  0.5 cm nonobstructing stone midpole left kidney.  Possible endometrial polyp is noted as seen on prior studies.   Electronically Signed   By: Inge Rise M.D.   On: 05/02/2014 07:27   Dg Abd Acute W/chest  05/02/2014   CLINICAL DATA:  diffuse ab pain, n/v  EXAM: ACUTE ABDOMEN SERIES (ABDOMEN 2 VIEW & CHEST 1 VIEW)  COMPARISON:  Prior CT from 04/10/2014  FINDINGS: The cardiac and mediastinal silhouettes are stable in size and contour, and remain within normal limits.  The lungs are normally inflated. No airspace consolidation, pleural effusion, or pulmonary edema is identified. There is no pneumothorax. Linear opacity overlying the left lung base is most compatible with a skin fold.  Visualized bowel gas pattern is nonobstructive. Large amount of retained stool seen diffusely  throughout the colon, suggesting constipation. No abnormal bowel wall thickening. No free intraperitoneal air. No soft tissue mass or abnormal calcification.  No acute osseous abnormality.  IMPRESSION: 1. Nonobstructive bowel gas pattern. 2. Large amount of retained stool within the colon, suggesting constipation. 3. No acute cardiopulmonary abnormality.   Electronically Signed   By: Jeannine Boga M.D.   On: 05/02/2014 04:15     EKG Interpretation None     CLINICAL DATA: Lower abdominal pain, nausea, vomiting and diarrhea.  Red blood cells and white blood cells in the urine.  EXAM:  CT ABDOMEN AND PELVIS WITH CONTRAST  TECHNIQUE:  Multidetector CT imaging of the abdomen and pelvis was performed  using the standard protocol following bolus administration of  intravenous contrast.  CONTRAST: 144mL OMNIPAQUE IOHEXOL 300 MG/ML SOLN  COMPARISON: CT of the abdomen and pelvis performed 11/20/2013, and  pelvic ultrasound performed 12/30/2013  FINDINGS:  The visualized lung bases are clear.  The liver and spleen are unremarkable in appearance. The patient is  status post cholecystectomy. The pancreas and adrenal glands are  unremarkable.  The kidneys are unremarkable in appearance. There is no evidence of  hydronephrosis. No renal or ureteral stones are seen. No perinephric  stranding is appreciated.  No free fluid is identified. The small bowel is unremarkable in  appearance. The stomach is within normal limits. Apparent wall  thickening at the gastric antrum is grossly stable and likely  reflects the patient's baseline. No acute vascular abnormalities are  seen.  The appendix is difficult to fully assess but appears grossly  unremarkable, without evidence for appendicitis.  The cecum, ascending colon and transverse colon are diffusely  distended with stool, raising concern for constipation. The  remainder of the colon is relatively decompressed.  The bladder is decompressed and  not well assessed. An apparent small  polyp or possible small fibroid is noted at the endometrial canal;  the uterus is otherwise grossly unremarkable. The ovaries are  relatively symmetric; no suspicious adnexal masses are seen. No  inguinal lymphadenopathy is seen.  No acute osseous abnormalities are identified.  IMPRESSION:  1. The cecum, ascending colon and transverse colon are diffusely  distended with stool, raising concern for constipation. The  remainder of the colon is relatively decompressed.  2. Apparent small polyp or possible small fibroid at the endometrial  canal. Uterus otherwise grossly unremarkable in appearance.  Electronically Signed  By: Garald Balding M.D.  On: 04/10/2014 03:36  MDM   Final diagnoses:  Non-intractable vomiting with nausea, vomiting of unspecified type  Generalized abdominal pain  Constipation, unspecified constipation type    Pt is a 48 y.o. female with Pmhx as above who presents with about 7 hrs of n/v, chills, & generalized abdominal pain. No d/a, dysuria, vag bleeding of d/c. Currently menstruating. On PE Pt tachycardic, uncomfortable, but in NAD. +generalized ttp of abdomen w/o rebound or guarding. She has had 13 ED visits in past 6 months with 0 admits. She had a CT ab/pelvis 3 weeks ago w/ diffuse stool as above. Abdominal exam similar during today's visit. She has hs of refusing imaging, and reporting she has vomited up PO narcotics.   3:24Pm Pt feeling no better. HR now 105. Labs so far w/ new leukocytosis. Had ordered CT ab/pelvis, but pt has declinde this saying she has received a letter from her insurance company sayin gshe has had too many and that they are too expensive. AAS ordered.   AAS with large amt of stool in colon. Pt states she feels "worse than when I came in". I have explained that if that is the case, then we should proceed with the scan. She requests more pain meds because she "threw up those pills". There are no pills in  the emesis bags she shows me.  She agrees to drink the PO contrast for the scan if she gets pain medicine.  .  CT w/ large amt colonic stool. Will d/c home w/ zofran, bentyl and miralax. No narcotics given constipation. She can otherwise f/u with her PCP. Return precautions given for new or worsening symptoms including worsening pain, fever, inability to tolerate PO.       Neta Ehlers, MD 05/02/14 [phone]

## 2014-05-02 NOTE — ED Notes (Signed)
Bed: QA83 Expected date: 05/02/14 Expected time: 1:45 AM Means of arrival: Ambulance Comments: 48 yo F  N/V/D

## 2014-05-02 NOTE — ED Notes (Signed)
Pt waiting to ask question of MD, MD on cell phone.

## 2014-05-02 NOTE — ED Notes (Signed)
Pt refusing to have CT scan at this time, EDP made aware, pt made aware xray was ordered instead, pt okay with this.

## 2014-05-10 ENCOUNTER — Encounter (HOSPITAL_COMMUNITY): Payer: Self-pay | Admitting: *Deleted

## 2014-05-10 ENCOUNTER — Inpatient Hospital Stay (HOSPITAL_COMMUNITY)
Admission: AD | Admit: 2014-05-10 | Discharge: 2014-05-10 | Disposition: A | Discharge status: Home / Self Care | Payer: No Typology Code available for payment source | Source: Ambulatory Visit | Attending: Obstetrics & Gynecology | Admitting: Obstetrics & Gynecology

## 2014-05-10 DIAGNOSIS — N946 Dysmenorrhea, unspecified: Secondary | ICD-10-CM | POA: Insufficient documentation

## 2014-05-10 DIAGNOSIS — R109 Unspecified abdominal pain: Secondary | ICD-10-CM | POA: Insufficient documentation

## 2014-05-10 DIAGNOSIS — I10 Essential (primary) hypertension: Secondary | ICD-10-CM | POA: Insufficient documentation

## 2014-05-10 DIAGNOSIS — R102 Pelvic and perineal pain: Secondary | ICD-10-CM

## 2014-05-10 DIAGNOSIS — N949 Unspecified condition associated with female genital organs and menstrual cycle: Secondary | ICD-10-CM | POA: Insufficient documentation

## 2014-05-10 LAB — URINALYSIS, ROUTINE W REFLEX MICROSCOPIC
BILIRUBIN URINE: NEGATIVE
GLUCOSE, UA: NEGATIVE mg/dL
HGB URINE DIPSTICK: NEGATIVE
Ketones, ur: NEGATIVE mg/dL
Leukocytes, UA: NEGATIVE
Nitrite: NEGATIVE
PH: 7 (ref 5.0–8.0)
Protein, ur: NEGATIVE mg/dL
Urobilinogen, UA: 1 mg/dL (ref 0.0–1.0)

## 2014-05-10 LAB — WET PREP, GENITAL
TRICH WET PREP: NONE SEEN
YEAST WET PREP: NONE SEEN

## 2014-05-10 MED ORDER — KETOROLAC TROMETHAMINE 10 MG PO TABS: 10.0000 mg | ORAL_TABLET | Freq: Four times a day (QID) | ORAL | Status: DC | PRN

## 2014-05-10 MED ORDER — KETOROLAC TROMETHAMINE 60 MG/2ML IM SOLN
60.0000 mg | Freq: Once | INTRAMUSCULAR | Status: AC
  Administered 2014-05-10: 60 mg via INTRAMUSCULAR
  Filled 2014-05-10: qty 2

## 2014-05-10 NOTE — MAU Note (Signed)
Urine in lab 

## 2014-05-10 NOTE — MAU Note (Signed)
Something going around in bottom of stomach.  Was seen at [facility] a couple wks ago- was told constipation; took all the meds as instructed is having bowel movements and the pain continues.  Pain in lower back and low abd; almost feels like a UTI

## 2014-05-10 NOTE — MAU Provider Note (Signed)
History     CSN: [id number]  Arrival date and time: 05/10/14 1420   First Provider Initiated Contact with Patient 05/10/14 1756      No chief complaint on file.  HPI  Regina Estes is a 48 y.o. [id number] who presents today with lower abdominal pain. She has had this pain for more than a month. She was in the ED on  05/02/14, and had a CT scan on that day. It showed a large amount of stool in the colon. She stats that she took a laxative, but she still has pain. She took ibuprofen 2 days ago, but it did not help with the pain. She has not taken anything for the pain today. She states that Dr. Yehuda Mao is her PCP, and he "couldn't find anything wrong with me", and she has an appointment with an OBGYN on 8/17, but she can't remember the name of who she will be seeing.   Past Medical History  Diagnosis Date  . Hypertension   . Dysmenorrhea     Past Surgical History  Procedure Laterality Date  . Gallbladder surgery  2011  . Hand surgery  20 years ago    surgery due to infection per pt.    History reviewed. No pertinent family history.  History  Substance Use Topics  . Smoking status: Never Smoker   . Smokeless tobacco: Not on file  . Alcohol Use: No     Comment: LAST TIME-   2 MTHS AGO    Allergies:  Allergies  Allergen Reactions  . Ciprofloxacin Other (See Comments)    sores and blisters    Prescriptions prior to admission  Medication Sig Dispense Refill  . cloNIDine (CATAPRES) 0.2 MG tablet Take 1 tablet (0.2 mg total) by mouth 3 (three) times daily.  30 tablet  90  . polyethylene glycol (MIRALAX / GLYCOLAX) packet Take 17 g by mouth daily.  14 each  0    ROS Physical Exam   Blood pressure 115/86, pulse 90, temperature 98.9 F (37.2 C), temperature source Oral, resp. rate 18, height 5' 3.78" (1.62 m), weight 55.566 kg (122 lb 8 oz), last menstrual period 04/28/2014.  Physical Exam  MAU Course  Procedures  Results for orders placed during the hospital encounter of  05/10/14 (from the past 24 hour(s))  URINALYSIS, ROUTINE W REFLEX MICROSCOPIC     Status: Abnormal   Collection Time    05/10/14  2:47 PM      Result Value Ref Range   Color, Urine YELLOW  YELLOW   APPearance HAZY (*) CLEAR   Specific Gravity, Urine <1.005 (*) 1.005 - 1.030   pH 7.0  5.0 - 8.0   Glucose, UA NEGATIVE  NEGATIVE mg/dL   Hgb urine dipstick NEGATIVE  NEGATIVE   Bilirubin Urine NEGATIVE  NEGATIVE   Ketones, ur NEGATIVE  NEGATIVE mg/dL   Protein, ur NEGATIVE  NEGATIVE mg/dL   Urobilinogen, UA 1.0  0.0 - 1.0 mg/dL   Nitrite NEGATIVE  NEGATIVE   Leukocytes, UA NEGATIVE  NEGATIVE  WET PREP, GENITAL     Status: Abnormal   Collection Time    05/10/14  6:06 PM      Result Value Ref Range   Yeast Wet Prep HPF POC NONE SEEN  NONE SEEN   Trich, Wet Prep NONE SEEN  NONE SEEN   Clue Cells Wet Prep HPF POC FEW (*) NONE SEEN   WBC, Wet Prep HPF POC FEW (*) NONE SEEN   1908: Patient  was sleeping in her room after having toradol. The RN had to awaken the patient to ask about her current pain level. Once she was awoken she said she was still in pain.   Assessment and Plan   1. Pelvic pain in female    Urine culture and GC/CT pending Return to MAU as needed    Medication List         cloNIDine 0.2 MG tablet  Commonly known as:  CATAPRES  Take 1 tablet (0.2 mg total) by mouth 3 (three) times daily.     ketorolac 10 MG tablet  Commonly known as:  TORADOL  Take 1 tablet (10 mg total) by mouth every 6 (six) hours as needed.     polyethylene glycol packet  Commonly known as:  MIRALAX / GLYCOLAX  Take 17 g by mouth daily.       Follow-up Information   Schedule an appointment as soon as possible for a visit to follow up.   Contact information:   [place]        Mathis Bud 05/10/2014, 5:58 PM

## 2014-05-10 NOTE — Discharge Instructions (Signed)
Pelvic Pain, Female Female pelvic pain can be caused by many different things and start from a variety of places. Pelvic pain refers to pain that is located in the lower half of the abdomen and between your hips. The pain may occur over a short period of time (acute) or may be reoccurring (chronic). The cause of pelvic pain may be related to disorders affecting the female reproductive organs (gynecologic), but it may also be related to the bladder, kidney stones, an intestinal complication, or muscle or skeletal problems. Getting help right away for pelvic pain is important, especially if there has been severe, sharp, or a sudden onset of unusual pain. It is also important to get help right away because some types of pelvic pain can be life threatening.  CAUSES  Below are only some of the causes of pelvic pain. The causes of pelvic pain can be in one of several categories.   Gynecologic.  Pelvic inflammatory disease.  Sexually transmitted infection.  Ovarian cyst or a twisted ovarian ligament (ovarian torsion).  Uterine lining that grows outside the uterus (endometriosis).  Fibroids, cysts, or tumors.  Ovulation.  Pregnancy.  Pregnancy that occurs outside the uterus (ectopic pregnancy).  Miscarriage.  Labor.  Abruption of the placenta or ruptured uterus.  Infection.  Uterine infection (endometritis).  Bladder infection.  Diverticulitis.  Miscarriage related to a uterine infection (septic abortion).  Bladder.  Inflammation of the bladder (cystitis).  Kidney stone(s).  Gastrointenstinal.  Constipation.  Diverticulitis.  Neurologic.  Trauma.  Feeling pelvic pain because of mental or emotional causes (psychosomatic).  Cancers of the bowel or pelvis. EVALUATION  Your caregiver will want to take a careful history of your concerns. This includes recent changes in your health, a careful gynecologic history of your periods (menses), and a sexual history. Obtaining  your family history and medical history is also important. Your caregiver may suggest a pelvic exam. A pelvic exam will help identify the location and severity of the pain. It also helps in the evaluation of which organ system may be involved. In order to identify the cause of the pelvic pain and be properly treated, your caregiver may order tests. These tests may include:   A pregnancy test.  Pelvic ultrasonography.  An X-ray exam of the abdomen.  A urinalysis or evaluation of vaginal discharge.  Blood tests. HOME CARE INSTRUCTIONS   Only take over-the-counter or prescription medicines for pain, discomfort, or fever as directed by your caregiver.   Rest as directed by your caregiver.   Eat a balanced diet.   Drink enough fluids to make your urine clear or pale yellow, or as directed.   Avoid sexual intercourse if it causes pain.   Apply warm or cold compresses to the lower abdomen depending on which one helps the pain.   Avoid stressful situations.   Keep a journal of your pelvic pain. Write down when it started, where the pain is located, and if there are things that seem to be associated with the pain, such as food or your menstrual cycle.  Follow up with your caregiver as directed.  SEEK MEDICAL CARE IF:  Your medicine does not help your pain.  You have abnormal vaginal discharge. SEEK IMMEDIATE MEDICAL CARE IF:   You have heavy bleeding from the vagina.   Your pelvic pain increases.   You feel lightheaded or faint.   You have chills.   You have pain with urination or blood in your urine.   You have uncontrolled  diarrhea or vomiting.   You have a fever or persistent symptoms for more than 3 days.  You have a fever and your symptoms suddenly get worse.   You are being physically or sexually abused.  MAKE SURE YOU:  Understand these instructions.  Will watch your condition.  Will get help if you are not doing well or get worse. Document  Released: 09/07/2004 Document Revised: 04/11/2012 Document Reviewed: 01/31/2012 [facility] Patient Information 2015 [place], [place]. This information is not intended to replace advice given to you by your health care provider. Make sure you discuss any questions you have with your health care provider.
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
## 2014-05-11 LAB — URINE CULTURE

## 2014-05-11 LAB — GC/CHLAMYDIA PROBE AMP
CT PROBE, AMP APTIMA: NEGATIVE
GC Probe RNA: NEGATIVE

## 2014-05-13 NOTE — MAU Provider Note (Signed)
Attestation of Attending Supervision of Advanced Practitioner: Evaluation and management procedures were performed by the PA/NP/CNM/OB Fellow under my supervision/collaboration. Chart reviewed and agree with management and plan.  Lonie Rummell V 05/13/2014 6:27 AM

## 2014-05-16 ENCOUNTER — Emergency Department (HOSPITAL_COMMUNITY)
Admission: EM | Admit: 2014-05-16 | Discharge: 2014-05-16 | Disposition: A | Discharge status: Home / Self Care | Payer: No Typology Code available for payment source | Attending: Emergency Medicine | Admitting: Emergency Medicine

## 2014-05-16 ENCOUNTER — Encounter (HOSPITAL_COMMUNITY): Payer: Self-pay | Admitting: Emergency Medicine

## 2014-05-16 DIAGNOSIS — Z79899 Other long term (current) drug therapy: Secondary | ICD-10-CM | POA: Insufficient documentation

## 2014-05-16 DIAGNOSIS — Z8742 Personal history of other diseases of the female genital tract: Secondary | ICD-10-CM | POA: Insufficient documentation

## 2014-05-16 DIAGNOSIS — Z791 Long term (current) use of non-steroidal anti-inflammatories (NSAID): Secondary | ICD-10-CM | POA: Insufficient documentation

## 2014-05-16 DIAGNOSIS — Y9389 Activity, other specified: Secondary | ICD-10-CM | POA: Insufficient documentation

## 2014-05-16 DIAGNOSIS — X503XXA Overexertion from repetitive movements, initial encounter: Secondary | ICD-10-CM | POA: Insufficient documentation

## 2014-05-16 DIAGNOSIS — S46819A Strain of other muscles, fascia and tendons at shoulder and upper arm level, unspecified arm, initial encounter: Principal | ICD-10-CM

## 2014-05-16 DIAGNOSIS — S46811D Strain of other muscles, fascia and tendons at shoulder and upper arm level, right arm, subsequent encounter: Secondary | ICD-10-CM

## 2014-05-16 DIAGNOSIS — I10 Essential (primary) hypertension: Secondary | ICD-10-CM | POA: Insufficient documentation

## 2014-05-16 DIAGNOSIS — S43499A Other sprain of unspecified shoulder joint, initial encounter: Secondary | ICD-10-CM | POA: Insufficient documentation

## 2014-05-16 DIAGNOSIS — M25519 Pain in unspecified shoulder: Secondary | ICD-10-CM | POA: Insufficient documentation

## 2014-05-16 DIAGNOSIS — Y929 Unspecified place or not applicable: Secondary | ICD-10-CM | POA: Insufficient documentation

## 2014-05-16 MED ORDER — NAPROXEN 500 MG PO TABS: 500.0000 mg | ORAL_TABLET | Freq: Two times a day (BID) | ORAL | Status: DC

## 2014-05-16 MED ORDER — CYCLOBENZAPRINE HCL 5 MG PO TABS: 5.0000 mg | ORAL_TABLET | Freq: Three times a day (TID) | ORAL | Status: DC | PRN
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
## 2014-05-16 NOTE — ED Notes (Signed)
Pt alert and oriented x4. Respirations even and unlabored, bilateral symmetrical rise and fall of chest. Skin warm and dry. In no acute distress. Denies needs.   

## 2014-05-16 NOTE — Progress Notes (Signed)
[facility] provided pt with a list of primary care resources and a [facility] [place] Card application to help patient establish primary care.  °
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
## 2014-05-16 NOTE — Discharge Instructions (Signed)
Use ice and heat to your shoulder. Take the medications as prescribed. You need to see an orthopedist about your chronic pain in your shoulder.   Chronic Pain Discharge Instructions  Emergency care providers appreciate that many patients coming to [place] are in severe pain and we wish to address their pain in the safest, most responsible manner.  It is important to recognize however, that the proper treatment of chronic pain differs from that of the pain of injuries and acute illnesses.  Our goal is to provide quality, safe, personalized care and we thank you for giving [place] the opportunity to serve you. The use of narcotics and related agents for chronic pain syndromes may lead to additional physical and psychological problems.  Nearly as many people die from prescription narcotics each year as die from car crashes.  Additionally, this risk is increased if such prescriptions are obtained from a variety of sources.  Therefore, only your primary care physician or a pain management specialist is able to safely treat such syndromes with narcotic medications long-term.    Documentation revealing such prescriptions have been sought from multiple sources may prohibit [place] from providing a refill or different narcotic medication.  Your name may be checked first through the [place].  This database is a record of controlled substance medication prescriptions that the patient has received.  This has been established by [facility] in an effort to eliminate the dangerous, and often life threatening, practice of obtaining multiple prescriptions from different medical providers.   If you have a chronic pain syndrome (i.e. chronic headaches, recurrent back or neck pain, dental pain, abdominal or pelvis pain without a specific diagnosis, or neuropathic pain such as fibromyalgia) or recurrent visits for the same condition without an acute diagnosis, you may be treated with  non-narcotics and other non-addictive medicines.  Allergic reactions or negative side effects that may be reported by a patient to such medications will not typically lead to the use of a narcotic analgesic or other controlled substance as an alternative.   Patients managing chronic pain with a personal physician should have provisions in place for breakthrough pain.  If you are in crisis, you should call your physician.  If your physician directs you to the emergency department, please have the doctor call and speak to our attending physician concerning your care.   When patients come to the Emergency Department (ED) with acute medical conditions in which the Emergency Department physician feels appropriate to prescribe narcotic or sedating pain medication, the physician will prescribe these in very limited quantities.  The amount of these medications will last only until you can see your primary care physician in his/her office.  Any patient who returns to the ED seeking refills should expect only non-narcotic pain medications.   In the event of an acute medical condition exists and the emergency physician feels it is necessary that the patient be given a narcotic or sedating medication -  a responsible adult driver should be present in the room prior to the medication being given by the nurse.   Prescriptions for narcotic or sedating medications that have been lost, stolen or expired will not be refilled in the Emergency Department.    Patients who have chronic pain may receive non-narcotic prescriptions until seen by their primary care physician.  It is every patients personal responsibility to maintain active prescriptions with his or her primary care physician or specialist.

## 2014-07-12 ENCOUNTER — Emergency Department (HOSPITAL_COMMUNITY)
Admission: EM | Admit: 2014-07-12 | Discharge: 2014-07-12 | Disposition: A | Discharge status: Home / Self Care | Payer: No Typology Code available for payment source | Attending: Emergency Medicine | Admitting: Emergency Medicine

## 2014-07-12 ENCOUNTER — Encounter (HOSPITAL_COMMUNITY): Payer: Self-pay | Admitting: Emergency Medicine

## 2014-07-12 DIAGNOSIS — I10 Essential (primary) hypertension: Secondary | ICD-10-CM | POA: Insufficient documentation

## 2014-07-12 DIAGNOSIS — Z760 Encounter for issue of repeat prescription: Secondary | ICD-10-CM | POA: Insufficient documentation

## 2014-07-12 DIAGNOSIS — Z79899 Other long term (current) drug therapy: Secondary | ICD-10-CM | POA: Insufficient documentation

## 2014-07-12 DIAGNOSIS — Z8742 Personal history of other diseases of the female genital tract: Secondary | ICD-10-CM | POA: Insufficient documentation

## 2014-07-12 MED ORDER — IBUPROFEN 800 MG PO TABS: 800.0000 mg | ORAL_TABLET | Freq: Three times a day (TID) | ORAL | Status: DC

## 2014-07-12 MED ORDER — CLONIDINE HCL 0.2 MG PO TABS: 0.2000 mg | ORAL_TABLET | Freq: Two times a day (BID) | ORAL | Status: DC

## 2014-07-12 MED ORDER — IBUPROFEN 800 MG PO TABS
800.0000 mg | ORAL_TABLET | Freq: Once | ORAL | Status: AC
  Administered 2014-07-12: 800 mg via ORAL
  Filled 2014-07-12: qty 1

## 2014-07-12 NOTE — ED Provider Notes (Signed)
CSN: [id number]     Arrival date & time 07/12/14  0630 History   First MD Initiated Contact with Patient 07/12/14 1014     Chief Complaint  Patient presents with  . Medication Refill     (Consider location/radiation/quality/duration/timing/severity/associated sxs/prior Treatment) HPI Comments: Patient is a 48 year old female who presents to the ED for medication refill. Patient reports trying to make an appointment with her PCP but was unable to schedule an appointment for 2 weeks. Patient requesting ibuprofen for menstrual cramps and refill of clonidine. Patient has no other complaints at this time. No other associated symptoms.    Past Medical History  Diagnosis Date  . Hypertension   . Dysmenorrhea    Past Surgical History  Procedure Laterality Date  . Gallbladder surgery  2011  . Hand surgery  20 years ago    surgery due to infection per pt.   Family History  Problem Relation Age of Onset  . Adopted: Yes   History  Substance Use Topics  . Smoking status: Never Smoker   . Smokeless tobacco: Not on file  . Alcohol Use: No     Comment: LAST TIME-   2 MTHS AGO   OB History   Grav Para Term Preterm Abortions TAB SAB Ect Mult Living   2    2  2         Review of Systems  Constitutional: Negative for fever, chills and fatigue.  HENT: Negative for trouble swallowing.   Eyes: Negative for visual disturbance.  Respiratory: Negative for shortness of breath.   Cardiovascular: Negative for chest pain and palpitations.  Gastrointestinal: Negative for nausea, vomiting, abdominal pain and diarrhea.  Genitourinary: Negative for dysuria and difficulty urinating.  Musculoskeletal: Negative for arthralgias and neck pain.  Skin: Negative for color change.  Neurological: Negative for dizziness and weakness.  Psychiatric/Behavioral: Negative for dysphoric mood.      Allergies  Ciprofloxacin  Home Medications   Prior to Admission medications   Medication Sig Start Date End  Date Taking? Authorizing Provider  cloNIDine (CATAPRES) 0.2 MG tablet Take 0.2 mg by mouth 4 (four) times daily.   Yes Historical Provider, MD   BP 118/87  Pulse 96  Temp(Src) 98.7 F (37.1 C)  Resp 18  Wt 120 lb (54.432 kg)  SpO2 100%  LMP 07/12/2014 Physical Exam  Nursing note and vitals reviewed. Constitutional: She appears well-developed and well-nourished. No distress.  HENT:  Head: Normocephalic and atraumatic.  Eyes: Conjunctivae are normal.  Cardiovascular: Normal rate and regular rhythm.  Exam reveals no gallop and no friction rub.   No murmur heard. Pulmonary/Chest: Effort normal and breath sounds normal. She has no wheezes. She has no rales. She exhibits no tenderness.  Abdominal: Soft. There is no tenderness.  Musculoskeletal: Normal range of motion.  Neurological: She is alert.  Speech is goal-oriented. Moves limbs without ataxia.   Skin: Skin is warm and dry.    ED Course  Procedures (including critical care time) Labs Review Labs Reviewed - No data to display  Imaging Review No results found.   EKG Interpretation None      MDM   Final diagnoses:  Encounter for medication refill    11:26 AM Patient will have medication refill here because she is unable to see her PCP for 2 weeks. Vitals stable and patient afebrile. No further evaluation needed at this time.    Alvina Chou, PA-C 07/12/14 1131

## 2014-07-12 NOTE — ED Notes (Signed)
Referred to ED by Dr Rip Harbour for medication refill of Motrin and Clonidine

## 2014-07-12 NOTE — Progress Notes (Signed)
Regina Estes,   Provided pt with a list of self-pay providers and a [place] [employer] application to help patient establish primary care.

## 2014-07-15 NOTE — ED Provider Notes (Signed)
Medical screening examination/treatment/procedure(s) were performed by non-physician practitioner and as supervising physician I was immediately available for consultation/collaboration.  Richarda Blade, MD 07/15/14 1037

## 2014-07-17 ENCOUNTER — Emergency Department (HOSPITAL_COMMUNITY)
Admission: EM | Admit: 2014-07-17 | Discharge: 2014-07-17 | Disposition: A | Discharge status: Home / Self Care | Payer: No Typology Code available for payment source | Attending: Emergency Medicine | Admitting: Emergency Medicine

## 2014-07-17 ENCOUNTER — Encounter (HOSPITAL_COMMUNITY): Payer: Self-pay | Admitting: Emergency Medicine

## 2014-07-17 DIAGNOSIS — I10 Essential (primary) hypertension: Secondary | ICD-10-CM | POA: Insufficient documentation

## 2014-07-17 DIAGNOSIS — Z79899 Other long term (current) drug therapy: Secondary | ICD-10-CM | POA: Insufficient documentation

## 2014-07-17 DIAGNOSIS — Z760 Encounter for issue of repeat prescription: Secondary | ICD-10-CM | POA: Insufficient documentation

## 2014-07-17 DIAGNOSIS — R109 Unspecified abdominal pain: Secondary | ICD-10-CM | POA: Insufficient documentation

## 2014-07-17 DIAGNOSIS — Z791 Long term (current) use of non-steroidal anti-inflammatories (NSAID): Secondary | ICD-10-CM | POA: Insufficient documentation

## 2014-07-17 DIAGNOSIS — N946 Dysmenorrhea, unspecified: Secondary | ICD-10-CM | POA: Insufficient documentation

## 2014-07-17 MED ORDER — NAPROXEN 500 MG PO TABS: 500.0000 mg | ORAL_TABLET | Freq: Two times a day (BID) | ORAL | Status: DC

## 2014-07-17 MED ORDER — TRAMADOL HCL 50 MG PO TABS: 50.0000 mg | ORAL_TABLET | Freq: Four times a day (QID) | ORAL | Status: DC | PRN

## 2014-07-17 NOTE — ED Provider Notes (Signed)
CSN: [id number]     Arrival date & time 07/17/14  0820 History   First MD Initiated Contact with Patient 07/17/14 [phone]     Chief Complaint  Patient presents with  . Medication Refill  . Abdominal Pain     (Consider location/radiation/quality/duration/timing/severity/associated sxs/prior Treatment) HPI Comments: Patient here requesting medications for her menstrual cramps. States that there is no risk of pregnancy. Has been using ibuprofen without relief. No fever or chills. This is similar to her prior counts. Denies any dizziness or weakness with standing. No syncope or near-syncope. Symptoms are persistent and nothing makes them better. States that tramadol has helped her in the past  Patient is a 48 y.o. female presenting with abdominal pain. The history is provided by the patient.  Abdominal Pain   Past Medical History  Diagnosis Date  . Hypertension   . Dysmenorrhea    Past Surgical History  Procedure Laterality Date  . Gallbladder surgery  2011  . Hand surgery  20 years ago    surgery due to infection per pt.   Family History  Problem Relation Age of Onset  . Adopted: Yes   History  Substance Use Topics  . Smoking status: Never Smoker   . Smokeless tobacco: Not on file  . Alcohol Use: No     Comment: LAST TIME-   2 MTHS AGO   OB History   Grav Para Term Preterm Abortions TAB SAB Ect Mult Living   2    2  2         Review of Systems  Gastrointestinal: Positive for abdominal pain.  All other systems reviewed and are negative.     Allergies  Ciprofloxacin  Home Medications   Prior to Admission medications   Medication Sig Start Date End Date Taking? Authorizing Provider  cloNIDine (CATAPRES) 0.2 MG tablet Take 0.2 mg by mouth 4 (four) times daily.   Yes Historical Provider, MD  ibuprofen (ADVIL,MOTRIN) 800 MG tablet Take 1 tablet (800 mg total) by mouth 3 (three) times daily. 07/12/14  Yes Kaitlyn Szekalski, PA-C   BP 139/94  Pulse 100  Temp(Src) 98.6  F (37 C) (Oral)  Resp 18  SpO2 100%  LMP 07/17/2014 Physical Exam  Nursing note and vitals reviewed. Constitutional: She is oriented to person, place, and time. She appears well-developed and well-nourished.  Non-toxic appearance. No distress.  HENT:  Head: Normocephalic and atraumatic.  Eyes: Conjunctivae, EOM and lids are normal. Pupils are equal, round, and reactive to light.  Neck: Normal range of motion. Neck supple. No tracheal deviation present. No mass present.  Cardiovascular: Normal rate, regular rhythm and normal heart sounds.  Exam reveals no gallop.   No murmur heard. Pulmonary/Chest: Effort normal and breath sounds normal. No stridor. No respiratory distress. She has no decreased breath sounds. She has no wheezes. She has no rhonchi. She has no rales.  Abdominal: Soft. Normal appearance and bowel sounds are normal. She exhibits no distension. There is no tenderness. There is no rigidity, no rebound, no guarding and no CVA tenderness.  Musculoskeletal: Normal range of motion. She exhibits no edema and no tenderness.  Neurological: She is alert and oriented to person, place, and time. She has normal strength. No cranial nerve deficit or sensory deficit. GCS eye subscore is 4. GCS verbal subscore is 5. GCS motor subscore is 6.  Skin: Skin is warm and dry. No abrasion and no rash noted.  Psychiatric: She has a normal mood and affect. Her speech is  normal and behavior is normal.    ED Course  Procedures (including critical care time) Labs Review Labs Reviewed - No data to display  Imaging Review No results found.   EKG Interpretation None      MDM   Final diagnoses:  None    Will give patient prescription for Ultram as well as Naprosyn. Patient counseled to not take excessive doses of ibuprofen    Leota Jacobsen, MD 07/17/14 [phone]

## 2014-07-17 NOTE — Discharge Instructions (Signed)
Do not take ibuprofen and Naprosyn together. Take the Naprosyn only as directed. Followup with your Dr. Dysmenorrhea Menstrual cramps (dysmenorrhea) are caused by the muscles of the uterus tightening (contracting) during a menstrual period. For some women, this discomfort is merely bothersome. For others, dysmenorrhea can be severe enough to interfere with everyday activities for a few days each month. Primary dysmenorrhea is menstrual cramps that last a couple of days when you start having menstrual periods or soon after. This often begins after a teenager starts having her period. As a woman gets older or has a baby, the cramps will usually lessen or disappear. Secondary dysmenorrhea begins later in life, lasts longer, and the pain may be stronger than primary dysmenorrhea. The pain may start before the period and last a few days after the period.  CAUSES  Dysmenorrhea is usually caused by an underlying problem, such as:  The tissue lining the uterus grows outside of the uterus in other areas of the body (endometriosis).  The endometrial tissue, which normally lines the uterus, is found in or grows into the muscular walls of the uterus (adenomyosis).  The pelvic blood vessels are engorged with blood just before the menstrual period (pelvic congestive syndrome).  Overgrowth of cells (polyps) in the lining of the uterus or cervix.  Falling down of the uterus (prolapse) because of loose or stretched ligaments.  Depression.  Bladder problems, infection, or inflammation.  Problems with the intestine, a tumor, or irritable bowel syndrome.  Cancer of the female organs or bladder.  A severely tipped uterus.  A very tight opening or closed cervix.  Noncancerous tumors of the uterus (fibroids).  Pelvic inflammatory disease (PID).  Pelvic scarring (adhesions) from a previous surgery.  Ovarian cyst.  An intrauterine device (IUD) used for birth control. RISK FACTORS You may be at greater  risk of dysmenorrhea if:  You are younger than age 62.  You started puberty early.  You have irregular or heavy bleeding.  You have never given birth.  You have a family history of this problem.  You are a smoker. SIGNS AND SYMPTOMS   Cramping or throbbing pain in your lower abdomen.  Headaches.  Lower back pain.  Nausea or vomiting.  Diarrhea.  Sweating or dizziness.  Loose stools. DIAGNOSIS  A diagnosis is based on your history, symptoms, physical exam, diagnostic tests, or procedures. Diagnostic tests or procedures may include:  Blood tests.  Ultrasonography.  An examination of the lining of the uterus (dilation and curettage, D&C).  An examination inside your abdomen or pelvis with a scope (laparoscopy).  X-rays.  CT scan.  MRI.  An examination inside the bladder with a scope (cystoscopy).  An examination inside the intestine or stomach with a scope (colonoscopy, gastroscopy). TREATMENT  Treatment depends on the cause of the dysmenorrhea. Treatment may include:  Pain medicine prescribed by your health care provider.  Birth control pills or an IUD with progesterone hormone in it.  Hormone replacement therapy.  Nonsteroidal anti-inflammatory drugs (NSAIDs). These may help stop the production of prostaglandins.  Surgery to remove adhesions, endometriosis, ovarian cyst, or fibroids.  Removal of the uterus (hysterectomy).  Progesterone shots to stop the menstrual period.  Cutting the nerves on the sacrum that go to the female organs (presacral neurectomy).  Electric current to the sacral nerves (sacral nerve stimulation).  Antidepressant medicine.  Psychiatric therapy, counseling, or group therapy.  Exercise and physical therapy.  Meditation and yoga therapy.  Acupuncture. HOME CARE INSTRUCTIONS   Only  take over-the-counter or prescription medicines as directed by your health care provider.  Place a heating pad or hot water bottle on  your lower back or abdomen. Do not sleep with the heating pad.  Use aerobic exercises, walking, swimming, biking, and other exercises to help lessen the cramping.  Massage to the lower back or abdomen may help.  Stop smoking.  Avoid alcohol and caffeine. SEEK MEDICAL CARE IF:   Your pain does not get better with medicine.  You have pain with sexual intercourse.  Your pain increases and is not controlled with medicines.  You have abnormal vaginal bleeding with your period.  You develop nausea or vomiting with your period that is not controlled with medicine. SEEK IMMEDIATE MEDICAL CARE IF:  You pass out.  Document Released: 10/11/2005 Document Revised: 06/13/2013 Document Reviewed: 03/29/2013 [facility] Patient Information 2015 [place], [place]. This information is not intended to replace advice given to you by your health care provider. Make sure you discuss any questions you have with your health care provider.

## 2014-07-17 NOTE — ED Notes (Signed)
Pt requesting med refill of ibuprofen for period cramps.  States that she was given 30 ibuprofen 4 days ago and she has already taken them all.  States that she used all of them before her period even started.  Just started her period this morning.

## 2014-07-24 ENCOUNTER — Emergency Department (HOSPITAL_COMMUNITY)
Admission: EM | Admit: 2014-07-24 | Discharge: 2014-07-24 | Disposition: A | Discharge status: Home / Self Care | Payer: No Typology Code available for payment source | Attending: Emergency Medicine | Admitting: Emergency Medicine

## 2014-07-24 ENCOUNTER — Encounter (HOSPITAL_COMMUNITY): Payer: Self-pay | Admitting: Emergency Medicine

## 2014-07-24 DIAGNOSIS — Z760 Encounter for issue of repeat prescription: Secondary | ICD-10-CM | POA: Insufficient documentation

## 2014-07-24 DIAGNOSIS — Z791 Long term (current) use of non-steroidal anti-inflammatories (NSAID): Secondary | ICD-10-CM | POA: Insufficient documentation

## 2014-07-24 DIAGNOSIS — Z8742 Personal history of other diseases of the female genital tract: Secondary | ICD-10-CM | POA: Insufficient documentation

## 2014-07-24 DIAGNOSIS — R000 Tachycardia, unspecified: Secondary | ICD-10-CM | POA: Insufficient documentation

## 2014-07-24 DIAGNOSIS — I10 Essential (primary) hypertension: Secondary | ICD-10-CM | POA: Insufficient documentation

## 2014-07-24 MED ORDER — CLONIDINE HCL 0.2 MG PO TABS: 0.2000 mg | ORAL_TABLET | Freq: Three times a day (TID) | ORAL | Status: DC

## 2014-07-24 MED ORDER — CLONIDINE HCL 0.1 MG PO TABS
0.2000 mg | ORAL_TABLET | Freq: Once | ORAL | Status: AC
  Administered 2014-07-24: 0.2 mg via ORAL
  Filled 2014-07-24: qty 2

## 2014-07-24 NOTE — ED Notes (Addendum)
Pt requesting refill on her BP meds clonidine. Pt has new insurance and cant get in with a PCP and the doctor she was seeing states that she cant refill it anymore.  Pt states that she doesn't know when she will be able to find a PCP because she has to work to pay her bills. Pt states that she took her last pill yesterday.

## 2014-07-24 NOTE — ED Provider Notes (Signed)
Medical screening examination/treatment/procedure(s) were performed by non-physician practitioner and as supervising physician I was immediately available for consultation/collaboration.  Kamron Vanwyhe T Chelsia Serres, MD 07/24/14 2315 

## 2014-07-24 NOTE — ED Provider Notes (Signed)
CSN: [id number]     Arrival date & time 07/24/14  1624 History  This chart was scribed for Domenic Moras, PA with Leota Jacobsen, MD by Edison Simon, ED Scribe. This patient was seen in room WTR8/WTR8 and the patient's care was started at 5:22 PM.    Chief Complaint  Patient presents with  . Medication Refill   The history is provided by the patient. No language interpreter was used.   HPI Comments: Regina Estes is a 48 y.o. female who presents to the Emergency Department requesting Clonidine for for blood pressure, which she ran out of yesterday. She states she experiences anxiety when she runs out of Clonidine, which has happened a few times in the past. She states she has been taking Clonidine for 3 years; she states she has been on .2mg  x3 times a day; she states her PCP was going to put her on .3mg  x2 times a day after her current prescription ran out. However, she reports recently changing jobs and insurance and needs to find a new PCP. She denies chest pain, abdominal pain, or appetite change.  Past Medical History  Diagnosis Date  . Hypertension   . Dysmenorrhea    Past Surgical History  Procedure Laterality Date  . Gallbladder surgery  2011  . Hand surgery  20 years ago    surgery due to infection per pt.   Family History  Problem Relation Age of Onset  . Adopted: Yes   History  Substance Use Topics  . Smoking status: Never Smoker   . Smokeless tobacco: Not on file  . Alcohol Use: No     Comment: LAST TIME-   2 MTHS AGO   OB History   Grav Para Term Preterm Abortions TAB SAB Ect Mult Living   2    2  2         Review of Systems  Constitutional: Negative for appetite change.  Cardiovascular: Negative for chest pain.  Gastrointestinal: Negative for abdominal pain.      Allergies  Ciprofloxacin  Home Medications   Prior to Admission medications   Medication Sig Start Date End Date Taking? Authorizing Provider  cloNIDine (CATAPRES) 0.2 MG tablet Take 0.2 mg by  mouth 4 (four) times daily.    Historical Provider, MD  naproxen (NAPROSYN) 500 MG tablet Take 1 tablet (500 mg total) by mouth 2 (two) times daily. 07/17/14   Leota Jacobsen, MD  traMADol (ULTRAM) 50 MG tablet Take 1 tablet (50 mg total) by mouth every 6 (six) hours as needed. 07/17/14   Leota Jacobsen, MD   BP 116/81  Pulse 106  Temp(Src) 97.9 F (36.6 C) (Oral)  Resp 18  SpO2 100%  LMP 07/17/2014 Physical Exam  Nursing note and vitals reviewed. Constitutional: She is oriented to person, place, and time. She appears well-developed and well-nourished.  HENT:  Head: Normocephalic and atraumatic.  Eyes: Conjunctivae are normal.  Neck: Normal range of motion. Neck supple.  Cardiovascular: Regular rhythm and normal heart sounds.  Exam reveals no gallop and no friction rub.   No murmur heard. tachycardic  Pulmonary/Chest: Effort normal and breath sounds normal. No respiratory distress. She has no wheezes. She has no rales.  Musculoskeletal: Normal range of motion.  Neurological: She is alert and oriented to person, place, and time.  Skin: Skin is warm and dry.  Psychiatric: She has a normal mood and affect.    ED Course  Procedures (including critical care time) Labs Review Labs  Reviewed - No data to display  Imaging Review No results found.   EKG Interpretation None     DIAGNOSTIC STUDIES: Oxygen Saturation is 100% on room air, normal by my interpretation.    COORDINATION OF CARE: 5:27 PM Discussed with patient that I do not feel comfortable changing her prescription and so will put her on .2mg  Clonidine x3 times a day. I will also provide her with resources to find a new PCP.  MDM   Final diagnoses:  Encounter for medication refill    BP 116/81  Pulse 106  Temp(Src) 97.9 F (36.6 C) (Oral)  Resp 18  SpO2 100%  LMP 07/17/2014   I personally performed the services described in this documentation, which was scribed in my presence. The recorded information has  been reviewed and is accurate.     Domenic Moras, PA-C 07/24/14 1732

## 2014-07-24 NOTE — Discharge Instructions (Signed)
Medication Refill, Emergency Department °We have refilled your medication today as a courtesy to you. It is best for your medical care, however, to take care of getting refills done through your primary caregiver's office. They have your records and can do a better job of follow-up than we can in the emergency department. °On maintenance medications, we often only prescribe enough medications to get you by until you are able to see your regular caregiver. This is a more expensive way to refill medications. °In the future, please plan for refills so that you will not have to use the emergency department for this. °Thank you for your help. Your help allows us to better take care of the daily emergencies that enter our department. °Document Released: 01/28/2004 Document Revised: 01/03/2012 Document Reviewed: 01/18/2014 °ExitCare® Patient Information ©2015 [employer]. This information is not intended to replace advice given to you by your health care provider. Make sure you discuss any questions you have with your health care provider. ° ° °Emergency Department Resource Guide °1) Find a Doctor and Pay Out of Pocket °Although you won't have to find out who is covered by your insurance plan, it is a good idea to ask around and get recommendations. You will then need to call the office and see if the doctor you have chosen will accept you as a new patient and what types of options they offer for patients who are self-pay. Some doctors offer discounts or will set up payment plans for their patients who do not have insurance, but you will need to ask so you aren't surprised when you get to your appointment. ° °2) Contact Your Local Health Department °Not all health departments have doctors that can see patients for sick visits, but many do, so it is worth a call to see if yours does. If you don't know where your local health department is, you can check in your phone book. The CDC also has a tool to help you locate your  state's health department, and many state websites also have listings of all of their local health departments. ° °3) Find a Walk-in Clinic °If your illness is not likely to be very severe or complicated, you may want to try a walk in clinic. These are popping up all over the country in pharmacies, drugstores, and shopping centers. They're usually staffed by nurse practitioners or physician assistants that have been trained to treat common illnesses and complaints. They're usually fairly quick and inexpensive. However, if you have serious medical issues or chronic medical problems, these are probably not your best option. ° °No Primary Care Doctor: °- Call [facility] at  832-8000 - they can help you locate a primary care doctor that  accepts your insurance, provides certain services, etc. °- Physician Referral Service- 1-800-533-3463 ° °Chronic Pain Problems: °Organization         Address  Phone   Notes  °[facility]  (336) 297-2271 Patients need to be referred by their primary care doctor.  ° °Medication Assistance: °Organization         Address  Phone   Notes  °[place] [facility] [address] °[place], [place] [address] (336) 641-8030 --Must be a resident of [place] °-- Must have NO insurance coverage whatsoever (no Medicaid/ Medicare, etc.) °-- The pt. MUST have a primary care doctor that directs their care regularly and follows them in the community °  °[facility]  (866) 331-1348   °[employer]  (888) 892-1162   ° °Agencies that provide inexpensive medical care: °Organization         Address  Phone   Notes  °[facility]  (336) 832-8035   °[facility]    (336) 832-7272   °[facility] [address] °[place], [place] [address] (336) 832-4777   °[facility] [address], °[place] (336) 271-4999   °[facility]    (336) 373-0678   °[facility]    (336)  272-1050   °[facility] ° [address], [place] Phone:  (336) 832-4444, Fax:  (336) 832-4440 Hours of Operation:  9 am - 6 pm, M-F.  Also accepts Medicaid/Medicare and self-pay.  °[facility] ° [address], [place] Phone: (336) 832-3150, Fax: (336) 832-3151. Hours of Operation:  8:30 am - 5:30 pm, M-F.  Also accepts Medicaid and self-pay.  °[facility] [place] [address], [place] Phone: (336) 878-6027   °[facility] [address], [place], [place] (336)723-1848, Ext. 123 Mondays & Thursdays: 7-9 AM.  First 15 patients are seen on a first come, first serve basis. °  ° °Medicaid-accepting [place] Providers: ° °Organization         Address  Phone   Notes  °[facility] [address], [place] (336) 641-2100 Also accepts self-pay patients.  °[facility] [address], [place] ° (336) 856-9996   °[facility] [address], [place] (336) 288-8857   °[facility] [address] [place] [address], [place] (336) 299-7000   °Veita Bland [address], [place]  ° (336) 373-1557 Only accepts [place] Access Medicaid patients after they have their name applied to their card.  ° °Self-Pay (no insurance) in [place]: ° °Organization         Address  Phone   Notes  °Sickle Cell Patients, [facility] [address], [place] (336) 832-1970   °[facility] [address], [place] (336) 832-4400   °[facility] ° [address], [place] (336) 992-4800   °[facility]/Dr. Osei-Bonsu ° [address] [place] [address], [place] or [address], [place] (336) 841-8500 Phone number for both [place] and [place] locations is the same.  °[facility] [address], [place] (336)  299-0000   °[facility] [address] [place] [address], [place] or [address] (336) 852-7530 °(336) 878-2260   °[facility] [address], [place] (336) 350-1642, phone; (336) 294-5005, fax Sees patients 1st and 3rd Saturday of every month.  Must not qualify for public or private insurance (i.e. Medicaid, Medicare, [place] Health Choice, Veterans' Benefits) • Household income should be no more than 200% of the poverty level •The clinic cannot treat you if you are pregnant or think you are pregnant • Sexually transmitted diseases are not treated at the clinic.  ° ° °Dental Care: °Organization         Address  Phone  Notes  °[facility] [address], [place] (336) 641-6152 Accepts children up to age 21 who are enrolled in Medicaid or  Health Choice; pregnant women with a Medicaid card; and   children who have applied for Medicaid or [place] Health Choice, but were declined, whose parents can pay a reduced fee at time of service.  °[facility] [place]  [address], [place] (336) 641-7733 Accepts children up to age 21 who are enrolled in Medicaid or [place] Health Choice; pregnant women with a Medicaid card; and children who have applied for Medicaid or [place] Health Choice, but were declined, whose parents can pay a reduced fee at time of service.  °[facility] ° [address], [place] (336) 641-4533 Patients are seen by appointment only. Walk-ins are not accepted. [facility] will see patients 18 years of age and older. °Monday - Tuesday (8am-5pm) °Most Wednesdays (8:30-5pm) °$30 per visit, cash only  °[facility] ° [address], [place] (336) 641-4533 Patients are seen by appointment only. Walk-ins are not accepted. [facility] will see patients 18 years of age and older. °One Wednesday Evening (Monthly: Volunteer  Based).  $30 per visit, cash only  °[facility]  (919) 537-3737 for adults; Children under age 4, call Graduate Pediatric Dentistry at (919) [phone]. Children aged 4-14, please call (919) [phone] to request a pediatric application. ° Dental services are provided in all areas of dental care including fillings, crowns and bridges, complete and partial dentures, implants, gum treatment, root canals, and extractions. Preventive care is also provided. Treatment is provided to both adults and children. °Patients are selected via a lottery and there is often a waiting list. °  °[facility] [address], °[place] ° (336) 763-8833 [email or web address] °  °[facility] [address], [place], [place] (336)723-1848, Ext. 123 Second and Fourth Thursday of each month, opens at 6:30 AM; Clinic ends at 9 AM.  Patients are seen on a first-come first-served basis, and a limited number are seen during each clinic.  ° °[facility] ° [address], [place], [place] (336) 723-7904   Eligibility Requirements °You must have lived in [place], [place], or [place] counties for at least the last three months. °  You cannot be eligible for state or federal sponsored healthcare insurance, including Veterans Administration, Medicaid, or Medicare. °  You generally cannot be eligible for healthcare insurance through your employer.  °  How to apply: °Eligibility screenings are held every Tuesday and Wednesday afternoon from 1:00 pm until 4:00 pm. You do not need an appointment for the interview!  °[facility] [address], [place], [place] 336-631-2330   °[facility]  336-342-8273   °[facility]  336-703-3100   °[facility]  336-570-6415   ° °Behavioral Health Resources in the Community: °Intensive Outpatient Programs °Organization         Address  Phone  Notes  °[facility] [address], [place], [place] 336-878-6098   °[facility] [address], [place], [place] 336-832-9800   °[facility] [address], [place], [place] ° 336-882-2125   °[facility] [address],  °[place], [place] 1-800-853-5163 or 336-641-4981   °Substance Abuse Resources °Organization         Address  Phone  Notes  °[facility]  336-882-2125   °[facility]  336-784-9470   °[facility]  336-285-9073   °[facility]  336-845-3988   °Residential &   Outpatient Substance Abuse Program  1-800-659-3381   °Psychological Services °Organization         Address  Phone  Notes  °[facility]  336- 832-9600   °[facility]  336- 378-7881   °[facility] [address], [place] 1-800-853-5163 or 336-641-4981   ° °Mobile Crisis Teams °Organization         Address  Phone  Notes  °[facility], Mobile Crisis Care Unit  1-877-626-1772   °Assertive °[facility] ° [address] [place], [place] 336-834-9664   °Sharon DeEsch [address] °[place] 336-554-5454   ° °Self-Help/Support Groups °Organization         Address  Phone             Notes  °[facility] - variety of support groups  336- 373-1402 Call for more information  °Narcotics Anonymous (NA), [facility] [address], °[place]  2 meetings at this location  ° °Residential Treatment Programs °Organization         Address  Phone  Notes  °[facility] [address],    °[place]  1-866-801-8205   °[facility] ° [address], [place], [place] 704-293-8524   °[facility] [address], [place] 336-845-3988 Admissions: 8am-3pm M-F  °[facility] [address]    °[place], [place] 336-841-1104   °[facility] [address], [place], [place] 336-379-7146    °[facility] [address]  °[place], [place] 336-285-9073   °[facility] - Intensive Outpatient [address], [place], [place] 336-852-3033   °[facility] [address]  °[place], [place] 1-877-615-2722 or 336-784-9470   °[facility] [address] [place], [place] 336-227-7417 Accepts Medicaid  °[facility] [address]  °[place] 1-800-659-3381 Substance Abuse/Addiction Treatment  ° °[place] Behavioral Health Resources °Organization         Address  Phone  Notes  °[facility]  (888) 581-9988   °Julie Brannon, PhD [address] [place], [place]   (336) 349-5553 or (336) 951-0000   °[facility]   [address] °[place], [place] (336) 349-4454   °[facility] [address], [place], [place] (336) 342-8316 Insurance/Medicaid/sponsorship through [facility]  °[facility] [address]                                    [place], [place] (336) 342-8316 Therapy/tele-psych/case  °[facility] [address]  ° [place], [place] (336) 349-2233    °Dr. Arfeen  (336) 349-4544   °[facility] [place]  [employer] [place] [facility] 1) [address], [place] °2) [address], [place] °3)  [address], [place] (336) 349-3220 °(336) 342-7768 ° °(336) 342-8140   °[place] [facility] (336) 342-1394 or (336) 342-3537 (After Hours)    ° ° ° °

## 2014-07-26 ENCOUNTER — Emergency Department (HOSPITAL_COMMUNITY)
Admission: EM | Admit: 2014-07-26 | Discharge: 2014-07-26 | Disposition: A | Discharge status: Home / Self Care | Payer: No Typology Code available for payment source | Attending: Emergency Medicine | Admitting: Emergency Medicine

## 2014-07-26 ENCOUNTER — Encounter (HOSPITAL_COMMUNITY): Payer: Self-pay | Admitting: Emergency Medicine

## 2014-07-26 DIAGNOSIS — I10 Essential (primary) hypertension: Secondary | ICD-10-CM | POA: Insufficient documentation

## 2014-07-26 DIAGNOSIS — Z79899 Other long term (current) drug therapy: Secondary | ICD-10-CM | POA: Insufficient documentation

## 2014-07-26 DIAGNOSIS — Z791 Long term (current) use of non-steroidal anti-inflammatories (NSAID): Secondary | ICD-10-CM | POA: Insufficient documentation

## 2014-07-26 DIAGNOSIS — R112 Nausea with vomiting, unspecified: Secondary | ICD-10-CM | POA: Insufficient documentation

## 2014-07-26 DIAGNOSIS — R000 Tachycardia, unspecified: Secondary | ICD-10-CM | POA: Insufficient documentation

## 2014-07-26 DIAGNOSIS — Z8742 Personal history of other diseases of the female genital tract: Secondary | ICD-10-CM | POA: Insufficient documentation

## 2014-07-26 MED ORDER — ONDANSETRON 4 MG PO TBDP
4.0000 mg | ORAL_TABLET | Freq: Once | ORAL | Status: DC
  Filled 2014-07-26: qty 1

## 2014-07-26 MED ORDER — CLONIDINE HCL 0.1 MG PO TABS
0.2000 mg | ORAL_TABLET | Freq: Once | ORAL | Status: AC
  Administered 2014-07-26: 0.2 mg via ORAL
  Filled 2014-07-26: qty 2

## 2014-07-26 MED ORDER — CLONIDINE HCL 0.1 MG PO TABS: 0.1000 mg | ORAL_TABLET | Freq: Once | ORAL | Status: DC

## 2014-07-26 NOTE — Discharge Instructions (Signed)
If you were given medicines take as directed.  If you are on coumadin or contraceptives realize their levels and effectiveness is altered by many different medicines.  If you have any reaction (rash, tongues swelling, other) to the medicines stop taking and see a physician.   Please follow up as directed and return to the ER or see a physician for new or worsening symptoms.  Thank you. Filed Vitals:   07/26/14 0716  BP: 138/73  Pulse: 106  Temp: 98.7 F (37.1 C)  TempSrc: Oral  Resp: 18  SpO2: 100%   Please follow with primary doctor for blood pressure management.  Return for chest pain, shortness of breath, new headaches, stroke like symptoms, other.

## 2014-07-26 NOTE — ED Notes (Signed)
After obtaining VS, patient put jacket on and walked out of ED Patient did not want to stay for DC papers Will make EDP aware Patient alert and oriented x 4 and in NAD upon leaving ED

## 2014-07-26 NOTE — ED Provider Notes (Signed)
CSN: [id number]     Arrival date & time 07/26/14  0704 History   First MD Initiated Contact with Patient 07/26/14 [phone]     Chief Complaint  Patient presents with  . Hypertension  . Emesis     (Consider location/radiation/quality/duration/timing/severity/associated sxs/prior Treatment) HPI Comments: 48 year female with history of high blood pressure, cholecystectomy, nonsmoker, no alcohol use presents with high blood pressure, dizziness and vomiting. Patient has been out of her clonidine for the last 12 hours and feels this is how she feels every time she runs out. Patient was given refill recently in ER however has not filled the prescription. Patient is currently looking for primary physician with a new job she started. Patient denies chest pain, shortness of breath, headache, neurologic symptoms. She vomited one episode of nonbloody with mild nausea. No cardiac history or other cardiac risk factors.  Patient is a 48 y.o. female presenting with hypertension and vomiting. The history is provided by the patient.  Hypertension Pertinent negatives include no chest pain, no abdominal pain, no headaches and no shortness of breath.  Emesis Associated symptoms: no abdominal pain, no chills and no headaches     Past Medical History  Diagnosis Date  . Hypertension   . Dysmenorrhea    Past Surgical History  Procedure Laterality Date  . Gallbladder surgery  2011  . Hand surgery  20 years ago    surgery due to infection per pt.   Family History  Problem Relation Age of Onset  . Adopted: Yes   History  Substance Use Topics  . Smoking status: Never Smoker   . Smokeless tobacco: Not on file  . Alcohol Use: No     Comment: LAST TIME-   2 MTHS AGO   OB History   Grav Para Term Preterm Abortions TAB SAB Ect Mult Living   2    2  2         Review of Systems  Constitutional: Positive for appetite change. Negative for fever and chills.  HENT: Negative for congestion.   Eyes: Negative for  visual disturbance.  Respiratory: Negative for shortness of breath.   Cardiovascular: Negative for chest pain.  Gastrointestinal: Positive for nausea and vomiting. Negative for abdominal pain.  Genitourinary: Negative for dysuria and flank pain.  Musculoskeletal: Negative for back pain, neck pain and neck stiffness.  Skin: Negative for rash.  Neurological: Positive for dizziness. Negative for light-headedness and headaches.      Allergies  Ciprofloxacin  Home Medications   Prior to Admission medications   Medication Sig Start Date End Date Taking? Authorizing Provider  cloNIDine (CATAPRES) 0.3 MG tablet Take 0.3 mg by mouth 2 (two) times daily.   Yes Historical Provider, MD  naproxen (NAPROSYN) 500 MG tablet Take 1 tablet (500 mg total) by mouth 2 (two) times daily. 07/17/14  Yes Leota Jacobsen, MD  traMADol (ULTRAM) 50 MG tablet Take 1 tablet (50 mg total) by mouth every 6 (six) hours as needed. 07/17/14  Yes Leota Jacobsen, MD  cloNIDine (CATAPRES) 0.2 MG tablet Take 1 tablet (0.2 mg total) by mouth 3 (three) times daily. 07/24/14   Domenic Moras, PA-C   BP 138/73  Pulse 106  Temp(Src) 98.7 F (37.1 C) (Oral)  Resp 18  SpO2 100%  LMP 07/17/2014 Physical Exam  Nursing note and vitals reviewed. Constitutional: She is oriented to person, place, and time. She appears well-developed and well-nourished.  HENT:  Head: Normocephalic and atraumatic.  Eyes: Conjunctivae are normal.  Right eye exhibits no discharge. Left eye exhibits no discharge.  Neck: Normal range of motion. Neck supple. No tracheal deviation present.  Cardiovascular: Regular rhythm.  Tachycardia present.   Pulmonary/Chest: Effort normal and breath sounds normal.  Abdominal: Soft. She exhibits no distension. There is no tenderness. There is no guarding.  Musculoskeletal: She exhibits no edema.  Neurological: She is alert and oriented to person, place, and time. She has normal strength. No cranial nerve deficit. GCS eye  subscore is 4. GCS verbal subscore is 5. GCS motor subscore is 6.  And coordination intact, no nystagmus, extraocular muscle function intact, patient walked in without difficulty.  Skin: Skin is warm. No rash noted.  Psychiatric: She has a normal mood and affect.    ED Course  Procedures (including critical care time) [facility], ED    Imaging Review No results found.   EKG Interpretation None     EKG reviewed heart rate 97, normal axis, no acute ST elevation, sinus, normal QT, no acute findings. MDM   Final diagnoses:  Essential hypertension  Non-intractable vomiting with nausea, vomiting of unspecified type   Well-appearing patient with no clinical findings of endorgan damage. Blood pressure mild elevated in ER and this feels identical to multiple previous episodes to when she ran out of clonidine. He was nausea, vomiting gender stains are nonspecific symptoms, plan for clonidine, EKG and troponin that she's had these symptoms for 12 hours.  Discussed importance of primary care provider for medication management.  Patient declined any blood work at this time, patient has had this in the past and is not wished to be stuck for blood. Patient understands that there are other causes of nausea vomiting and high blood pressure. Patient agreed to have EKG done which was reviewed and no acute findings. Patient understands EKGs with one part of the workup. Patient requesting to go to work and she says she will be back for worsening symptoms or new symptoms. Patient has capacity make decisions.  Results and differential diagnosis were discussed with the patient/parent/guardian. Close follow up outpatient was discussed, comfortable with the plan.   Medications  ondansetron (ZOFRAN-ODT) disintegrating tablet 4 mg (4 mg Oral Not Given 07/26/14 0736)  cloNIDine (CATAPRES) tablet 0.2 mg (0.2 mg Oral Given 07/26/14 0736)    Filed Vitals:   07/26/14 0716  07/26/14 0808  BP: 138/73 139/86  Pulse: 106 72  Temp: 98.7 F (37.1 C)   TempSrc: Oral   Resp: 18 20  SpO2: 100% 100%    Final diagnoses:  Essential hypertension  Non-intractable vomiting with nausea, vomiting of unspecified type          Mariea Clonts, MD 07/26/14 [phone]

## 2014-07-26 NOTE — ED Notes (Signed)
Per pt, was seen by Dr. Zenia Resides 2 days ago.  Pt states mg was 2 and she should be on 3 mg.  She has also not been able to fill the meds.  Wants med today until she can get her meds.  Has not taken meds x 2 days.  Pt states she thought it was high b/c she has vomited x 1.

## 2014-07-26 NOTE — ED Notes (Signed)
Patient asking for DC papers EDP at bedside

## 2014-07-26 NOTE — ED Notes (Signed)
MD at bedside. 

## 2014-07-26 NOTE — ED Notes (Signed)
Patient refusing blood work--states that she just wanted "my high blood pressure medicine" Patient refusing nausea medication Will make EDP aware

## 2014-08-05 ENCOUNTER — Encounter (HOSPITAL_COMMUNITY): Payer: Self-pay | Admitting: Emergency Medicine

## 2014-08-05 ENCOUNTER — Emergency Department (HOSPITAL_COMMUNITY)
Admission: EM | Admit: 2014-08-05 | Discharge: 2014-08-05 | Disposition: A | Discharge status: Home / Self Care | Payer: No Typology Code available for payment source | Attending: Emergency Medicine | Admitting: Emergency Medicine

## 2014-08-05 DIAGNOSIS — N946 Dysmenorrhea, unspecified: Secondary | ICD-10-CM | POA: Insufficient documentation

## 2014-08-05 DIAGNOSIS — Z79899 Other long term (current) drug therapy: Secondary | ICD-10-CM | POA: Insufficient documentation

## 2014-08-05 DIAGNOSIS — I10 Essential (primary) hypertension: Secondary | ICD-10-CM | POA: Insufficient documentation

## 2014-08-05 MED ORDER — NAPROXEN 500 MG PO TABS: 500.0000 mg | ORAL_TABLET | Freq: Two times a day (BID) | ORAL | Status: DC | PRN

## 2014-08-05 MED ORDER — TRAMADOL HCL 50 MG PO TABS: 50.0000 mg | ORAL_TABLET | Freq: Four times a day (QID) | ORAL | Status: DC | PRN

## 2014-08-05 MED ORDER — CLONIDINE HCL 0.2 MG PO TABS: 0.2000 mg | ORAL_TABLET | Freq: Four times a day (QID) | ORAL | Status: DC

## 2014-08-05 NOTE — ED Provider Notes (Signed)
CSN: [id number]     Arrival date & time 08/05/14  4098 History   First MD Initiated Contact with Patient 08/05/14 1007     Chief Complaint  Patient presents with  . Medication Refill     (Consider location/radiation/quality/duration/timing/severity/associated sxs/prior Treatment) HPI Comments: Regina Estes is a 48 y.o. female with a PMHx of HTN and dysmenorrhea who presents to the ED with complaints of high BP after running out of her BP meds as well as ongoing abd cramping with menses for which she's requesting non-narcotic medications. Pt states she takes Clonidine 0.2mg  QID for her BP, with no issues, and is currently in between PCP providers therefore has run out of her meds yesterday and is unable to get a refill from her PCP. States she recently changed insurances which caused her to have to switch PCP providers. Denies any concerns or issues related to her BP, including CP, SOB, cough, hemoptysis, HA, vision changes, urinary symptoms, or paresthesias. Additionally, she's had ongoing issues with dysmenorrhea, states her abd cramps before her menses are so painful she can't function without taking ibuprofen 800mg  or naprosyn 500mg , but that these resolve the pain. She's not experiencing the pain at the moment. States she's getting a partial hysterectomy soon, but needs an OBGYN. Denies fevers, chills, abd pain, n/v/d/c, urinary symptoms, menorrhagia, metrorrhagia, vaginal discharge, or other symptoms at this time. No complaints at the moment.   Patient is a 48 y.o. female presenting with cramps. The history is provided by the patient. No language interpreter was used.  Abdominal Cramping Pain location: lower abd. Pain quality: cramping   Pain radiates to:  Does not radiate Pain severity:  Mild Timing:  Intermittent Progression:  Resolved Chronicity:  Chronic (occurs with menses) Context comment:  Menses Relieved by:  NSAIDs Worsened by:  Nothing tried Ineffective treatments:  None  tried Associated symptoms: no anorexia, no chest pain, no constipation, no cough, no diarrhea, no dysuria, no fever, no flatus, no hematemesis, no hematochezia, no hematuria, no melena, no nausea, no shortness of breath, no vaginal bleeding, no vaginal discharge and no vomiting     Past Medical History  Diagnosis Date  . Hypertension   . Dysmenorrhea    Past Surgical History  Procedure Laterality Date  . Gallbladder surgery  2011  . Hand surgery  20 years ago    surgery due to infection per pt.   Family History  Problem Relation Age of Onset  . Adopted: Yes   History  Substance Use Topics  . Smoking status: Never Smoker   . Smokeless tobacco: Not on file  . Alcohol Use: No     Comment: LAST TIME-   2 MTHS AGO   OB History   Grav Para Term Preterm Abortions TAB SAB Ect Mult Living   2    2  2         Review of Systems  Constitutional: Negative for fever.  HENT: Negative for rhinorrhea and sinus pressure.   Eyes: Negative for photophobia and visual disturbance.  Respiratory: Negative for cough, chest tightness, shortness of breath and wheezing.   Cardiovascular: Negative for chest pain, palpitations and leg swelling.  Gastrointestinal: Negative for nausea, vomiting, abdominal pain, diarrhea, constipation, melena, hematochezia, abdominal distention, anorexia, flatus and hematemesis.  Genitourinary: Positive for menstrual problem (cramps). Negative for dysuria, urgency, hematuria, flank pain, vaginal bleeding, vaginal discharge and pelvic pain.  Musculoskeletal: Negative for arthralgias and myalgias.  Skin: Negative for rash.  Neurological: Negative for dizziness, syncope, facial  asymmetry, weakness, light-headedness, numbness and headaches.    10 Systems reviewed and are negative for acute change except as noted in the HPI.   Allergies  Ciprofloxacin  Home Medications   Prior to Admission medications   Medication Sig Start Date End Date Taking? Authorizing Provider   cloNIDine (CATAPRES) 0.2 MG tablet Take 1 tablet (0.2 mg total) by mouth 3 (three) times daily. 07/24/14   Domenic Moras, PA-C  cloNIDine (CATAPRES) 0.3 MG tablet Take 0.3 mg by mouth 2 (two) times daily.    Historical Provider, MD  naproxen (NAPROSYN) 500 MG tablet Take 1 tablet (500 mg total) by mouth 2 (two) times daily. 07/17/14   Leota Jacobsen, MD  traMADol (ULTRAM) 50 MG tablet Take 1 tablet (50 mg total) by mouth every 6 (six) hours as needed. 07/17/14   Leota Jacobsen, MD   BP 125/100  Pulse 92  Temp(Src) 98.1 F (36.7 C) (Oral)  Resp 16  SpO2 100%  LMP 07/17/2014 Physical Exam  Nursing note and vitals reviewed. Constitutional: She is oriented to person, place, and time. Vital signs are normal. She appears well-developed and well-nourished. No distress.  Afebrile, nontoxic, BP mildly elevated but near baseline (125/100)  HENT:  Head: Normocephalic and atraumatic.  Mouth/Throat: Oropharynx is clear and moist and mucous membranes are normal.  Eyes: Conjunctivae and EOM are normal. Right eye exhibits no discharge. Left eye exhibits no discharge.  Neck: Normal range of motion. Neck supple. No JVD present.  Cardiovascular: Normal rate, regular rhythm, normal heart sounds and intact distal pulses.  Exam reveals no gallop and no friction rub.   No murmur heard. BP 125/100  Pulmonary/Chest: Effort normal and breath sounds normal. No respiratory distress. She has no decreased breath sounds. She has no wheezes. She has no rhonchi. She has no rales.  CTAB in all lung fields  Abdominal: Soft. Normal appearance and bowel sounds are normal. She exhibits no distension. There is no tenderness. There is no rigidity, no rebound and no guarding.  Soft, NTND, +BS throughout  Musculoskeletal: Normal range of motion.  Neurological: She is alert and oriented to person, place, and time. She has normal strength. No cranial nerve deficit or sensory deficit.  CN 2-12 grossly intact, sensation and strength  at baseline  Skin: Skin is warm, dry and intact. No rash noted.  Psychiatric: She has a normal mood and affect.    ED Course  Procedures (including critical care time) Labs Review Labs Reviewed - No data to display  Imaging Review No results found.   EKG Interpretation None      MDM   Final diagnoses:  HTN (hypertension), benign  Dysmenorrhea    48y/o female with HTN and dysmenorrhea here for refills of meds. No complaints at this time, no s/sx of end organ damage, doubt need for labs or EKG. Encouraged pt to get PCP and OBGYN f/up ASAP. Refills given. I explained the diagnosis and have given explicit precautions to return to the ER including for any other new or worsening symptoms. The patient understands and accepts the medical plan as it's been dictated and I have answered their questions. Discharge instructions concerning home care and prescriptions have been given. The patient is STABLE and is discharged to home in good condition.  BP 125/100  Pulse 92  Temp(Src) 98.1 F (36.7 C) (Oral)  Resp 16  SpO2 100%  LMP 07/17/2014  Meds ordered this encounter  Medications  . cloNIDine (CATAPRES) 0.2 MG tablet  Sig: Take 1 tablet (0.2 mg total) by mouth 4 (four) times daily. 1 tab po tid x 2 days, then bid x 2 days, then once daily x 2 days    Dispense:  84 tablet    Refill:  0    Order Specific Question:  Supervising Provider    Answer:  Noemi Chapel D [[id number]]  . traMADol (ULTRAM) 50 MG tablet    Sig: Take 1 tablet (50 mg total) by mouth every 6 (six) hours as needed.    Dispense:  15 tablet    Refill:  0    Order Specific Question:  Supervising Provider    Answer:  Noemi Chapel D [[id number]]  . naproxen (NAPROSYN) 500 MG tablet    Sig: Take 1 tablet (500 mg total) by mouth 2 (two) times daily as needed for mild pain, moderate pain or headache (TAKE WITH MEALS.).    Dispense:  20 tablet    Refill:  0    Order Specific Question:  Supervising Provider    Answer:  Johnna Acosta [address] [place], PA-C 08/05/14 1054

## 2014-08-05 NOTE — ED Notes (Signed)
Per pt, states she needs a refill on her BP meds and something for cramps

## 2014-08-05 NOTE — ED Provider Notes (Signed)
Medical screening examination/treatment/procedure(s) were performed by non-physician practitioner and as supervising physician I was immediately available for consultation/collaboration.   EKG Interpretation None        Hoy Morn, MD 08/05/14 1535

## 2014-08-05 NOTE — Discharge Instructions (Signed)
Your blood pressure was okay here but your medication was refilled according to your prescription. You NEED to see your regular doctor for ongoing follow up and care. Monitor your BP at home twice daily, making sure you've been seated at rest for 5 minutes before taking it. Record this and take it to your doctor appointment. For your cramps you've been given naprosyn and tramadol, but you need to see the women's clinic for ongoing management of your menstrual issues. Return to the ER for changes or worsening symptoms.   Dysmenorrhea Dysmenorrhea is pain during a menstrual period. You will have pain in the lower belly (abdomen). The pain is caused by the tightening (contracting) of the muscles of the uterus. The pain can be minor or severe. Headache, feeling sick to your stomach (nausea), throwing up (vomiting), or low back pain may occur with this condition. HOME CARE  Only take medicine as told by your doctor.  Place a heating pad or hot water bottle on your lower back or belly. Do not sleep with a heating pad.  Exercise may help lessen the pain.  Massage the lower back or belly.  Stop smoking.  Avoid alcohol and caffeine. GET HELP IF:   Your pain does not get better with medicine.  You have pain during sex.  Your pain gets worse while taking pain medicine.  Your period bleeding is heavier than normal.  You keep feeling sick to your stomach or keep throwing up. GET HELP RIGHT AWAY IF: You pass out (faint). Document Released: 01/07/2009 Document Revised: 10/16/2013 Document Reviewed: 03/29/2013 [facility] Patient Information 2015 [place], [place]. This information is not intended to replace advice given to you by your health care provider. Make sure you discuss any questions you have with your health care provider.  Hypertension Hypertension is another name for high blood pressure. High blood pressure forces your heart to work harder to pump blood. A blood pressure reading has two  numbers, which includes a higher number over a lower number (example: 110/72). HOME CARE   Have your blood pressure rechecked by your doctor.  Only take medicine as told by your doctor. Follow the directions carefully. The medicine does not work as well if you skip doses. Skipping doses also puts you at risk for problems.  Do not smoke.  Monitor your blood pressure at home as told by your doctor. GET HELP IF:  You think you are having a reaction to the medicine you are taking.  You have repeat headaches or feel dizzy.  You have puffiness (swelling) in your ankles.  You have trouble with your vision. GET HELP RIGHT AWAY IF:   You get a very bad headache and are confused.  You feel weak, numb, or faint.  You get chest or belly (abdominal) pain.  You throw up (vomit).  You cannot breathe very well. MAKE SURE YOU:   Understand these instructions.  Will watch your condition.  Will get help right away if you are not doing well or get worse. Document Released: 03/29/2008 Document Revised: 10/16/2013 Document Reviewed: 08/03/2013 [facility] Patient Information 2015 [place], [place]. This information is not intended to replace advice given to you by your health care provider. Make sure you discuss any questions you have with your health care provider.  How to Take Your Blood Pressure HOW DO I GET A BLOOD PRESSURE MACHINE?  You can buy an electronic home blood pressure machine at your local pharmacy. Insurance will sometimes cover the cost if you have a  prescription.  Ask your doctor what type of machine is best for you. There are different machines for your arm and your wrist.  If you decide to buy a machine to check your blood pressure on your arm, first check the size of your arm so you can buy the right size cuff. To check the size of your arm:   Use a measuring tape that shows both inches and centimeters.   Wrap the measuring tape around the upper-middle part of your arm.  You may need someone to help you measure.   Write down your arm measurement in both inches and centimeters.   To measure your blood pressure correctly, it is important to have the right size cuff.   If your arm is up to 13 inches (up to 34 centimeters), get an adult cuff size.  If your arm is 13 to 17 inches (35 to 44 centimeters), get a large adult cuff size.    If your arm is 17 to 20 inches (45 to 52 centimeters), get an adult thigh cuff.  WHAT DO THE NUMBERS MEAN?   There are two numbers that make up your blood pressure. For example: 120/80.  The first number (120 in our example) is called the "systolic pressure." It is a measure of the pressure in your blood vessels when your heart is pumping blood.  The second number (80 in our example) is called the "diastolic pressure." It is a measure of the pressure in your blood vessels when your heart is resting between beats.  Your doctor will tell you what your blood pressure should be. WHAT SHOULD I DO BEFORE I CHECK MY BLOOD PRESSURE?   Try to rest or relax for at least 30 minutes before you check your blood pressure.  Do not smoke.  Do not have any drinks with caffeine, such as:  Soda.  Coffee.  Tea.  Check your blood pressure in a quiet room.  Sit down and stretch out your arm on a table. Keep your arm at about the level of your heart. Let your arm relax.  Make sure that your legs are not crossed. HOW DO I CHECK MY BLOOD PRESSURE?  Follow the directions that came with your machine.  Make sure you remove any tight-fighting clothing from your arm or wrist. Wrap the cuff around your upper arm or wrist. You should be able to fit a finger between the cuff and your arm. If you cannot fit a finger between the cuff and your arm, it is too tight and should be removed and rewrapped.  Some units require you to manually pump up the arm cuff.  Automatic units inflate the cuff when you press a button.  Cuff deflation is  automatic in both models.  After the cuff is inflated, the unit measures your blood pressure and pulse. The readings are shown on a monitor. Hold still and breathe normally while the cuff is inflated.  Getting a reading takes less than a minute.  Some models store readings in a memory. Some provide a printout of readings. If your machine does not store your readings, keep a written record.  Take readings with you to your next visit with your doctor. Document Released: 09/23/2008 Document Revised: 02/25/2014 Document Reviewed: 12/06/2013 [facility] Patient Information 2015 [place], [place]. This information is not intended to replace advice given to you by your health care provider. Make sure you discuss any questions you have with your health care provider.  Managing Your High Blood  Pressure Blood pressure is a measurement of how forceful your blood is pressing against the walls of the arteries. Arteries are muscular tubes within the circulatory system. Blood pressure does not stay the same. Blood pressure rises when you are active, excited, or nervous; and it lowers during sleep and relaxation. If the numbers measuring your blood pressure stay above normal most of the time, you are at risk for health problems. High blood pressure (hypertension) is a long-term (chronic) condition in which blood pressure is elevated. A blood pressure reading is recorded as two numbers, such as 120 over 80 (or 120/80). The first, higher number is called the systolic pressure. It is a measure of the pressure in your arteries as the heart beats. The second, lower number is called the diastolic pressure. It is a measure of the pressure in your arteries as the heart relaxes between beats.  Keeping your blood pressure in a normal range is important to your overall health and prevention of health problems, such as heart disease and stroke. When your blood pressure is uncontrolled, your heart has to work harder than normal. High  blood pressure is a very common condition in adults because blood pressure tends to rise with age. Men and women are equally likely to have hypertension but at different times in life. Before age 42, men are more likely to have hypertension. After 46 years of age, women are more likely to have it. Hypertension is especially common in African Americans. This condition often has no signs or symptoms. The cause of the condition is usually not known. Your caregiver can help you come up with a plan to keep your blood pressure in a normal, healthy range. BLOOD PRESSURE STAGES Blood pressure is classified into four stages: normal, prehypertension, stage 1, and stage 2. Your blood pressure reading will be used to determine what type of treatment, if any, is necessary. Appropriate treatment options are tied to these four stages:  Normal  Systolic pressure (mm Hg): below 120.  Diastolic pressure (mm Hg): below 80. Prehypertension  Systolic pressure (mm Hg): 120 to 139.  Diastolic pressure (mm Hg): 80 to 89. Stage1  Systolic pressure (mm Hg): 140 to 159.  Diastolic pressure (mm Hg): 90 to 99. Stage2  Systolic pressure (mm Hg): 160 or above.  Diastolic pressure (mm Hg): 100 or above. RISKS RELATED TO HIGH BLOOD PRESSURE Managing your blood pressure is an important responsibility. Uncontrolled high blood pressure can lead to:  A heart attack.  A stroke.  A weakened blood vessel (aneurysm).  Heart failure.  Kidney damage.  Eye damage.  Metabolic syndrome.  Memory and concentration problems. HOW TO MANAGE YOUR BLOOD PRESSURE Blood pressure can be managed effectively with lifestyle changes and medicines (if needed). Your caregiver will help you come up with a plan to bring your blood pressure within a normal range. Your plan should include the following: Education  Read all information provided by your caregivers about how to control blood pressure.  Educate yourself on the latest  guidelines and treatment recommendations. New research is always being done to further define the risks and treatments for high blood pressure. Lifestylechanges  Control your weight.  Avoid smoking.  Stay physically active.  Reduce the amount of salt in your diet.  Reduce stress.  Control any chronic conditions, such as high cholesterol or diabetes.  Reduce your alcohol intake. Medicines  Several medicines (antihypertensive medicines) are available, if needed, to bring blood pressure within a normal range. Communication  Review  all the medicines you take with your caregiver because there may be side effects or interactions.  Talk with your caregiver about your diet, exercise habits, and other lifestyle factors that may be contributing to high blood pressure.  See your caregiver regularly. Your caregiver can help you create and adjust your plan for managing high blood pressure. RECOMMENDATIONS FOR TREATMENT AND FOLLOW-UP  The following recommendations are based on current guidelines for managing high blood pressure in nonpregnant adults. Use these recommendations to identify the proper follow-up period or treatment option based on your blood pressure reading. You can discuss these options with your caregiver.  Systolic pressure of 482 to 707 or diastolic pressure of 80 to 89: Follow up with your caregiver as directed.  Systolic pressure of 867 to 544 or diastolic pressure of 90 to 100: Follow up with your caregiver within 2 months.  Systolic pressure above 920 or diastolic pressure above 100: Follow up with your caregiver within 1 month.  Systolic pressure above 712 or diastolic pressure above 197: Consider antihypertensive therapy; follow up with your caregiver within 1 week.  Systolic pressure above 588 or diastolic pressure above 325: Begin antihypertensive therapy; follow up with your caregiver within 1 week. Document Released: 07/05/2012 Document Reviewed:  07/05/2012 [facility] Patient Information 2015 [place]. This information is not intended to replace advice given to you by your health care provider. Make sure you discuss any questions you have with your health care provider. Emergency Department Resource Guide 1) Find a Doctor and Pay Out of Pocket Although you won't have to find out who is covered by your insurance plan, it is a good idea to ask around and get recommendations. You will then need to call the office and see if the doctor you have chosen will accept you as a new patient and what types of options they offer for patients who are self-pay. Some doctors offer discounts or will set up payment plans for their patients who do not have insurance, but you will need to ask so you aren't surprised when you get to your appointment.  2) Contact Your Local Health Department Not all health departments have doctors that can see patients for sick visits, but many do, so it is worth a call to see if yours does. If you don't know where your local health department is, you can check in your phone book. The CDC also has a tool to help you locate your state's health department, and many state websites also have listings of all of their local health departments.  3) Find a [facility] If your illness is not likely to be very severe or complicated, you may want to try a walk in clinic. These are popping up all over the country in pharmacies, drugstores, and shopping centers. They're usually staffed by nurse practitioners or physician assistants that have been trained to treat common illnesses and complaints. They're usually fairly quick and inexpensive. However, if you have serious medical issues or chronic medical problems, these are probably not your best option.  No Primary Care Doctor: - Call [facility] at  [phone] - they can help you locate a primary care doctor that  accepts your insurance, provides certain services, etc. - Physician  Referral Service- [phone]  Chronic Pain Problems: Organization         Address  Phone   Notes  [facility]  [phone] Patients need to be referred by their primary care doctor.   Medication Assistance:  Organization         Address  Phone   Notes  [facility] Medication Assistance Program [address] [place], [place] [address] [phone] --Must be a resident of [facility] -- Must have NO insurance coverage whatsoever (no Medicaid/ Medicare, etc.) -- The pt. MUST have a primary care doctor that directs their care regularly and follows them in the community   [facility]  [phone]   [employer]  [phone]    Agencies that provide inexpensive medical care: Organization         Address  Phone   Notes  [place]  [phone]   [facility]    [phone]   [facility] [place], [place] [address] [phone]   [place] [address], [place] [phone]   [facility]    [phone]   [facility]    [phone]   [place] [address], [place] Phone:  [phone], Fax:  [phone] Hours of Operation:  9 am - 6 pm, M-F.  Also accepts Medicaid/Medicare and self-pay.  [facility] for [address], [address], [place] Phone: [phone], Fax: [phone]. Hours of Operation:  8:30 am - 5:30 pm, M-F.  Also accepts Medicaid and self-pay.  [facility] [place] [address] [place] Phone: [phone]   [place], [place], [place] [phone], Ext. [phone] Mondays & Thursdays: 7-9 AM.  First 15 patients are seen on a first come, first serve basis.    Jud Providers:  Organization         Address  Phone   Notes  [facility] [address], [place] [phone] Also accepts self-pay patients.  [facility] [address], [place]  [phone]   [place], [address], [place] [phone]   [facility] [address] [place] [phone]   Lucianne Lei [address], [place]   [phone] Only accepts [place] [facility] [place] patients after they have their name applied to their card.   Self-Pay (no insurance) in [facility]:  Organization         Address  Phone   Notes  Sickle Cell Patients, [facility] Internal [facility] [place] [phone]   [facility] [place] [phone]   [facility] [place]  [place], [place], [place] [phone]   [facility]/Dr. Osei-Bonsu  [address] [place] or [address], [place] [phone] Phone number for both [place] and [place] locations is the same.  [facility] [address] [place] [phone]   [facility] [address] [place] or [address] [phone]   [facility] [address] [place] [phone], phone; [phone], fax Sees patients 1st and 3rd Saturday of every month.  Must not qualify for public or private insurance (i.e. Medicaid, Medicare,  Health Choice, Veterans' Benefits)  Household income should be no more than 200% of the poverty level The clinic cannot treat  you if you are pregnant or think you are pregnant  Sexually transmitted diseases are not treated at the clinic.

## 2014-08-10 ENCOUNTER — Encounter (HOSPITAL_COMMUNITY): Payer: Self-pay | Admitting: Emergency Medicine

## 2014-08-10 ENCOUNTER — Emergency Department (HOSPITAL_COMMUNITY)
Admission: EM | Admit: 2014-08-10 | Discharge: 2014-08-10 | Disposition: A | Discharge status: Home / Self Care | Payer: No Typology Code available for payment source | Attending: Emergency Medicine | Admitting: Emergency Medicine

## 2014-08-10 DIAGNOSIS — R102 Pelvic and perineal pain: Secondary | ICD-10-CM

## 2014-08-10 DIAGNOSIS — M549 Dorsalgia, unspecified: Secondary | ICD-10-CM | POA: Insufficient documentation

## 2014-08-10 DIAGNOSIS — R112 Nausea with vomiting, unspecified: Secondary | ICD-10-CM | POA: Insufficient documentation

## 2014-08-10 DIAGNOSIS — Z79899 Other long term (current) drug therapy: Secondary | ICD-10-CM | POA: Insufficient documentation

## 2014-08-10 DIAGNOSIS — N920 Excessive and frequent menstruation with regular cycle: Secondary | ICD-10-CM

## 2014-08-10 DIAGNOSIS — I10 Essential (primary) hypertension: Secondary | ICD-10-CM | POA: Insufficient documentation

## 2014-08-10 LAB — CBC WITH DIFFERENTIAL/PLATELET
BASOS PCT: 0 % (ref 0–1)
Basophils Absolute: 0 10*3/uL (ref 0.0–0.1)
Eosinophils Absolute: 0.3 10*3/uL (ref 0.0–0.7)
Eosinophils Relative: 4 % (ref 0–5)
HEMATOCRIT: 35.4 % — AB (ref 36.0–46.0)
HEMOGLOBIN: 12.4 g/dL (ref 12.0–15.0)
LYMPHS ABS: 1.8 10*3/uL (ref 0.7–4.0)
Lymphocytes Relative: 22 % (ref 12–46)
MCH: 30.5 pg (ref 26.0–34.0)
MCHC: 35 g/dL (ref 30.0–36.0)
MCV: 87.2 fL (ref 78.0–100.0)
MONOS PCT: 7 % (ref 3–12)
Monocytes Absolute: 0.6 10*3/uL (ref 0.1–1.0)
NEUTROS ABS: 5.4 10*3/uL (ref 1.7–7.7)
Neutrophils Relative %: 67 % (ref 43–77)
Platelets: 291 10*3/uL (ref 150–400)
RBC: 4.06 MIL/uL (ref 3.87–5.11)
RDW: 13.8 % (ref 11.5–15.5)
WBC: 8.1 10*3/uL (ref 4.0–10.5)

## 2014-08-10 LAB — URINALYSIS, ROUTINE W REFLEX MICROSCOPIC
Glucose, UA: NEGATIVE mg/dL
Ketones, ur: 15 mg/dL — AB
Nitrite: NEGATIVE
PROTEIN: NEGATIVE mg/dL
SPECIFIC GRAVITY, URINE: 1.023 (ref 1.005–1.030)
Urobilinogen, UA: 1 mg/dL (ref 0.0–1.0)
pH: 6.5 (ref 5.0–8.0)

## 2014-08-10 LAB — BASIC METABOLIC PANEL
ANION GAP: 14 (ref 5–15)
BUN: 8 mg/dL (ref 6–23)
CHLORIDE: 106 meq/L (ref 96–112)
CO2: 21 meq/L (ref 19–32)
Calcium: 9.6 mg/dL (ref 8.4–10.5)
Creatinine, Ser: 0.69 mg/dL (ref 0.50–1.10)
GFR calc Af Amer: >90 mL/min (ref >90)
GFR calc non Af Amer: >90 mL/min (ref >90)
Glucose, Bld: 111 mg/dL — ABNORMAL HIGH (ref 70–99)
Potassium: 3.9 mEq/L (ref 3.7–5.3)
Sodium: 141 mEq/L (ref 137–147)

## 2014-08-10 LAB — URINE MICROSCOPIC-ADD ON

## 2014-08-10 LAB — WET PREP, GENITAL
Trich, Wet Prep: NONE SEEN
Yeast Wet Prep HPF POC: NONE SEEN

## 2014-08-10 MED ORDER — OXYCODONE-ACETAMINOPHEN 5-325 MG PO TABS
1.0000 | ORAL_TABLET | Freq: Once | ORAL | Status: AC
  Administered 2014-08-10: 1 via ORAL
  Filled 2014-08-10: qty 1

## 2014-08-10 MED ORDER — HYDROCODONE-ACETAMINOPHEN 5-325 MG PO TABS: 1.0000 | ORAL_TABLET | Freq: Four times a day (QID) | ORAL | Status: DC | PRN

## 2014-08-10 MED ORDER — PROMETHAZINE HCL 25 MG RE SUPP: 25.0000 mg | Freq: Four times a day (QID) | RECTAL | Status: DC | PRN

## 2014-08-10 MED ORDER — METRONIDAZOLE 500 MG PO TABS: 500.0000 mg | ORAL_TABLET | Freq: Two times a day (BID) | ORAL | Status: DC

## 2014-08-10 MED ORDER — ONDANSETRON 4 MG PO TBDP
4.0000 mg | ORAL_TABLET | Freq: Once | ORAL | Status: AC
  Administered 2014-08-10: 4 mg via ORAL
  Filled 2014-08-10: qty 1

## 2014-08-10 NOTE — ED Provider Notes (Signed)
CSN: [id number]     Arrival date & time 08/10/14  1121 History   First MD Initiated Contact with Patient 08/10/14 1150     Chief Complaint  Patient presents with  . Vaginal Bleeding  . Abdominal Pain     (Consider location/radiation/quality/duration/timing/severity/associated sxs/prior Treatment) HPI Comments: Pt c/o of recurrent menorrhagia with severe pain wqhich she has been seen for in the ER with neg CT and [place] and has seen gyn but no one can find whats wrong.  Menses this month is heavier and more uncomfortable than usual.    Patient is a 48 y.o. female presenting with vaginal bleeding and abdominal pain. The history is provided by the patient.  Vaginal Bleeding Quality:  Clots, dark red and heavier than menses Severity:  Severe Onset quality:  Gradual Duration:  5 days Timing:  Constant Progression:  Worsening Chronicity:  Recurrent Menstrual history:  Regular Number of tampons used:  3/hr Possible pregnancy: no   Context: spontaneously   Relieved by:  Nothing Worsened by:  Nothing tried Ineffective treatments:  Prescription medications and ibuprofen Associated symptoms: abdominal pain, back pain and nausea   Associated symptoms: no dysuria, no fever and no vaginal discharge   Associated symptoms comment:  Vomitting starting last night Risk factors: no gynecological surgery, does not have multiple partners, no new sexual partner, no STD exposure and does not have unprotected sex   Risk factors comment:  Not sexually active for over a year. Abdominal Pain Associated symptoms: nausea, vaginal bleeding and vomiting   Associated symptoms: no diarrhea, no dysuria, no fever and no vaginal discharge     Past Medical History  Diagnosis Date  . Hypertension   . Dysmenorrhea    Past Surgical History  Procedure Laterality Date  . Gallbladder surgery  2011  . Hand surgery  20 years ago    surgery due to infection per pt.   Family History  Problem Relation Age of Onset   . Adopted: Yes   History  Substance Use Topics  . Smoking status: Never Smoker   . Smokeless tobacco: Not on file  . Alcohol Use: No     Comment: LAST TIME-   2 MTHS AGO   OB History   Grav Para Term Preterm Abortions TAB SAB Ect Mult Living   2    2  2         Review of Systems  Constitutional: Negative for fever.  Gastrointestinal: Positive for nausea, vomiting and abdominal pain. Negative for diarrhea.       Started vomitting last night and one time this morning  Genitourinary: Positive for vaginal bleeding, vaginal pain and menstrual problem. Negative for dysuria and vaginal discharge.  Musculoskeletal: Positive for back pain.  All other systems reviewed and are negative.     Allergies  Ciprofloxacin  Home Medications   Prior to Admission medications   Medication Sig Start Date End Date Taking? Authorizing Provider  cloNIDine (CATAPRES) 0.2 MG tablet Take 1 tablet (0.2 mg total) by mouth 4 (four) times daily. 1 tab po tid x 2 days, then bid x 2 days, then once daily x 2 days 08/05/14  Yes Mercedes Strupp Camprubi-Soms, PA-C  cloNIDine (CATAPRES) 0.3 MG tablet Take 0.3 mg by mouth 2 (two) times daily.   Yes Historical Provider, MD  HYDROcodone-acetaminophen (NORCO/VICODIN) 5-325 MG per tablet Take 1 tablet by mouth every 6 (six) hours as needed for moderate pain.   Yes Historical Provider, MD  naproxen (NAPROSYN) 500 MG tablet  Take 1 tablet (500 mg total) by mouth 2 (two) times daily as needed for mild pain, moderate pain or headache (TAKE WITH MEALS.). 08/05/14  Yes Mercedes Strupp Camprubi-Soms, PA-C  traMADol (ULTRAM) 50 MG tablet Take 1 tablet (50 mg total) by mouth every 6 (six) hours as needed. 08/05/14  Yes Mercedes Strupp Camprubi-Soms, PA-C   BP 163/112  Pulse 109  Temp(Src) 99 F (37.2 C) (Oral)  Resp 16  SpO2 100%  LMP 08/10/2014 Physical Exam  Nursing note and vitals reviewed. Constitutional: She is oriented to person, place, and time. She appears  well-developed and well-nourished. No distress.  HENT:  Head: Normocephalic and atraumatic.  Mouth/Throat: Oropharynx is clear and moist.  Eyes: Conjunctivae and EOM are normal. Pupils are equal, round, and reactive to light.  Neck: Normal range of motion. Neck supple.  Cardiovascular: Normal rate, regular rhythm and intact distal pulses.   No murmur heard. Pulmonary/Chest: Effort normal and breath sounds normal. No respiratory distress. She has no wheezes. She has no rales.  Abdominal: Soft. She exhibits no distension. There is tenderness in the suprapubic area. There is no rebound and no guarding.  Genitourinary: Uterus normal. There is no rash, tenderness or lesion on the right labia. There is no rash, tenderness or lesion on the left labia. Cervix exhibits no motion tenderness, no discharge and no friability. Right adnexum displays no mass, no tenderness and no fullness. Left adnexum displays no mass, no tenderness and no fullness. There is bleeding around the vagina. No tenderness around the vagina. No vaginal discharge found.  Scan amount of dark blood in vaginal vault  Musculoskeletal: Normal range of motion. She exhibits no edema and no tenderness.  Neurological: She is alert and oriented to person, place, and time.  Skin: Skin is warm and dry. No rash noted. No erythema.  Psychiatric: She has a normal mood and affect. Her behavior is normal.    ED Course  Procedures (including critical care time) Labs Review Labs Reviewed  WET PREP, GENITAL - Abnormal; Notable for the following:    Clue Cells Wet Prep HPF POC FEW (*)    WBC, Wet Prep HPF POC FEW (*)    All other components within normal limits  BASIC METABOLIC PANEL - Abnormal; Notable for the following:    Glucose, Bld 111 (*)    All other components within normal limits  CBC WITH DIFFERENTIAL - Abnormal; Notable for the following:    HCT 35.4 (*)    All other components within normal limits  URINALYSIS, ROUTINE W REFLEX  MICROSCOPIC - Abnormal; Notable for the following:    Color, Urine AMBER (*)    APPearance CLOUDY (*)    Hgb urine dipstick LARGE (*)    Bilirubin Urine SMALL (*)    Ketones, ur 15 (*)    Leukocytes, UA SMALL (*)    All other components within normal limits  URINE MICROSCOPIC-ADD ON - Abnormal; Notable for the following:    Bacteria, UA MANY (*)    All other components within normal limits    Imaging Review No results found.   EKG Interpretation None      MDM   Final diagnoses:  Menorrhagia with regular cycle  Pelvic pain in female    Patient presenting with symptoms of menorrhagia and abdominal ongoing for the last 5 days. This is a recurrent problem. She has been seen by the emergency room several times this year as well as a GYN without definitive cause for her  pain. She's had negative CT and ultrasound in the last year. She has no symptoms of PID and states she has not had sexual intercourse for over a year making STD highly unlikely. Only a scant amount of blood in the vaginal vault with normal hemoglobin and BMP. Patient states she had some vomiting starting last night however this could be do to trying to take tramadol and Vicodin for pain. Patient is not vomiting here and tolerating food. UA without evidence of obvious infection. Wet prep with bacterial vaginosis and patient is complaining of a vaginal smell. We'll start her on Flagyl and also to pain and nausea medication. Encouraged her to followup with GYN    Blanchie Dessert, MD 08/10/14 1421

## 2014-08-10 NOTE — ED Notes (Signed)
Per pt, states heavy vaginal bleeding for 5 days-abdominal pain and vomiting started yesterday

## 2014-08-10 NOTE — Discharge Instructions (Signed)
Abnormal Uterine Bleeding Abnormal uterine bleeding can affect women at various stages in life, including teenagers, women in their reproductive years, pregnant women, and women who have reached menopause. Several kinds of uterine bleeding are considered abnormal, including:  Bleeding or spotting between periods.   Bleeding after sexual intercourse.   Bleeding that is heavier or more than normal.   Periods that last longer than usual.  Bleeding after menopause.  Many cases of abnormal uterine bleeding are minor and simple to treat, while others are more serious. Any type of abnormal bleeding should be evaluated by your health care provider. Treatment will depend on the cause of the bleeding. HOME CARE INSTRUCTIONS Monitor your condition for any changes. The following actions may help to alleviate any discomfort you are experiencing:  Avoid the use of tampons and douches as directed by your health care provider.  Change your pads frequently. You should get regular pelvic exams and Pap tests. Keep all follow-up appointments for diagnostic tests as directed by your health care provider.  SEEK MEDICAL CARE IF:   Your bleeding lasts more than 1 week.   You feel dizzy at times.  SEEK IMMEDIATE MEDICAL CARE IF:   You pass out.   You are changing pads every 15 to 30 minutes.   You have abdominal pain.  You have a fever.   You become sweaty or weak.   You are passing large blood clots from the vagina.   You start to feel nauseous and vomit. MAKE SURE YOU:   Understand these instructions.  Will watch your condition.  Will get help right away if you are not doing well or get worse. Document Released: 10/11/2005 Document Revised: 10/16/2013 Document Reviewed: 05/10/2013 Exit[employer] Patient Information 2015 [employer]. This information is not intended to replace advice given to you by your health care provider. Make sure you discuss any questions you have with your  health care provider.  

## 2014-08-11 LAB — URINE CULTURE

## 2014-08-17 ENCOUNTER — Emergency Department (HOSPITAL_COMMUNITY)
Admission: EM | Admit: 2014-08-17 | Discharge: 2014-08-17 | Discharge status: Against Medical Advice | Payer: No Typology Code available for payment source | Attending: Emergency Medicine | Admitting: Emergency Medicine

## 2014-08-17 ENCOUNTER — Encounter (HOSPITAL_COMMUNITY): Payer: Self-pay | Admitting: Emergency Medicine

## 2014-08-17 DIAGNOSIS — Z760 Encounter for issue of repeat prescription: Secondary | ICD-10-CM | POA: Insufficient documentation

## 2014-08-17 DIAGNOSIS — I10 Essential (primary) hypertension: Secondary | ICD-10-CM | POA: Insufficient documentation

## 2014-08-17 DIAGNOSIS — F419 Anxiety disorder, unspecified: Secondary | ICD-10-CM | POA: Insufficient documentation

## 2014-08-17 NOTE — ED Notes (Addendum)
Patient has been taking Clonidine 0.2 mg QID for 3-4 years. Says her MD prescribed this for anxiety and not blood pressure. Patient was under impression this medication was for blood pressure. Says now she is unable to cope with anxiety without medication now that she has been used to taking it. Says she has had to recently call EMS because "I was literally sick to my stomach"-from withdrawal from medication. Would rather take 0.3 mg BID. Works currently-unable to take time off of work. No other complaints/concerns.

## 2014-08-17 NOTE — ED Provider Notes (Signed)
Patient left prior to being seen   Cherylann Parr, PA-C 08/17/14 1538

## 2014-08-17 NOTE — ED Notes (Signed)
Patient aware PA would be with her shortly. Left without being seen or being discharged.

## 2014-08-18 ENCOUNTER — Encounter (HOSPITAL_COMMUNITY): Payer: Self-pay | Admitting: Emergency Medicine

## 2014-08-18 ENCOUNTER — Emergency Department (HOSPITAL_COMMUNITY)
Admission: EM | Admit: 2014-08-18 | Discharge: 2014-08-18 | Disposition: A | Discharge status: Home / Self Care | Payer: No Typology Code available for payment source | Attending: Emergency Medicine | Admitting: Emergency Medicine

## 2014-08-18 DIAGNOSIS — Z79899 Other long term (current) drug therapy: Secondary | ICD-10-CM | POA: Insufficient documentation

## 2014-08-18 DIAGNOSIS — Z8742 Personal history of other diseases of the female genital tract: Secondary | ICD-10-CM | POA: Insufficient documentation

## 2014-08-18 DIAGNOSIS — I10 Essential (primary) hypertension: Secondary | ICD-10-CM | POA: Insufficient documentation

## 2014-08-18 DIAGNOSIS — R110 Nausea: Secondary | ICD-10-CM | POA: Insufficient documentation

## 2014-08-18 DIAGNOSIS — Z760 Encounter for issue of repeat prescription: Secondary | ICD-10-CM | POA: Insufficient documentation

## 2014-08-18 MED ORDER — CLONIDINE HCL 0.2 MG PO TABS: 0.2000 mg | ORAL_TABLET | Freq: Three times a day (TID) | ORAL | Status: DC

## 2014-08-18 MED ORDER — CLONIDINE HCL 0.1 MG PO TABS
0.2000 mg | ORAL_TABLET | Freq: Once | ORAL | Status: AC
  Administered 2014-08-18: 0.2 mg via ORAL
  Filled 2014-08-18: qty 2

## 2014-08-18 NOTE — ED Notes (Signed)
Awake. Verbally responsive. Resp even and unlabored. ABC's intact. NAD noted.

## 2014-08-18 NOTE — ED Provider Notes (Signed)
CSN: [id number]     Arrival date & time 08/18/14  0012 History   First MD Initiated Contact with Patient 08/18/14 0024     Chief Complaint  Patient presents with  . Medication Refill  . Nausea     (Consider location/radiation/quality/duration/timing/severity/associated sxs/prior Treatment) HPI Comments: Patient is a 48 year old female with a past medical history of hypertension who presents for a medication refill of her Clonidine. Patient reports she ran out of her prescription due to increased dose and was unable to follow up with her doctor until next week. Patient reports associated nausea when she does not take her medication. No other symptoms. No aggravating/alleviating factors.    Past Medical History  Diagnosis Date  . Hypertension   . Dysmenorrhea    Past Surgical History  Procedure Laterality Date  . Gallbladder surgery  2011  . Hand surgery  20 years ago    surgery due to infection per pt.   Family History  Problem Relation Age of Onset  . Adopted: Yes   History  Substance Use Topics  . Smoking status: Never Smoker   . Smokeless tobacco: Not on file  . Alcohol Use: No     Comment: LAST TIME-   2 MTHS AGO   OB History   Grav Para Term Preterm Abortions TAB SAB Ect Mult Living   2    2  2         Review of Systems  Constitutional:       Medication refill  Gastrointestinal: Positive for nausea.  All other systems reviewed and are negative.     Allergies  Ciprofloxacin  Home Medications   Prior to Admission medications   Medication Sig Start Date End Date Taking? Authorizing Provider  cloNIDine (CATAPRES) 0.3 MG tablet Take 0.3 mg by mouth 2 (two) times daily.   Yes Historical Provider, MD  naproxen (NAPROSYN) 500 MG tablet Take 500 mg by mouth 2 (two) times daily as needed for moderate pain.   Yes Historical Provider, MD  traMADol (ULTRAM) 50 MG tablet Take by mouth every 6 (six) hours as needed for moderate pain.   Yes Historical Provider, MD    BP 154/96  Pulse 78  Temp(Src) 98.4 F (36.9 C) (Oral)  Resp 19  SpO2 100%  LMP 08/10/2014 Physical Exam  Nursing note and vitals reviewed. Constitutional: She is oriented to person, place, and time. She appears well-developed and well-nourished. No distress.  HENT:  Head: Normocephalic and atraumatic.  Eyes: Conjunctivae and EOM are normal.  Neck: Normal range of motion.  Cardiovascular: Normal rate and regular rhythm.  Exam reveals no gallop and no friction rub.   No murmur heard. Pulmonary/Chest: Effort normal and breath sounds normal. She has no wheezes. She has no rales. She exhibits no tenderness.  Abdominal: Soft. There is no tenderness.  Musculoskeletal: Normal range of motion.  Neurological: She is alert and oriented to person, place, and time.  Speech is goal-oriented. Moves limbs without ataxia.   Skin: Skin is warm and dry.  Psychiatric: She has a normal mood and affect. Her behavior is normal.    ED Course  Procedures (including critical care time) Labs Review Labs Reviewed - No data to display  Imaging Review No results found.   EKG Interpretation None      MDM   Final diagnoses:  Medication refill    2:19 AM Patient will have clonidine refilled. Patient has a PCP appointment scheduled for the beginning of November. Vitals  stable and patient afebrile.     Alvina Chou, [place] 08/19/14 [phone]

## 2014-08-18 NOTE — ED Notes (Signed)
Pt reports she was just here earlier today for same but left before being seen.  Pt reports she needs a rx for her BP medicine.  Pt also reports nausea.

## 2014-08-18 NOTE — Discharge Instructions (Signed)
Take Clonidine as directed for blood pressure. Refer to attached documents for more information.

## 2014-08-20 NOTE — ED Provider Notes (Signed)
Medical screening examination/treatment/procedure(s) were performed by non-physician practitioner and as supervising physician I was immediately available for consultation/collaboration.   EKG Interpretation None        Everlene Balls, MD 08/20/14 1002

## 2014-08-25 ENCOUNTER — Encounter (HOSPITAL_COMMUNITY): Payer: Self-pay | Admitting: Emergency Medicine

## 2014-08-25 ENCOUNTER — Emergency Department (HOSPITAL_COMMUNITY)
Admission: EM | Admit: 2014-08-25 | Discharge: 2014-08-25 | Disposition: A | Discharge status: Home / Self Care | Payer: No Typology Code available for payment source | Attending: Emergency Medicine | Admitting: Emergency Medicine

## 2014-08-25 DIAGNOSIS — Z79899 Other long term (current) drug therapy: Secondary | ICD-10-CM | POA: Insufficient documentation

## 2014-08-25 DIAGNOSIS — M542 Cervicalgia: Secondary | ICD-10-CM | POA: Insufficient documentation

## 2014-08-25 DIAGNOSIS — Z8742 Personal history of other diseases of the female genital tract: Secondary | ICD-10-CM | POA: Insufficient documentation

## 2014-08-25 DIAGNOSIS — Z791 Long term (current) use of non-steroidal anti-inflammatories (NSAID): Secondary | ICD-10-CM | POA: Insufficient documentation

## 2014-08-25 DIAGNOSIS — Z760 Encounter for issue of repeat prescription: Secondary | ICD-10-CM

## 2014-08-25 DIAGNOSIS — I10 Essential (primary) hypertension: Secondary | ICD-10-CM | POA: Insufficient documentation

## 2014-08-25 DIAGNOSIS — R51 Headache: Secondary | ICD-10-CM | POA: Insufficient documentation

## 2014-08-25 MED ORDER — IBUPROFEN 800 MG PO TABS
800.0000 mg | ORAL_TABLET | Freq: Once | ORAL | Status: AC
  Administered 2014-08-25: 800 mg via ORAL
  Filled 2014-08-25: qty 1

## 2014-08-25 MED ORDER — TRAMADOL HCL 50 MG PO TABS: 50.0000 mg | ORAL_TABLET | Freq: Four times a day (QID) | ORAL | Status: DC | PRN

## 2014-08-25 MED ORDER — CLONIDINE HCL 0.2 MG PO TABS: 0.2000 mg | ORAL_TABLET | Freq: Two times a day (BID) | ORAL | Status: DC

## 2014-08-25 NOTE — ED Notes (Signed)
Pt here for medication refill of clonidine for HTN.  Has been here several times for same.

## 2014-08-25 NOTE — ED Provider Notes (Signed)
CSN: [id number]     Arrival date & time 08/25/14  1631 History  This chart was scribed for non-physician practitioner, Alvina Chou, PA-C, working with Ernestina Patches, MD, by Jeanell Sparrow, ED Scribe. This patient was seen in room WTR7/WTR7 and the patient's care was started at 5:53 PM.   Chief Complaint  Patient presents with  . Medication Refill   The history is provided by the patient. No language interpreter was used.   HPI Comments: Regina Estes is a 48 y.o. female who presents to the Emergency Department complaining of a need for a medication refill for catapres. She reports that she could not get an appointment with her PCP for another month due to insurance purposes. She reports associated headache and neck pain.   Past Medical History  Diagnosis Date  . Hypertension   . Dysmenorrhea    Past Surgical History  Procedure Laterality Date  . Gallbladder surgery  2011  . Hand surgery  20 years ago    surgery due to infection per pt.   Family History  Problem Relation Age of Onset  . Adopted: Yes   History  Substance Use Topics  . Smoking status: Never Smoker   . Smokeless tobacco: Not on file  . Alcohol Use: No     Comment: LAST TIME-   2 MTHS AGO   OB History    Gravida Para Term Preterm AB TAB SAB Ectopic Multiple Living   2    2  2         Review of Systems  Musculoskeletal: Positive for neck pain.  Neurological: Positive for headaches.  All other systems reviewed and are negative.   Allergies  Ciprofloxacin  Home Medications   Prior to Admission medications   Medication Sig Start Date End Date Taking? Authorizing Provider  cloNIDine (CATAPRES) 0.2 MG tablet Take 1 tablet (0.2 mg total) by mouth 3 (three) times daily. 08/18/14   Breylen Agyeman, PA-C  cloNIDine (CATAPRES) 0.3 MG tablet Take 0.3 mg by mouth 2 (two) times daily.    Historical Provider, MD  naproxen (NAPROSYN) 500 MG tablet Take 500 mg by mouth 2 (two) times daily as needed for moderate  pain.    Historical Provider, MD  traMADol (ULTRAM) 50 MG tablet Take by mouth every 6 (six) hours as needed for moderate pain.    Historical Provider, MD   BP 152/107 mmHg  Pulse 105  Temp(Src) 98.3 F (36.8 C) (Oral)  Resp 18  SpO2 100%  LMP 08/10/2014 Physical Exam  Constitutional: She appears well-developed and well-nourished. No distress.  HENT:  Head: Normocephalic and atraumatic.  Eyes: Conjunctivae are normal.  Neck: Normal range of motion.  Cardiovascular: Normal rate and regular rhythm.  Exam reveals no gallop and no friction rub.   No murmur heard. Pulmonary/Chest: Effort normal and breath sounds normal. She has no wheezes. She has no rales. She exhibits no tenderness.  Abdominal: Soft. There is no tenderness.  Musculoskeletal: Normal range of motion.  Neurological: She is alert.  Speech is goal-oriented. Moves limbs without ataxia.   Skin: Skin is warm and dry.  Psychiatric: She has a normal mood and affect. Her behavior is normal.  Nursing note and vitals reviewed.   ED Course  Procedures (including critical care time) DIAGNOSTIC STUDIES: Oxygen Saturation is 100% on RA, normal by my interpretation.    COORDINATION OF CARE: 5:57 PM- Pt advised of plan for treatment which includes medication and pt agrees.  Labs Review Labs Reviewed -  No data to display  Imaging Review No results found.   EKG Interpretation None      MDM   Final diagnoses:  Medication refill    Patient will have refill of clonidine. Patient instructed to follow up with PCP as instructed. Vitals stable and patient afebrile.   I personally performed the services described in this documentation, which was scribed in my presence. The recorded information has been reviewed and is accurate.     Alvina Chou, PA-C 08/26/14 1347

## 2014-08-25 NOTE — Discharge Instructions (Signed)
Follow up with your doctor as scheduled on 09/20/2014 as scheduled.

## 2014-08-26 ENCOUNTER — Encounter (HOSPITAL_COMMUNITY): Payer: Self-pay | Admitting: Emergency Medicine

## 2014-08-28 ENCOUNTER — Encounter (HOSPITAL_COMMUNITY): Payer: Self-pay

## 2014-08-28 ENCOUNTER — Emergency Department (HOSPITAL_COMMUNITY)
Admission: EM | Admit: 2014-08-28 | Discharge: 2014-08-28 | Disposition: A | Discharge status: Home / Self Care | Payer: No Typology Code available for payment source | Attending: Emergency Medicine | Admitting: Emergency Medicine

## 2014-08-28 DIAGNOSIS — N946 Dysmenorrhea, unspecified: Secondary | ICD-10-CM | POA: Insufficient documentation

## 2014-08-28 DIAGNOSIS — Z3202 Encounter for pregnancy test, result negative: Secondary | ICD-10-CM | POA: Insufficient documentation

## 2014-08-28 DIAGNOSIS — Z79899 Other long term (current) drug therapy: Secondary | ICD-10-CM | POA: Insufficient documentation

## 2014-08-28 DIAGNOSIS — Z791 Long term (current) use of non-steroidal anti-inflammatories (NSAID): Secondary | ICD-10-CM | POA: Insufficient documentation

## 2014-08-28 DIAGNOSIS — E663 Overweight: Secondary | ICD-10-CM | POA: Insufficient documentation

## 2014-08-28 DIAGNOSIS — I10 Essential (primary) hypertension: Secondary | ICD-10-CM | POA: Insufficient documentation

## 2014-08-28 DIAGNOSIS — R109 Unspecified abdominal pain: Secondary | ICD-10-CM

## 2014-08-28 LAB — CBC WITH DIFFERENTIAL/PLATELET
BASOS ABS: 0 10*3/uL (ref 0.0–0.1)
Basophils Relative: 0 % (ref 0–1)
EOS PCT: 4 % (ref 0–5)
Eosinophils Absolute: 0.3 10*3/uL (ref 0.0–0.7)
HCT: 33.7 % — ABNORMAL LOW (ref 36.0–46.0)
Hemoglobin: 11.5 g/dL — ABNORMAL LOW (ref 12.0–15.0)
Lymphocytes Relative: 35 % (ref 12–46)
Lymphs Abs: 2.3 10*3/uL (ref 0.7–4.0)
MCH: 30.3 pg (ref 26.0–34.0)
MCHC: 34.1 g/dL (ref 30.0–36.0)
MCV: 88.7 fL (ref 78.0–100.0)
Monocytes Absolute: 0.5 10*3/uL (ref 0.1–1.0)
Monocytes Relative: 7 % (ref 3–12)
Neutro Abs: 3.6 10*3/uL (ref 1.7–7.7)
Neutrophils Relative %: 54 % (ref 43–77)
PLATELETS: 272 10*3/uL (ref 150–400)
RBC: 3.8 MIL/uL — AB (ref 3.87–5.11)
RDW: 13.9 % (ref 11.5–15.5)
WBC: 6.7 10*3/uL (ref 4.0–10.5)

## 2014-08-28 LAB — COMPREHENSIVE METABOLIC PANEL
ALK PHOS: 65 U/L (ref 39–117)
ALT: 11 U/L (ref 0–35)
AST: 17 U/L (ref 0–37)
Albumin: 3.4 g/dL — ABNORMAL LOW (ref 3.5–5.2)
Anion gap: 13 (ref 5–15)
BUN: 6 mg/dL (ref 6–23)
CHLORIDE: 107 meq/L (ref 96–112)
CO2: 19 mEq/L (ref 19–32)
Calcium: 9.2 mg/dL (ref 8.4–10.5)
Creatinine, Ser: 0.66 mg/dL (ref 0.50–1.10)
GFR calc Af Amer: >90 mL/min (ref >90)
GFR calc non Af Amer: >90 mL/min (ref >90)
Glucose, Bld: 111 mg/dL — ABNORMAL HIGH (ref 70–99)
Potassium: 3.4 mEq/L — ABNORMAL LOW (ref 3.7–5.3)
Sodium: 139 mEq/L (ref 137–147)
Total Protein: 6.8 g/dL (ref 6.0–8.3)

## 2014-08-28 LAB — URINALYSIS, ROUTINE W REFLEX MICROSCOPIC
BILIRUBIN URINE: NEGATIVE
GLUCOSE, UA: NEGATIVE mg/dL
HGB URINE DIPSTICK: NEGATIVE
Ketones, ur: NEGATIVE mg/dL
Leukocytes, UA: NEGATIVE
Nitrite: NEGATIVE
PH: 8 (ref 5.0–8.0)
Protein, ur: NEGATIVE mg/dL
SPECIFIC GRAVITY, URINE: 1.015 (ref 1.005–1.030)
Urobilinogen, UA: 1 mg/dL (ref 0.0–1.0)

## 2014-08-28 LAB — POC URINE PREG, ED: Preg Test, Ur: NEGATIVE

## 2014-08-28 LAB — LIPASE, BLOOD: Lipase: 34 U/L (ref 11–59)

## 2014-08-28 MED ORDER — TRAMADOL HCL 50 MG PO TABS: 50.0000 mg | ORAL_TABLET | Freq: Four times a day (QID) | ORAL | Status: DC | PRN

## 2014-08-28 NOTE — ED Provider Notes (Signed)
CSN: [id number]     Arrival date & time 08/28/14  1651 History   First MD Initiated Contact with Patient 08/28/14 1924     Chief Complaint  Patient presents with  . Abdominal Cramping     (Consider location/radiation/quality/duration/timing/severity/associated sxs/prior Treatment) The history is provided by the patient. No language interpreter was used.  Regina Estes is a 48 are old female past medical history of hypertension and dysmenorrhea presenting to the emergency department with abdominal cramping that has been ongoing for the past couple of days. Patient reports that she has a long history of painful periods-stated that when she had her period beginning when she was 48 years of age always experienced discomfort in the first couple of days of her menstrual cycle. Stated for the past 6 months she has been experiencing abdominal cramping approximately 2-3 days prior to her menstrual cycle. Stated that she's been seen and assessed in the setting numerous times regarding this issue, reported that she was diagnosed with ovarian cysts as noted on ultrasounds. Stated that she normally takes ibuprofen 800 mg with relief-but stated that since she was diagnosed with ovarian cysts she's been resorting to tramadol. Patient reported that she has ran out of her tramadol approximately 1 week ago. Stated that she can no longer take this pain. Reported that she hasn't appointment with OB/GYN, Dr. Randol Kern discussing possible partial hysterectomy next week. Reported that she is not sexually active. Denied fever, chills, nausea, vomiting, diarrhea, melena, hematochezia, dizziness, back pain, neck pain, weakness. PCP none  Past Medical History  Diagnosis Date  . Hypertension   . Dysmenorrhea    Past Surgical History  Procedure Laterality Date  . Gallbladder surgery  2011  . Hand surgery  20 years ago    surgery due to infection per pt.   Family History  Problem Relation Age of Onset  . Adopted: Yes    History  Substance Use Topics  . Smoking status: Never Smoker   . Smokeless tobacco: Not on file  . Alcohol Use: No     Comment: LAST TIME-   2 MTHS AGO   OB History    Gravida Para Term Preterm AB TAB SAB Ectopic Multiple Living   2    2  2         Review of Systems  Constitutional: Negative for fever and chills.  Eyes: Negative for visual disturbance.  Respiratory: Negative for chest tightness and shortness of breath.   Cardiovascular: Negative for chest pain.  Gastrointestinal: Negative for nausea, vomiting, abdominal pain, diarrhea, constipation, blood in stool and anal bleeding.  Genitourinary: Positive for vaginal bleeding and pelvic pain. Negative for dysuria, frequency, vaginal discharge and vaginal pain.  Musculoskeletal: Negative for back pain and neck pain.  Neurological: Negative for dizziness, weakness and headaches.      Allergies  Ciprofloxacin  Home Medications   Prior to Admission medications   Medication Sig Start Date End Date Taking? Authorizing Provider  cloNIDine (CATAPRES) 0.2 MG tablet Take 1 tablet (0.2 mg total) by mouth 2 (two) times daily. Patient taking differently: Take 0.2 mg by mouth 3 (three) times daily.  08/25/14  Yes Kaitlyn Szekalski, PA-C  ibuprofen (ADVIL,MOTRIN) 800 MG tablet Take 800 mg by mouth daily as needed for moderate pain.  07/12/14  Yes Historical Provider, MD  naproxen (NAPROSYN) 500 MG tablet Take 500 mg by mouth 2 (two) times daily as needed for moderate pain.    Historical Provider, MD  traMADol (ULTRAM) 50 MG tablet  Take 1 tablet (50 mg total) by mouth every 6 (six) hours as needed for moderate pain. 08/28/14   Danni Leabo, PA-C   BP 124/83 mmHg  Pulse 96  Temp(Src) 98.6 F (37 C) (Oral)  Resp 18  SpO2 100%  LMP 08/28/2014 (Exact Date) Physical Exam  Constitutional: She is oriented to person, place, and time. She appears well-developed and well-nourished. No distress.  HENT:  Head: Normocephalic and atraumatic.   Mouth/Throat: No oropharyngeal exudate.  Eyes: Conjunctivae and EOM are normal. Pupils are equal, round, and reactive to light. Right eye exhibits no discharge. Left eye exhibits no discharge.  Neck: Normal range of motion. Neck supple.  Cardiovascular: Normal rate, regular rhythm and normal heart sounds.  Exam reveals no friction rub.   No murmur heard. Pulses:      Radial pulses are 2+ on the right side, and 2+ on the left side.       Dorsalis pedis pulses are 2+ on the right side, and 2+ on the left side.  Pulmonary/Chest: Effort normal and breath sounds normal. No respiratory distress. She has no wheezes. She has no rales.  Abdominal: Soft. Bowel sounds are normal. She exhibits no distension. There is tenderness in the right lower quadrant, suprapubic area and left lower quadrant. There is no rebound and no guarding.  Overweight Negative abdominal distention Bowel sounds normal active bowel 4 quadrants Abdomen soft upon palpation Discomfort upon palpation to the suprapubic, right lower and left lower quadrants Voluntary guarding Negative peritoneal signs  Musculoskeletal: Normal range of motion.  Full ROM to upper and lower extremities without difficulty noted, negative ataxia noted.  Lymphadenopathy:    She has no cervical adenopathy.  Neurological: She is alert and oriented to person, place, and time. No cranial nerve deficit. She exhibits normal muscle tone. Coordination normal.  Skin: Skin is warm and dry. No rash noted. She is not diaphoretic. No erythema.  Psychiatric: She has a normal mood and affect. Her behavior is normal. Thought content normal.  Nursing note and vitals reviewed.   ED Course  Procedures (including critical care time)  Results for orders placed or performed during the hospital encounter of 08/28/14  CBC with Differential  Result Value Ref Range   WBC 6.7 4.0 - 10.5 K/uL   RBC 3.80 (L) 3.87 - 5.11 MIL/uL   Hemoglobin 11.5 (L) 12.0 - 15.0 g/dL   HCT  33.7 (L) 36.0 - 46.0 %   MCV 88.7 78.0 - 100.0 fL   MCH 30.3 26.0 - 34.0 pg   MCHC 34.1 30.0 - 36.0 g/dL   RDW 13.9 11.5 - 15.5 %   Platelets 272 150 - 400 K/uL   Neutrophils Relative % 54 43 - 77 %   Lymphocytes Relative 35 12 - 46 %   Monocytes Relative 7 3 - 12 %   Eosinophils Relative 4 0 - 5 %   Basophils Relative 0 0 - 1 %   Neutro Abs 3.6 1.7 - 7.7 K/uL   Lymphs Abs 2.3 0.7 - 4.0 K/uL   Monocytes Absolute 0.5 0.1 - 1.0 K/uL   Eosinophils Absolute 0.3 0.0 - 0.7 K/uL   Basophils Absolute 0.0 0.0 - 0.1 K/uL   RBC Morphology ACANTHOCYTES   Comprehensive metabolic panel  Result Value Ref Range   Sodium 139 137 - 147 mEq/L   Potassium 3.4 (L) 3.7 - 5.3 mEq/L   Chloride 107 96 - 112 mEq/L   CO2 19 19 - 32 mEq/L  Glucose, Bld 111 (H) 70 - 99 mg/dL   BUN 6 6 - 23 mg/dL   Creatinine, Ser 0.66 0.50 - 1.10 mg/dL   Calcium 9.2 8.4 - 10.5 mg/dL   Total Protein 6.8 6.0 - 8.3 g/dL   Albumin 3.4 (L) 3.5 - 5.2 g/dL   AST 17 0 - 37 U/L   ALT 11 0 - 35 U/L   Alkaline Phosphatase 65 39 - 117 U/L   Total Bilirubin <0.2 (L) 0.3 - 1.2 mg/dL   GFR calc non Af Amer >90 >90 mL/min   GFR calc Af Amer >90 >90 mL/min   Anion gap 13 5 - 15  Lipase, blood  Result Value Ref Range   Lipase 34 11 - 59 U/L  Urinalysis, Routine w reflex microscopic  Result Value Ref Range   Color, Urine YELLOW YELLOW   APPearance CLOUDY (A) CLEAR   Specific Gravity, Urine 1.015 1.005 - 1.030   pH 8.0 5.0 - 8.0   Glucose, UA NEGATIVE NEGATIVE mg/dL   Hgb urine dipstick NEGATIVE NEGATIVE   Bilirubin Urine NEGATIVE NEGATIVE   Ketones, ur NEGATIVE NEGATIVE mg/dL   Protein, ur NEGATIVE NEGATIVE mg/dL   Urobilinogen, UA 1.0 0.0 - 1.0 mg/dL   Nitrite NEGATIVE NEGATIVE   Leukocytes, UA NEGATIVE NEGATIVE  POC urine preg, ED (not at [facility])  Result Value Ref Range   Preg Test, Ur NEGATIVE NEGATIVE    Labs Review Labs Reviewed  CBC WITH DIFFERENTIAL - Abnormal; Notable for the following:    RBC 3.80 (*)     Hemoglobin 11.5 (*)    HCT 33.7 (*)    All other components within normal limits  COMPREHENSIVE METABOLIC PANEL - Abnormal; Notable for the following:    Potassium 3.4 (*)    Glucose, Bld 111 (*)    Albumin 3.4 (*)    Total Bilirubin <0.2 (*)    All other components within normal limits  URINALYSIS, ROUTINE W REFLEX MICROSCOPIC - Abnormal; Notable for the following:    APPearance CLOUDY (*)    All other components within normal limits  LIPASE, BLOOD  HIV ANTIBODY (ROUTINE TESTING)  POC URINE PREG, ED    Imaging Review No results found.   EKG Interpretation None      MDM   Final diagnoses:  Dysmenorrhea  Abdominal cramping    Medications - No data to display  Filed Vitals:   08/28/14 1706 08/28/14 1949  BP: 135/100 124/83  Pulse: 99 96  Temp: 98.6 F (37 C)   TempSrc: Oral   Resp: 18   SpO2: 100% 100%   This provider reviewed the patient's chart. Patient has been seen and assessed in ED setting numerous times regarding dysmenorrhea. Patient had ultrasounds performed in March 2015 that identified simple appearing cyst in the ovaries, possible hemorrhagic cyst on the left ovary. Patient was seen and assessed on 07/17/2014 for dysmenorrhea and she was discharged with naproxen and tramadol. Patient was seen and assessed in ED setting on 08/10/2014 where what the full workup was performed with unremarkable findings-patient did have a possible bacterial vaginosis treated. CBC negative elevated white blood cell count. Hemoglobin 11.5, hematocrit 33.7. CMP unremarkable, glucose 111-negative elevated anion gap, 13.0 mEq per liter. Lipase negative elevation. Urinalysis negative for infection-negative nitrites or leukocytes identified. Negative blood in urine. Negative urine pregnancy. HIV pending.  This provider wanted to perform a pelvic exam secondary to pelvic pain and to rule out possible infection, patient had bacterial vaginosis in 08/10/2014 visit  demonstrated with Flagyl.  Patient refused pelvic exam-reported that she is not sexually active and does not have an infection. Patient reports that she knows what is going on with her emesis or menstrual cycle cramping, reported that all she needs is her Tramadol. Patient refusing IV. Doubt TOA. Doubt acute abdominal processes. Labs unremarkable. Vitals stable. Suspicion to be abdominal cramping secondary to menstrual cycles - patient reported that she has been dealing with this since she was younger. Patient stable, afebrile. Patient had septic appearing. Discharged patient. Discharge patient with tramadol - discussed course, precautions, disposal technique. Referred patient to primary care provider. Discussed the patient to keep appointment with OB/GYN next week. Discussed with patient to closely monitor symptoms and if symptoms are to worsen or change to report back to the ED - strict return instructions given.  Patient agreed to plan of care, understood, all questions answered.   Jamse Mead, PA-C 08/28/14 2209  Leota Jacobsen, MD 08/28/14 2318

## 2014-08-28 NOTE — ED Notes (Signed)
Pt reports menstrual cramps x 2 days. States she has been having increased pain with menstrual cycle for last several months and "the only thing that helps is Ultram." Pt denies any changes to cycle. States she seen by OBGYN for same pain and was told it "was because I could be going through menopause." Pt in NAD. VSS.

## 2014-08-28 NOTE — ED Notes (Signed)
Patient is back in room.  °

## 2014-08-28 NOTE — ED Notes (Signed)
Pt left room to go smoke a cigarette.

## 2014-08-28 NOTE — ED Notes (Signed)
Pt monitored by pulse ox and bp cuff. Per RN.

## 2014-08-28 NOTE — Discharge Instructions (Signed)
Please call your doctor for a followup appointment within 24-48 hours. When you talk to your doctor please let them know that you were seen in the emergency department and have them acquire all of your records so that they can discuss the findings with you and formulate a treatment plan to fully care for your new and ongoing problems. Please call and set-up an appointment with your primary care provider Please rest and stay hydrated Please keep appointment with your OBGYN next week  Please take medications as prescribed - while on pain medications there is to be no drinking alcohol, driving, operating any heavy machinery. If extra please dispose in a proper manner. Please do not take any extra Tylenol with this medication for this can lead to Tylenol overdose and liver issues.  Please continue to monitor symptoms closely and if symptoms are to worsen or change (fever greater than 101, chills, sweating, nausea, vomiting, chest pain, shortness of breathe, difficulty breathing, weakness, numbness, tingling, worsening or changes to pain pattern, Increased vaginal bleeding, fainting, weakness, loss of sensation) please report back to the Emergency Department immediately.    Abdominal Pain, Women Abdominal (stomach, pelvic, or belly) pain can be caused by many things. It is important to tell your doctor:  The location of the pain.  Does it come and go or is it present all the time?  Are there things that start the pain (eating certain foods, exercise)?  Are there other symptoms associated with the pain (fever, nausea, vomiting, diarrhea)? All of this is helpful to know when trying to find the cause of the pain. CAUSES   Stomach: virus or bacteria infection, or ulcer.  Intestine: appendicitis (inflamed appendix), regional ileitis (Crohn's disease), ulcerative colitis (inflamed colon), irritable bowel syndrome, diverticulitis (inflamed diverticulum of the colon), or cancer of the stomach or  intestine.  Gallbladder disease or stones in the gallbladder.  Kidney disease, kidney stones, or infection.  Pancreas infection or cancer.  Fibromyalgia (pain disorder).  Diseases of the female organs:  Uterus: fibroid (non-cancerous) tumors or infection.  Fallopian tubes: infection or tubal pregnancy.  Ovary: cysts or tumors.  Pelvic adhesions (scar tissue).  Endometriosis (uterus lining tissue growing in the pelvis and on the pelvic organs).  Pelvic congestion syndrome (female organs filling up with blood just before the menstrual period).  Pain with the menstrual period.  Pain with ovulation (producing an egg).  Pain with an IUD (intrauterine device, birth control) in the uterus.  Cancer of the female organs.  Functional pain (pain not caused by a disease, may improve without treatment).  Psychological pain.  Depression. DIAGNOSIS  Your doctor will decide the seriousness of your pain by doing an examination.  Blood tests.  X-rays.  Ultrasound.  CT scan (computed tomography, special type of X-ray).  MRI (magnetic resonance imaging).  Cultures, for infection.  Barium enema (dye inserted in the large intestine, to better view it with X-rays).  Colonoscopy (looking in intestine with a lighted tube).  Laparoscopy (minor surgery, looking in abdomen with a lighted tube).  Major abdominal exploratory surgery (looking in abdomen with a large incision). TREATMENT  The treatment will depend on the cause of the pain.   Many cases can be observed and treated at home.  Over-the-counter medicines recommended by your caregiver.  Prescription medicine.  Antibiotics, for infection.  Birth control pills, for painful periods or for ovulation pain.  Hormone treatment, for endometriosis.  Nerve blocking injections.  Physical therapy.  Antidepressants.  Counseling with a  psychologist or psychiatrist.  Minor or major surgery. HOME CARE INSTRUCTIONS   Do  not take laxatives, unless directed by your caregiver.  Take over-the-counter pain medicine only if ordered by your caregiver. Do not take aspirin because it can cause an upset stomach or bleeding.  Try a clear liquid diet (broth or water) as ordered by your caregiver. Slowly move to a bland diet, as tolerated, if the pain is related to the stomach or intestine.  Have a thermometer and take your temperature several times a day, and record it.  Bed rest and sleep, if it helps the pain.  Avoid sexual intercourse, if it causes pain.  Avoid stressful situations.  Keep your follow-up appointments and tests, as your caregiver orders.  If the pain does not go away with medicine or surgery, you may try:  Acupuncture.  Relaxation exercises (yoga, meditation).  Group therapy.  Counseling. SEEK MEDICAL CARE IF:   You notice certain foods cause stomach pain.  Your home care treatment is not helping your pain.  You need stronger pain medicine.  You want your IUD removed.  You feel faint or lightheaded.  You develop nausea and vomiting.  You develop a rash.  You are having side effects or an allergy to your medicine. SEEK IMMEDIATE MEDICAL CARE IF:   Your pain does not go away or gets worse.  You have a fever.  Your pain is felt only in portions of the abdomen. The right side could possibly be appendicitis. The left lower portion of the abdomen could be colitis or diverticulitis.  You are passing blood in your stools (bright red or black tarry stools, with or without vomiting).  You have blood in your urine.  You develop chills, with or without a fever.  You pass out. MAKE SURE YOU:   Understand these instructions.  Will watch your condition.  Will get help right away if you are not doing well or get worse. Document Released: 08/08/2007 Document Revised: 02/25/2014 Document Reviewed: 08/28/2009 [facility] Patient Information 2015 [place], [place]. This information is  not intended to replace advice given to you by your health care provider. Make sure you discuss any questions you have with your health care provider. Dysmenorrhea Menstrual cramps (dysmenorrhea) are caused by the muscles of the uterus tightening (contracting) during a menstrual period. For some women, this discomfort is merely bothersome. For others, dysmenorrhea can be severe enough to interfere with everyday activities for a few days each month. Primary dysmenorrhea is menstrual cramps that last a couple of days when you start having menstrual periods or soon after. This often begins after a teenager starts having her period. As a woman gets older or has a baby, the cramps will usually lessen or disappear. Secondary dysmenorrhea begins later in life, lasts longer, and the pain may be stronger than primary dysmenorrhea. The pain may start before the period and last a few days after the period.  CAUSES  Dysmenorrhea is usually caused by an underlying problem, such as:  The tissue lining the uterus grows outside of the uterus in other areas of the body (endometriosis).  The endometrial tissue, which normally lines the uterus, is found in or grows into the muscular walls of the uterus (adenomyosis).  The pelvic blood vessels are engorged with blood just before the menstrual period (pelvic congestive syndrome).  Overgrowth of cells (polyps) in the lining of the uterus or cervix.  Falling down of the uterus (prolapse) because of loose or stretched ligaments.  Depression.  Bladder problems, infection, or inflammation.  Problems with the intestine, a tumor, or irritable bowel syndrome.  Cancer of the female organs or bladder.  A severely tipped uterus.  A very tight opening or closed cervix.  Noncancerous tumors of the uterus (fibroids).  Pelvic inflammatory disease (PID).  Pelvic scarring (adhesions) from a previous surgery.  Ovarian cyst.  An intrauterine device (IUD) used for birth  control. RISK FACTORS You may be at greater risk of dysmenorrhea if:  You are younger than age 13.  You started puberty early.  You have irregular or heavy bleeding.  You have never given birth.  You have a family history of this problem.  You are a smoker. SIGNS AND SYMPTOMS   Cramping or throbbing pain in your lower abdomen.  Headaches.  Lower back pain.  Nausea or vomiting.  Diarrhea.  Sweating or dizziness.  Loose stools. DIAGNOSIS  A diagnosis is based on your history, symptoms, physical exam, diagnostic tests, or procedures. Diagnostic tests or procedures may include:  Blood tests.  Ultrasonography.  An examination of the lining of the uterus (dilation and curettage, D&C).  An examination inside your abdomen or pelvis with a scope (laparoscopy).  X-rays.  CT scan.  MRI.  An examination inside the bladder with a scope (cystoscopy).  An examination inside the intestine or stomach with a scope (colonoscopy, gastroscopy). TREATMENT  Treatment depends on the cause of the dysmenorrhea. Treatment may include:  Pain medicine prescribed by your health care provider.  Birth control pills or an IUD with progesterone hormone in it.  Hormone replacement therapy.  Nonsteroidal anti-inflammatory drugs (NSAIDs). These may help stop the production of prostaglandins.  Surgery to remove adhesions, endometriosis, ovarian cyst, or fibroids.  Removal of the uterus (hysterectomy).  Progesterone shots to stop the menstrual period.  Cutting the nerves on the sacrum that go to the female organs (presacral neurectomy).  Electric current to the sacral nerves (sacral nerve stimulation).  Antidepressant medicine.  Psychiatric therapy, counseling, or group therapy.  Exercise and physical therapy.  Meditation and yoga therapy.  Acupuncture. HOME CARE INSTRUCTIONS   Only take over-the-counter or prescription medicines as directed by your health care  provider.  Place a heating pad or hot water bottle on your lower back or abdomen. Do not sleep with the heating pad.  Use aerobic exercises, walking, swimming, biking, and other exercises to help lessen the cramping.  Massage to the lower back or abdomen may help.  Stop smoking.  Avoid alcohol and caffeine. SEEK MEDICAL CARE IF:   Your pain does not get better with medicine.  You have pain with sexual intercourse.  Your pain increases and is not controlled with medicines.  You have abnormal vaginal bleeding with your period.  You develop nausea or vomiting with your period that is not controlled with medicine. SEEK IMMEDIATE MEDICAL CARE IF:  You pass out.  Document Released: 10/11/2005 Document Revised: 06/13/2013 Document Reviewed: 03/29/2013 [facility] Patient Information 2015 [place], [place]. This information is not intended to replace advice given to you by your health care provider. Make sure you discuss any questions you have with your health care provider.   Emergency Department Resource Guide 1) Find a Doctor and Pay Out of Pocket Although you won't have to find out who is covered by your insurance plan, it is a good idea to ask around and get recommendations. You will then need to call the office and see if the doctor you have chosen will accept you as a  new patient and what types of options they offer for patients who are self-pay. Some doctors offer discounts or will set up payment plans for their patients who do not have insurance, but you will need to ask so you aren't surprised when you get to your appointment.  2) Contact Your Local Health Department Not all health departments have doctors that can see patients for sick visits, but many do, so it is worth a call to see if yours does. If you don't know where your local health department is, you can check in your phone book. The CDC also has a tool to help you locate your state's health department, and many state websites  also have listings of all of their local health departments.  3) Find a [facility] If your illness is not likely to be very severe or complicated, you may want to try a walk in clinic. These are popping up all over the country in pharmacies, drugstores, and shopping centers. They're usually staffed by nurse practitioners or physician assistants that have been trained to treat common illnesses and complaints. They're usually fairly quick and inexpensive. However, if you have serious medical issues or chronic medical problems, these are probably not your best option.  No Primary Care Doctor: - Call Health Connect at  [phone] - they can help you locate a primary care doctor that  accepts your insurance, provides certain services, etc. - Physician Referral Service- [phone]  Chronic Pain Problems: Organization         Address  Phone   Notes  [facility]  [phone] Patients need to be referred by their primary care doctor.   Medication Assistance: Organization         Address  Phone   Notes  [facility] Medication [facility] [place] [place], [place] [address] [phone] --Must be a resident of [facility] -- Must have NO insurance coverage whatsoever (no Medicaid/ Medicare, etc.) -- The pt. MUST have a primary care doctor that directs their care regularly and follows them in the community   MedAssist  [phone]   [employer]  [phone]    Agencies that provide inexpensive medical care: Organization         Address  Phone   Notes  [place]  [phone]   [facility]    [phone]   [facility] [place], [place] [address] [phone]   [place] [address], [place] [phone]   Planned Parenthood    [phone]   [facility]    [phone]   [place] and [place] [address], [place] Phone:  [phone], Fax:  [phone] Hours of Operation:  9 am - 6 pm, M-F.  Also accepts Medicaid/Medicare and self-pay.  [facility] for [place], [address], [place] Phone: [phone], Fax: [phone]. Hours of Operation:  8:30 am - 5:30 pm, M-F.  Also accepts Medicaid and self-pay.  [facility] [place] [address], [place] Phone: [phone]   [place], [place], [place] [phone], Ext. [phone] Mondays & Thursdays: 7-9 AM.  First 15 patients are seen on a first come, first serve basis.    [place] Providers:  Organization         Address  Phone   Notes  [facility] [address], [place] [phone] Also accepts self-pay patients.  [facility] [address], [place]  [phone]   [place], [address], [place] [phone]   [facility] [address] [place] [phone]   Lucianne Lei [address], [place]   [phone] Only accepts [place] Access [place] patients after they have their name applied to their card.   Self-Pay (no insurance) in [facility]:  Organization         Address  Phone   Notes  Sickle Cell Patients, [facility] [place] [phone]   [facility]gent Care [place] [phone]   [facility] [place]  [place], [place], [place] [phone]   [facility]/Dr. Osei-Bonsu  [address] [place] or [address], [place] [phone] Phone number for both [place] and [place] locations is the same.  Urgent Medical and [facility] [address] [place] [phone]   [facility] [address]  [place] or [address] [phone]   [facility] [address], [place] [phone], phone; [phone], fax Sees patients 1st and 3rd Saturday of every month.  Must not qualify for public or private insurance (i.e. Medicaid, Medicare, [place] Health Choice, Veterans' Benefits)  Household income should be no more than 200% of the poverty level The clinic cannot treat you if you are pregnant or think you are pregnant  Sexually transmitted diseases are not treated at the clinic.    Dental Care: Organization         Address  Phone  Notes  [facility] Department of [facility] [place] [phone] Accepts children up to age 48 who are enrolled in [place] or [place]; pregnant women with a Medicaid card; and children who have applied for Medicaid or [place] Health Choice, but were declined, whose parents can pay a reduced fee at time of service.  [facility] Department of [facility]  [address], [place] [phone] Accepts children up to age 93 who are enrolled in [place] or [place]; pregnant women with a Medicaid card; and children who have applied for Medicaid or [place] Health Choice, but were declined, whose parents can pay a reduced fee at time of service.  [facility] Adult [facility] Access PRO[facility]  [place] [phone] Patients are seen by appointment only. Walk-ins are not accepted. [place] will see patients 50 years of age and older. Monday - Tuesday (8am-5pm) Most Wednesdays (8:30-5pm) $30 per visit, cash only  [facility] Adult Dental Access PROGRAM  [address], [facility] [phone] Patients are seen by appointment only. Walk-ins are not accepted. [place] will see patients 47 years of age and older. One Wednesday Evening (Monthly: Volunteer Based).  $30 per visit, cash only  [place]  [phone] for adults; Children under age 28, call Graduate Pediatric Dentistry at [phone]. Children aged 70-14, please call [phone] to request a pediatric application.  Dental services are provided in all areas of dental care including fillings, crowns  and bridges, complete and partial dentures, implants, gum treatment, root canals, and extractions. Preventive care is also provided. Treatment is provided to both adults and children. Patients are selected via a lottery and there is often a waiting list.   [facility] [address], [place]  [phone] [email or web address]   [facility] [address] [place], [place] [phone], Ext. [phone] Second and Fourth Thursday of each month, opens at 6:30 AM; Clinic ends at 9 AM.  Patients are seen on a first-come first-served basis, and a limited number are seen during each clinic.   [facility]  [address] Hillard Danker [place], [place] [phone]   Eligibility Requirements You must have lived in [place], [place], or [place] counties for at least the last three months.   You cannot be eligible for state or federal sponsored [employer], including [employer], [place], or [employer].   You generally cannot be eligible for healthcare insurance through your employer.    How to apply: Eligibility screenings are held every Tuesday and Wednesday afternoon from 1:00 pm until 4:00 pm. You do not need an appointment for the interview!  [facility] [address] [place], [place]   [place]  [place] Department  [place]  [phone]    Behavioral Health Resources in the Community: Intensive Outpatient Programs Organization         Address  Phone  Notes  [place]. [address], [address], [place] [phone]     [facility] Outpatient [address], [place], [place]   ADS: Alcohol & Drug Svcs [address] [place], [place]   [place] [address]  [place], [place] or [phone]   Substance Abuse Resources Organization         Address  Phone  Notes  Alcohol and Drug Services  [phone]   [place]  [phone]   The [place]   Chinita Pester  [phone]   Residential & Outpatient Substance Abuse Program  [phone]   Psychological Services Organization         Address  Phone  Notes  [facility] [place]  [place]  [phone]   [place] [address], [place] or [phone]    Mobile Crisis Teams Organization         Address  Phone  Notes  [facility]rapeutic Alternatives, Mobile Crisis Care Unit  [phone]   [facility]  [address] [place], [place]   [place] [address], [place] [phone]    Self-Help/Support Groups Organization         Address  Phone             Notes  [place]. of [place] - variety of support groups  [place] Call for more information  Narcotics Anonymous (NA), Caring Services [address], [employer] [place]  2 meetings at this location   Special educational needs teacher         Address  Phone  Notes  ASAP Residential Treatment [place],    [place]  1-(929) [phone]   [facility]  [address] [place] [address], [place], [place]   [place], [place]  [phone] Admissions: 8am-3pm M-F  Incentives Substance [place] [address],    [place], [place] [phone]   [facility] [address] [place], [place], [place]   [facility] [address]  [place],  [place]   Insight Programs - Intensive Outpatient [address] Kristeen [address], [place], [place]   [facility] ([place].,  [place], [place] [phone] or [phone]   Residential Treatment Services (RTS) [address] [place], [place] Accepts Medicaid  [facility] [place] [address],  [place] [phone] Substance Abuse/Addiction Treatment   [facility] Organization         Address  Phone  Notes  [facility]  [phone]   Domenic Schwab, PhD [address] Arlis [place], [place]   [phone] or [phone]   [place], [place] [phone]   [facility] [address], [place], [place] [phone] Insurance/Medicaid/sponsorship through [facility] and Families [address]                                    [place], [place] [phone] [place] [address], [place] [phone]    Dr. Adele Schilder  [phone]   [facility] Dept. 1) [address] [place] 2) [place] 3)  [place] [address], [place] [phone]  [phone]   [place] [phone] or [phone] (After Hours)

## 2014-08-28 NOTE — ED Notes (Signed)
Patient asked to how much longer the wait would be and she wanted to leave room for a cigarette. I told patient that her provider had to see other patients. Patient raised voice nd was nasty at this answer. I told patient she needed to wait in room.

## 2014-08-29 LAB — HIV ANTIBODY (ROUTINE TESTING W REFLEX): HIV: NONREACTIVE

## 2014-09-01 ENCOUNTER — Emergency Department (HOSPITAL_COMMUNITY)
Admission: EM | Admit: 2014-09-01 | Discharge: 2014-09-01 | Disposition: A | Discharge status: Home / Self Care | Payer: No Typology Code available for payment source | Attending: Emergency Medicine | Admitting: Emergency Medicine

## 2014-09-01 ENCOUNTER — Encounter (HOSPITAL_COMMUNITY): Payer: Self-pay | Admitting: Emergency Medicine

## 2014-09-01 DIAGNOSIS — I10 Essential (primary) hypertension: Secondary | ICD-10-CM | POA: Insufficient documentation

## 2014-09-01 DIAGNOSIS — Z3202 Encounter for pregnancy test, result negative: Secondary | ICD-10-CM | POA: Insufficient documentation

## 2014-09-01 DIAGNOSIS — N946 Dysmenorrhea, unspecified: Secondary | ICD-10-CM | POA: Insufficient documentation

## 2014-09-01 DIAGNOSIS — Z79899 Other long term (current) drug therapy: Secondary | ICD-10-CM | POA: Insufficient documentation

## 2014-09-01 LAB — URINALYSIS, ROUTINE W REFLEX MICROSCOPIC
Glucose, UA: NEGATIVE mg/dL
KETONES UR: NEGATIVE mg/dL
LEUKOCYTES UA: NEGATIVE
Nitrite: NEGATIVE
Protein, ur: NEGATIVE mg/dL
SPECIFIC GRAVITY, URINE: 1.024 (ref 1.005–1.030)
Urobilinogen, UA: 1 mg/dL (ref 0.0–1.0)
pH: 6 (ref 5.0–8.0)

## 2014-09-01 LAB — I-STAT CHEM 8, ED
BUN: 6 mg/dL (ref 6–23)
CALCIUM ION: 1.17 mmol/L (ref 1.12–1.23)
Chloride: 108 mEq/L (ref 96–112)
Creatinine, Ser: 0.7 mg/dL (ref 0.50–1.10)
Glucose, Bld: 93 mg/dL (ref 70–99)
HCT: 40 % (ref 36.0–46.0)
Hemoglobin: 13.6 g/dL (ref 12.0–15.0)
Potassium: 4.1 mEq/L (ref 3.7–5.3)
Sodium: 139 mEq/L (ref 137–147)
TCO2: 20 mmol/L (ref 0–100)

## 2014-09-01 LAB — URINE MICROSCOPIC-ADD ON

## 2014-09-01 LAB — POC URINE PREG, ED: PREG TEST UR: NEGATIVE

## 2014-09-01 MED ORDER — KETOROLAC TROMETHAMINE 60 MG/2ML IM SOLN
60.0000 mg | Freq: Once | INTRAMUSCULAR | Status: AC
  Administered 2014-09-01: 60 mg via INTRAMUSCULAR
  Filled 2014-09-01: qty 2

## 2014-09-01 MED ORDER — NAPROXEN 375 MG PO TABS: 375.0000 mg | ORAL_TABLET | Freq: Two times a day (BID) | ORAL | Status: DC

## 2014-09-01 MED ORDER — TRAMADOL HCL 50 MG PO TABS: 50.0000 mg | ORAL_TABLET | Freq: Four times a day (QID) | ORAL | Status: DC | PRN

## 2014-09-01 MED ORDER — HYDROCODONE-ACETAMINOPHEN 5-325 MG PO TABS
2.0000 | ORAL_TABLET | Freq: Once | ORAL | Status: AC
  Administered 2014-09-01: 2 via ORAL
  Filled 2014-09-01: qty 2

## 2014-09-01 NOTE — ED Notes (Signed)
Pt c/o menstrual cramps that have been going on for 4-5 days now. Pt states that last night she was doubled over with pain. Pt states that she couldn't go to work today due to the pain.

## 2014-09-01 NOTE — ED Provider Notes (Signed)
CSN: [id number]     Arrival date & time 09/01/14  0900 History   First MD Initiated Contact with Patient 09/01/14 1000     Chief Complaint  Patient presents with  . Dysmenorrhea     (Consider location/radiation/quality/duration/timing/severity/associated sxs/prior Treatment) Patient is a 48 y.o. female presenting with abdominal pain. The history is provided by the patient. No language interpreter was used.  Abdominal Pain Pain location:  Suprapubic, LLQ and RLQ Pain quality: cramping   Pain radiates to:  Does not radiate Pain severity:  Severe Duration:  4 days Timing:  Intermittent Progression:  Waxing and waning Chronicity:  Chronic Context comment:  Related timing to menstrual cycle Relieved by: ibuprofen, tramodol. Worsened by:  Nothing tried Ineffective treatments:  None tried Associated symptoms: vaginal bleeding   Associated symptoms: no anorexia, no belching, no chest pain, no chills, no constipation, no cough, no diarrhea, no dysuria, no fatigue, no fever, no hematuria, no nausea, no shortness of breath, no sore throat and no vomiting   Vaginal bleeding:    Quality:  Typical of menses   Onset quality: today.   Progression:  Unchanged Risk factors: no alcohol abuse, no aspirin use, not elderly, has not had multiple surgeries, no NSAID use, not obese, not pregnant and no recent hospitalization     Past Medical History  Diagnosis Date  . Hypertension   . Dysmenorrhea    Past Surgical History  Procedure Laterality Date  . Gallbladder surgery  2011  . Hand surgery  20 years ago    surgery due to infection per pt.   Family History  Problem Relation Age of Onset  . Adopted: Yes   History  Substance Use Topics  . Smoking status: Never Smoker   . Smokeless tobacco: Not on file  . Alcohol Use: No     Comment: LAST TIME-   2 MTHS AGO   OB History    Gravida Para Term Preterm AB TAB SAB Ectopic Multiple Living   2    2  2         Review of Systems   Constitutional: Negative for fever, chills, diaphoresis, activity change, appetite change and fatigue.  HENT: Negative for congestion, facial swelling, rhinorrhea and sore throat.   Eyes: Negative for photophobia and discharge.  Respiratory: Negative for cough, chest tightness and shortness of breath.   Cardiovascular: Negative for chest pain, palpitations and leg swelling.  Gastrointestinal: Positive for abdominal pain. Negative for nausea, vomiting, diarrhea, constipation and anorexia.  Endocrine: Negative for polydipsia and polyuria.  Genitourinary: Positive for vaginal bleeding and menstrual problem. Negative for dysuria, frequency, hematuria, difficulty urinating and pelvic pain.  Musculoskeletal: Negative for back pain, arthralgias, neck pain and neck stiffness.  Skin: Negative for color change and wound.  Allergic/Immunologic: Negative for immunocompromised state.  Neurological: Negative for facial asymmetry, weakness, numbness and headaches.  Hematological: Does not bruise/bleed easily.  Psychiatric/Behavioral: Negative for confusion and agitation.      Allergies  Ciprofloxacin  Home Medications   Prior to Admission medications   Medication Sig Start Date End Date Taking? Authorizing Provider  cloNIDine (CATAPRES) 0.2 MG tablet Take 1 tablet (0.2 mg total) by mouth 2 (two) times daily. Patient taking differently: Take 0.2 mg by mouth 4 (four) times daily.  08/25/14  Yes Kaitlyn Szekalski, PA-C  ibuprofen (ADVIL,MOTRIN) 800 MG tablet Take 800 mg by mouth daily as needed for moderate pain.  07/12/14  Yes Historical Provider, MD  naproxen (NAPROSYN) 375 MG tablet Take 1  tablet (375 mg total) by mouth 2 (two) times daily with a meal. 09/01/14   Ernestina Patches, MD  traMADol (ULTRAM) 50 MG tablet Take 1 tablet (50 mg total) by mouth every 6 (six) hours as needed. 09/01/14   Ernestina Patches, MD   BP 154/84 mmHg  Pulse 71  Temp(Src) 97.6 F (36.4 C) (Oral)  Resp 16  SpO2 100%  LMP  08/28/2014 Physical Exam  Constitutional: She is oriented to person, place, and time. She appears well-developed and well-nourished. No distress.  HENT:  Head: Normocephalic and atraumatic.  Mouth/Throat: No oropharyngeal exudate.  Eyes: Pupils are equal, round, and reactive to light.  Neck: Normal range of motion. Neck supple.  Cardiovascular: Normal rate, regular rhythm and normal heart sounds.  Exam reveals no gallop and no friction rub.   No murmur heard. Pulmonary/Chest: Effort normal and breath sounds normal. No respiratory distress. She has no wheezes. She has no rales.  Abdominal: Soft. Bowel sounds are normal. She exhibits no distension and no mass. There is tenderness in the right lower quadrant, suprapubic area and left lower quadrant. There is no rigidity, no rebound and no guarding.  Musculoskeletal: Normal range of motion. She exhibits no edema or tenderness.  Neurological: She is alert and oriented to person, place, and time.  Skin: Skin is warm and dry.  Psychiatric: She has a normal mood and affect.    ED Course  Procedures (including critical care time) Labs Review Labs Reviewed  URINALYSIS, ROUTINE W REFLEX MICROSCOPIC - Abnormal; Notable for the following:    Color, Urine AMBER (*)    APPearance TURBID (*)    Hgb urine dipstick LARGE (*)    Bilirubin Urine SMALL (*)    All other components within normal limits  URINE MICROSCOPIC-ADD ON - Abnormal; Notable for the following:    Squamous Epithelial / LPF MANY (*)    Bacteria, UA FEW (*)    All other components within normal limits  URINE CULTURE  POC URINE PREG, ED  I-STAT CHEM 8, ED    Imaging Review No results found.   EKG Interpretation None      MDM   Final diagnoses:  Dysmenorrhea    Pt is a 48 y.o. female with Pmhx as above who presents with for 5 days with continued low abdominal cramping pain that she states is consistent with prior menstrual cramps that was worse today. She was seen for  same on 08/28/2014 and a refill of tramadol was given. Of note she has had 19 ED visits in the past 6 months.  She states she has been taking the tramadol which is only mildly helped. She states that she has an appointment with women's clinic 4 days from now. She is eager to avoid imaging or laboratory charges if able. I have offered her a pelvic exam and a repeat transvaginal ultrasound which she has declined. I will get a urine, and i-STAT chem 8 and treat her pain. If she does not feel improved we will rediscuss further workup. She has had a pelvic exam less than 1 month ago on 10/17.   Urine does not appear infected, i-STAT chem 8 is unremarkable.patient feeling much improved after by mouth narcotic and IM Toradol. Patient will be discharged can follow up as planned with gynecology later this week. Prescription for tramadol and Aleve given. Return precautions given for new worsening symptoms including worsening pain, fever, inability to tolerate by mouth.      Ernestina Patches, MD 09/01/14  1608 

## 2014-09-01 NOTE — ED Notes (Signed)
Pt escorted to discharge window. Pt verbalized understanding discharge instructions. In no acute distress.  

## 2014-09-01 NOTE — Discharge Instructions (Signed)

## 2014-09-01 NOTE — ED Notes (Signed)
Pt alert and oriented x4. Respirations even and unlabored, bilateral symmetrical rise and fall of chest. Skin warm and dry. In no acute distress. Denies needs.   

## 2014-09-01 NOTE — ED Notes (Signed)
Pt refused plevic notified doctor [place]

## 2014-09-03 LAB — URINE CULTURE: Special Requests: NORMAL

## 2014-09-21 ENCOUNTER — Encounter (HOSPITAL_COMMUNITY): Payer: Self-pay | Admitting: Emergency Medicine

## 2014-09-21 ENCOUNTER — Emergency Department (HOSPITAL_COMMUNITY)
Admission: EM | Admit: 2014-09-21 | Discharge: 2014-09-21 | Disposition: A | Discharge status: Home / Self Care | Payer: No Typology Code available for payment source | Attending: Emergency Medicine | Admitting: Emergency Medicine

## 2014-09-21 DIAGNOSIS — Z760 Encounter for issue of repeat prescription: Secondary | ICD-10-CM | POA: Insufficient documentation

## 2014-09-21 DIAGNOSIS — Z791 Long term (current) use of non-steroidal anti-inflammatories (NSAID): Secondary | ICD-10-CM | POA: Insufficient documentation

## 2014-09-21 DIAGNOSIS — N946 Dysmenorrhea, unspecified: Secondary | ICD-10-CM | POA: Insufficient documentation

## 2014-09-21 DIAGNOSIS — I10 Essential (primary) hypertension: Secondary | ICD-10-CM | POA: Insufficient documentation

## 2014-09-21 DIAGNOSIS — Z9889 Other specified postprocedural states: Secondary | ICD-10-CM | POA: Insufficient documentation

## 2014-09-21 LAB — I-STAT CHEM 8, ED
BUN: 10 mg/dL (ref 6–23)
Calcium, Ion: 1.12 mmol/L (ref 1.12–1.23)
Chloride: 110 mEq/L (ref 96–112)
Creatinine, Ser: 0.6 mg/dL (ref 0.50–1.10)
Glucose, Bld: 95 mg/dL (ref 70–99)
HCT: 42 % (ref 36.0–46.0)
HEMOGLOBIN: 14.3 g/dL (ref 12.0–15.0)
Potassium: 4.1 mEq/L (ref 3.7–5.3)
Sodium: 138 mEq/L (ref 137–147)
TCO2: 17 mmol/L (ref 0–100)

## 2014-09-21 NOTE — Discharge Instructions (Signed)
If you continue to take the ibuprofen in excess he will do damage to your kidneys and may need dialysis. Stopped taking ibuprofen immediately and drink plenty of water.    Do not hesitate to return to the emergency room for any new, worsening or concerning symptoms.  Please obtain primary care using resource guide below. But the minute you were seen in the emergency room and that they will need to obtain records for further outpatient management.   Emergency Department Resource Guide 1) Find a Doctor and Pay Out of Pocket Although you won't have to find out who is covered by your insurance plan, it is a good idea to ask around and get recommendations. You will then need to call the office and see if the doctor you have chosen will accept you as a new patient and what types of options they offer for patients who are self-pay. Some doctors offer discounts or will set up payment plans for their patients who do not have insurance, but you will need to ask so you aren't surprised when you get to your appointment.  2) Contact Your Local Health Department Not all health departments have doctors that can see patients for sick visits, but many do, so it is worth a call to see if yours does. If you don't know where your local health department is, you can check in your phone book. The CDC also has a tool to help you locate your state's health department, and many state websites also have listings of all of their local health departments.  3) Find a [facility] If your illness is not likely to be very severe or complicated, you may want to try a walk in clinic. These are popping up all over the country in pharmacies, drugstores, and shopping centers. They're usually staffed by nurse practitioners or physician assistants that have been trained to treat common illnesses and complaints. They're usually fairly quick and inexpensive. However, if you have serious medical issues or chronic medical problems, these  are probably not your best option.  No Primary Care Doctor: - Call Health Connect at  [phone] - they can help you locate a primary care doctor that  accepts your insurance, provides certain services, etc. - Physician Referral Service- [phone]  Chronic Pain Problems: Organization         Address  Phone   Notes  [facility]  [phone] Patients need to be referred by their primary care doctor.   Medication Assistance: Organization         Address  Phone   Notes  [facility] Medication [facility] [address] [place], [place] [address] [phone] --Must be a resident of [facility] -- Must have NO insurance coverage whatsoever (no Medicaid/ Medicare, etc.) -- The pt. MUST have a primary care doctor that directs their care regularly and follows them in the community   [facility]ist  [phone]   [employer]  [phone]    Agencies that provide inexpensive medical care: Organization         Address  Phone   Notes  [place]  [phone]   [facility]    [phone]   [facility] [place], [place] [address] [phone]   [place] [address], [place] [phone]   [facility]anned Parenthood    [phone]   [facility]    [phone]   [place] [address], [place] Phone:  [phone], Fax:  [phone] Hours of Operation:  9 am - 6 pm, M-F.  Also accepts Medicaid/Medicare and self-pay.  [facility] for [place], [address], [place] Phone: [phone], Fax: [phone]. Hours of Operation:  8:30 am - 5:30 pm, M-F.  Also accepts Medicaid and self-pay.  [facility] [place] [address] [place] Phone: [phone]   [place], [place], [place] [phone], Ext. [phone] Mondays &  Thursdays: 7-9 AM.  First 15 patients are seen on a first come, first serve basis.    [place] Providers:  Organization         Address  Phone   Notes  [facility] [address], [place] [phone] Also accepts self-pay patients.  [facility] [address], [place]  [phone]   [place], [address], [place] [phone]   [facility] [address], [place] [phone]   Lucianne Lei [address], [place]   [phone] Only accepts [place] Access [place] patients after they have their name applied to their card.   Self-Pay (no insurance) in [facility]:  Organization         Address  Phone   Notes  Sickle Cell Patients, [facility] [place] [phone]   [facility] [place] [phone]   [facility] [place]  [place], [place], [place] [phone]   [facility]/Dr. Osei-Bonsu  [address] [place] or [address], [place] [phone] Phone number for both [place] and [place] locations is the same.  [facility] [address] [place] [phone]   [facility] [address] [place] or [address] [phone]   [facility] [address] [place] [phone], phone; [phone], fax Sees patients 1st and 3rd Saturday of every month.  Must not qualify for public or private insurance (i.e. Medicaid, Medicare, [place] Health Choice, Veterans' Benefits)  Household income should be no more than 200% of the poverty level The clinic cannot treat you if you are pregnant or think you are pregnant  Sexually transmitted diseases are not treated at the  clinic.    Dental Care: Organization         Address  Phone  Notes  [facility] [place] [address] Department of [facility] [place] [phone] Accepts children up to age 51 who are enrolled in [place] or [place]; pregnant women with a Medicaid card; and children who have applied for Medicaid or [place] Health Choice, but were declined, whose parents can pay a reduced fee at time of service.  [facility] Department of [facility]  [address], [place] [phone] Accepts children up to age 79 who are enrolled in [place] or [place]; pregnant women with a Medicaid card; and children who have applied for Medicaid or [place] Health Choice, but were declined, whose parents can pay a reduced fee at time of service.  [place]  Access PROGRAM  [place] [phone] Patients are seen by appointment only. Walk-ins are not accepted. [place] will see patients 18 years of age and older. Monday - Tuesday (8am-5pm) Most Wednesdays (8:30-5pm) $30 per visit, cash only  [facility] Adult Dental Access PROGRAM  [address], [facility] [phone] Patients are seen by appointment only. Walk-ins are not accepted. [place] will see patients 66 years of age and older. One Wednesday Evening (Monthly: Volunteer Based).  $30 per visit, cash only  [place]  [phone] for adults; Children under age 79, call Graduate Pediatric Dentistry at [phone]. Children aged 46-14, please call [phone] to request a pediatric application.  Dental services are provided in all areas of dental care including fillings, crowns and bridges, complete and partial dentures, implants, gum treatment, root canals, and extractions. Preventive care is also provided. Treatment is provided to both adults and children. Patients are selected via a lottery and there is often a  waiting list.   [facility] [address] [place]  [phone] [email or web address]   [facility] [address] [place], [place] [phone], Ext. [phone] Second and Fourth Thursday of each month, opens at 6:30 AM; Clinic ends at 9 AM.  Patients are seen on a first-come first-served basis, and a limited number are seen during each clinic.   [facility]  [address] Hillard Danker [place], [place] [phone]   Eligibility Requirements You must have lived in [place], [place], or [place] counties for at least the last three months.   You cannot be eligible for state or federal sponsored [employer], including [employer], [place], or [employer].   You generally cannot be eligible for healthcare insurance through your employer.    How to apply: Eligibility screenings are held every Tuesday and Wednesday afternoon from 1:00 pm until 4:00 pm. You do not need an appointment for the interview!  [facility] [address] [place], [place]   [place]  [place] Department  [place]  [phone]    Behavioral Health Resources in the Community: Intensive Outpatient Programs Organization         Address  Phone  Notes  [place]. [address], [place], [place] [phone]   [facility] [address] [place], [place]   ADS: Alcohol & Drug S[facility]cs [address], [place], [place]   [place] [address]  [place], [place] or [phone]   Substance Abuse Resources Organization         Address  Phone  Notes  Alcohol and Drug Services  [phone]   [place]  [phone]   [facility] [place]   Chinita Pester  [phone]   Residential & Outpatient Substance Abuse  Program  [phone]   Psychological Services Organization         Address  Phone  Notes  [facility] [place]  [place]  [phone]   [place] [address] [place] or [phone]    Mobile Crisis Teams Organization         Address  Phone  Notes  [facility], Mobile Crisis Care Unit  [phone]   [facility]  [address] [place],  [place] [address] [place] [phone]    Self-Help/Support Groups Organization         Address  Phone             Notes  Mental Health Assoc. of [place] - variety of support groups  [place] Call for more information  Narcotics Anonymous (NA), [facility] [address], [employer] [place]  2 meetings at this location   Special educational needs teacher         Address  Phone  Notes  [facility] Residential Treatment [place],    [place]  [phone])[phone]   [facility]  [address] [place] [address]902, [place], [place]   [place], [place] [phone] Admissions: 8am-3pm M-F  Incentives Substance [place] [address]    [place], [place] [phone]   [facility] [address] [place], [place], [place]   [facility] [address]  [place], [place]   [facility] Programs - Intensive Outpatient [address], [place], [place]   [facility] ([place] [place].,  [place], [place] [phone] or [phone]   [facility] (RTS) [address] [place], [place] Accepts Medicaid  [facility] [place] [address],  [place] [phone] Substance Abuse/Addiction Treatment   [facility] Organization          Address  Phone  Notes  [facility]  [phone]   Domenic Schwab, PhD [address] Arlis Porta [place], [place]   [phone] or [phone]   [place] Celina Evan [place], [place] [phone]   [facility] [address], [place], [place] [phone] Insurance/Medicaid/sponsorship through [facility] and Families [address], [address] [place]                                    [place], [place] [phone] [place] [address] [place], [place] [phone]    Dr. Adele Schilder  [phone]   [facility] Dept. 1) [address], [place] 2) [place] 3)  [place] [address], [place] [phone]  [phone]   [place] [phone] or [phone] (After Hours)

## 2014-09-21 NOTE — ED Provider Notes (Signed)
CSN: [id number]     Arrival date & time 09/21/14  1051 History   First MD Initiated Contact with Patient 09/21/14 1202     Chief Complaint  Patient presents with  . Ibuprofen refill      (Consider location/radiation/quality/duration/timing/severity/associated sxs/prior Treatment) HPI   Regina Estes is a 48 y.o. female with past medical history significant for hypertension dysmenorrhea requesting refill of 800 mg Motrin for menstrual cramping which she has had for the last 3 days. Patient states that she is taken one and then changes to 2 full bottles of Motrin over the course of the last 3 days. States that she normally takes to 800 mg Motrin 3 times a day. She denies dysuria, hematuria, nausea, vomiting, chest pain, shortness of breath, dizziness, change in bowel habits. She rates her pain at 10 out of 10, cannot identify any exacerbating or alleviating factors.   PCP Regina Estes  Past Medical History  Diagnosis Date  . Hypertension   . Dysmenorrhea    Past Surgical History  Procedure Laterality Date  . Gallbladder surgery  2011  . Hand surgery  20 years ago    surgery due to infection per pt.   Family History  Problem Relation Age of Onset  . Adopted: Yes   History  Substance Use Topics  . Smoking status: Never Smoker   . Smokeless tobacco: Not on file  . Alcohol Use: No     Comment: LAST TIME-   2 MTHS AGO   OB History    Gravida Para Term Preterm AB TAB SAB Ectopic Multiple Living   2    2  2         Review of Systems  10 systems reviewed and found to be negative, except as noted in the HPI.   Allergies  Ciprofloxacin  Home Medications   Prior to Admission medications   Medication Sig Start Date End Date Taking? Authorizing Provider  cloNIDine (CATAPRES) 0.2 MG tablet Take 1 tablet (0.2 mg total) by mouth 2 (two) times daily. Patient taking differently: Take 0.2 mg by mouth 4 (four) times daily.  08/25/14   Regina Szekalski, PA-C  ibuprofen  (ADVIL,MOTRIN) 800 MG tablet Take 800 mg by mouth daily as needed for moderate pain.  07/12/14   Historical Provider, MD  naproxen (NAPROSYN) 375 MG tablet Take 1 tablet (375 mg total) by mouth 2 (two) times daily with a meal. 09/01/14   Regina Patches, MD  traMADol (ULTRAM) 50 MG tablet Take 1 tablet (50 mg total) by mouth every 6 (six) hours as needed. 09/01/14   Regina Patches, MD   BP 112/82 mmHg  Pulse 100  Temp(Src) 97.8 F (36.6 C) (Oral)  Resp 18  SpO2 100%  LMP 08/28/2014 Physical Exam  Constitutional: She is oriented to person, place, and time. She appears well-developed and well-nourished. No distress.  HENT:  Head: Normocephalic and atraumatic.  Mouth/Throat: Oropharynx is clear and moist.  Eyes: Conjunctivae and EOM are normal. Pupils are equal, round, and reactive to light.  Neck: Normal range of motion.  Cardiovascular: Normal rate, regular rhythm and intact distal pulses.   Pulmonary/Chest: Effort normal and breath sounds normal. No stridor. No respiratory distress. She has no wheezes. She has no rales.  Abdominal: Soft. Bowel sounds are normal. She exhibits no mass. There is tenderness. There is no rebound and no guarding.  Mild, diffuse tenderness to palpation with no guarding or rebound.  Murphy sign negative, no tenderness to palpation over McBurney's point,  Rovsings, Psoas and obturator all negative.   Musculoskeletal: Normal range of motion.  Neurological: She is alert and oriented to person, place, and time.  Psychiatric: She has a normal mood and affect.  Nursing note and vitals reviewed.   ED Course  Procedures (including critical care time) Labs Review Labs Reviewed  I-STAT CHEM 8, ED    Imaging Review No results found.   EKG Interpretation None      MDM   Final diagnoses:  Menstrual cramps    Filed Vitals:   09/21/14 1128  BP: 112/82  Pulse: 100  Temp: 97.8 F (36.6 C)  TempSrc: Oral  Resp: 18  SpO2: 100%    Regina Estes is a 48  y.o. female presenting with pain from dysmenorrhea. Abdominal exam is nonsurgical. Of note patient has had 17 visits in the last 6 months for similar complaints. Patient states that she is taking ibuprofen to excess. I have advised her that this is dangerous to her kidney function and she may need permanent dialysis if she continues along this vein. Advised the patient that she must stop taking all NSAIDs and drink plenty of fluids. I-STAT chem 8 shows normal renal function. I've advised the patient that she will need to follow with her primary care physician for management of her chronic dysmenorrhea. Patient states that she was told she is welcome to return to the ED for medication refills as needed. I've explained to her that we are happy to see her at any time to do a medical screening exam but that there is no indication to refill medications if there is a nonemergent condition. Patient has verbalized her understanding. She is requesting Ultram which I have declined. I have offered her acetaminophen which she declined.  Evaluation does not show pathology that would require ongoing emergent intervention or inpatient treatment. Pt is hemodynamically stable and mentating appropriately. Discussed findings and plan with patient/guardian, who agrees with care plan. All questions answered. Return precautions discussed and outpatient follow up given.        Monico Blitz, PA-C 09/21/14 [place], MD 09/22/14 [phone]

## 2014-09-21 NOTE — ED Notes (Signed)
Pt states that she needs a refill of 800 mg ibuprofen.  Takes 1600 mg of ibuprofen 3 times a day.  States it for her menstrual cramps.

## 2014-09-22 ENCOUNTER — Emergency Department (HOSPITAL_COMMUNITY)
Admission: EM | Admit: 2014-09-22 | Discharge: 2014-09-23 | Disposition: A | Discharge status: Home / Self Care | Payer: No Typology Code available for payment source | Attending: Emergency Medicine | Admitting: Emergency Medicine

## 2014-09-22 ENCOUNTER — Encounter (HOSPITAL_COMMUNITY): Payer: Self-pay

## 2014-09-22 DIAGNOSIS — N946 Dysmenorrhea, unspecified: Secondary | ICD-10-CM | POA: Insufficient documentation

## 2014-09-22 DIAGNOSIS — I10 Essential (primary) hypertension: Secondary | ICD-10-CM | POA: Insufficient documentation

## 2014-09-22 DIAGNOSIS — Z791 Long term (current) use of non-steroidal anti-inflammatories (NSAID): Secondary | ICD-10-CM | POA: Insufficient documentation

## 2014-09-22 DIAGNOSIS — N390 Urinary tract infection, site not specified: Secondary | ICD-10-CM | POA: Insufficient documentation

## 2014-09-22 DIAGNOSIS — Z3202 Encounter for pregnancy test, result negative: Secondary | ICD-10-CM | POA: Insufficient documentation

## 2014-09-22 DIAGNOSIS — Z79899 Other long term (current) drug therapy: Secondary | ICD-10-CM | POA: Insufficient documentation

## 2014-09-22 MED ORDER — TRAMADOL HCL 50 MG PO TABS
50.0000 mg | ORAL_TABLET | Freq: Once | ORAL | Status: AC
  Administered 2014-09-23: 50 mg via ORAL
  Filled 2014-09-22: qty 1

## 2014-09-22 NOTE — ED Notes (Signed)
Pt presents with c/o menstrual cramps. Pt reports that she was seen yesterday for same, needs a refill for her 800mg  ibuprofen. Pt reports she has had these cramps since yesterday. Pt says she "doesn't know what to do".

## 2014-09-22 NOTE — ED Provider Notes (Signed)
CSN: [id number]     Arrival date & time 09/22/14  1738 History   First MD Initiated Contact with Patient 09/22/14 2256     Chief Complaint  Patient presents with  . Abdominal Pain     (Consider location/radiation/quality/duration/timing/severity/associated sxs/prior Treatment) HPI Comments: The patient is a 48 year old female who has frequent visits to the emergency department for pain complaints, usually requests ibuprofen secondary to her menstrual cramps. She states that this happens once or twice a month, she has had chronic irregular and very painful menstrual cycles, she has already seen a gynecologist who has planned on placing an IUD as an outpatient to help with controlling hormones and hopefully menstrual cycles. The patient denies any vaginal bleeding at this time, she expects to start tomorrow, denies dysuria, hematuria, urinary frequency, diarrhea or rectal bleeding. She states that this pain is the same pain that she has almost once or twice a month for the last year. She does not have any establish relationships with community physicians to prescribe her pain medications. She states that she cannot take traditional narcotic medications and take so much ibuprofen that makes her stomach hurt sometimes.  Review of the blood work from yesterday showed a normal metabolic panel. She was seen for the same and apopropriately referred on NSAID therapy.  Patient is a 48 y.o. female presenting with abdominal pain. The history is provided by the patient and medical records.  Abdominal Pain   Past Medical History  Diagnosis Date  . Hypertension   . Dysmenorrhea    Past Surgical History  Procedure Laterality Date  . Gallbladder surgery  2011  . Hand surgery  20 years ago    surgery due to infection per pt.   Family History  Problem Relation Age of Onset  . Adopted: Yes   History  Substance Use Topics  . Smoking status: Never Smoker   . Smokeless tobacco: Not on file  . Alcohol  Use: No     Comment: LAST TIME-   2 MTHS AGO   OB History    Gravida Para Term Preterm AB TAB SAB Ectopic Multiple Living   2    2  2         Review of Systems  Gastrointestinal: Positive for abdominal pain.  All other systems reviewed and are negative.     Allergies  Ciprofloxacin  Home Medications   Prior to Admission medications   Medication Sig Start Date End Date Taking? Authorizing Provider  cloNIDine (CATAPRES) 0.2 MG tablet Take 1 tablet (0.2 mg total) by mouth 2 (two) times daily. Patient taking differently: Take 0.2 mg by mouth 4 (four) times daily.  08/25/14  Yes Kaitlyn Szekalski, PA-C  ibuprofen (ADVIL,MOTRIN) 200 MG tablet Take 800 mg by mouth every 6 (six) hours as needed for moderate pain.   Yes Historical Provider, MD  ibuprofen (ADVIL,MOTRIN) 800 MG tablet Take 800 mg by mouth daily as needed for moderate pain.  07/12/14   Historical Provider, MD  naproxen (NAPROSYN) 375 MG tablet Take 1 tablet (375 mg total) by mouth 2 (two) times daily with a meal. Patient not taking: Reported on 09/22/2014 09/01/14   Ernestina Patches, MD  sulfamethoxazole-trimethoprim (SEPTRA DS) 800-160 MG per tablet Take 1 tablet by mouth every 12 (twelve) hours. 09/23/14   Johnna Acosta, MD  traMADol (ULTRAM) 50 MG tablet Take 1 tablet (50 mg total) by mouth every 6 (six) hours as needed. 09/23/14   Johnna Acosta, MD   BP 115/70  mmHg  Pulse 91  Temp(Src) 98.6 F (37 C) (Oral)  Resp 16  SpO2 100%  LMP 08/28/2014 Physical Exam  Constitutional: She appears well-developed and well-nourished. No distress.  HENT:  Head: Normocephalic and atraumatic.  Mouth/Throat: Oropharynx is clear and moist. No oropharyngeal exudate.  Eyes: Conjunctivae and EOM are normal. Pupils are equal, round, and reactive to light. Right eye exhibits no discharge. Left eye exhibits no discharge. No scleral icterus.  Neck: Normal range of motion. Neck supple. No JVD present. No thyromegaly present.  Cardiovascular:  Normal rate, regular rhythm, normal heart sounds and intact distal pulses.  Exam reveals no gallop and no friction rub.   No murmur heard. Pulmonary/Chest: Effort normal and breath sounds normal. No respiratory distress. She has no wheezes. She has no rales.  Abdominal: Soft. Bowel sounds are normal. She exhibits no distension and no mass. There is tenderness ( No tympanitic sounds to percussion, no guarding, no distention, mild tenderness in the suprapubic area).  Musculoskeletal: Normal range of motion. She exhibits no edema or tenderness.  Lymphadenopathy:    She has no cervical adenopathy.  Neurological: She is alert. Coordination normal.  Skin: Skin is warm and dry. No rash noted. No erythema.  Psychiatric: She has a normal mood and affect. Her behavior is normal.  Nursing note and vitals reviewed.   ED Course  Procedures (including critical care time) Labs Review Labs Reviewed  URINALYSIS, ROUTINE W REFLEX MICROSCOPIC - Abnormal; Notable for the following:    Color, Urine AMBER (*)    APPearance TURBID (*)    Specific Gravity, Urine 1.035 (*)    Bilirubin Urine SMALL (*)    Leukocytes, UA SMALL (*)    All other components within normal limits  URINE MICROSCOPIC-ADD ON - Abnormal; Notable for the following:    Squamous Epithelial / LPF MANY (*)    Bacteria, UA MANY (*)    Crystals CA OXALATE CRYSTALS (*)    All other components within normal limits  URINE CULTURE  PREGNANCY, URINE    Imaging Review No results found.    MDM   Final diagnoses:  Menstrual cramps  UTI (lower urinary tract infection)    The patient had a very benign exam, she is not tachycardic for me at this time, her abdomen is minimally tender in the suprapubic region, check urinalysis, urine pregnancy, small prescription for tramadol, I informed the patient that she would no longer receive pain medication prescriptions from the hospital and would need to follow up with a community physician for  ongoing chronic pain control. She is in agreement with this plan. I have also asked her to start Pepcid daily to help prevent stomach ulcers given her excessive use of anti-inflammatories.  Urinalysis will require culture, many bacteria, some calcium oxalate crystals, negative nitrate, negative pregnancy. Medications given as below   Meds given in ED:  Medications  traMADol (ULTRAM) tablet 50 mg (50 mg Oral Given 09/23/14 0010)    New Prescriptions   SULFAMETHOXAZOLE-TRIMETHOPRIM (SEPTRA DS) 800-160 MG PER TABLET    Take 1 tablet by mouth every 12 (twelve) hours.   TRAMADOL (ULTRAM) 50 MG TABLET    Take 1 tablet (50 mg total) by mouth every 6 (six) hours as needed.      Johnna Acosta, MD 09/23/14 [phone]

## 2014-09-23 LAB — PREGNANCY, URINE: Preg Test, Ur: NEGATIVE

## 2014-09-23 LAB — URINALYSIS, ROUTINE W REFLEX MICROSCOPIC
Glucose, UA: NEGATIVE mg/dL
HGB URINE DIPSTICK: NEGATIVE
Ketones, ur: NEGATIVE mg/dL
Nitrite: NEGATIVE
PROTEIN: NEGATIVE mg/dL
Specific Gravity, Urine: 1.035 — ABNORMAL HIGH (ref 1.005–1.030)
UROBILINOGEN UA: 1 mg/dL (ref 0.0–1.0)
pH: 6.5 (ref 5.0–8.0)

## 2014-09-23 LAB — URINE MICROSCOPIC-ADD ON

## 2014-09-23 MED ORDER — SULFAMETHOXAZOLE-TRIMETHOPRIM 800-160 MG PO TABS: 1.0000 | ORAL_TABLET | Freq: Two times a day (BID) | ORAL | Status: DC

## 2014-09-23 MED ORDER — TRAMADOL HCL 50 MG PO TABS: 50.0000 mg | ORAL_TABLET | Freq: Four times a day (QID) | ORAL | Status: DC | PRN

## 2014-09-23 NOTE — ED Notes (Signed)
Pt refused to have discharge vitals performed

## 2014-09-24 LAB — URINE CULTURE: Colony Count: 45000

## 2014-09-25 ENCOUNTER — Encounter (HOSPITAL_COMMUNITY): Payer: Self-pay | Admitting: Emergency Medicine

## 2014-09-25 ENCOUNTER — Emergency Department (INDEPENDENT_AMBULATORY_CARE_PROVIDER_SITE_OTHER)
Admission: EM | Admit: 2014-09-25 | Discharge: 2014-09-25 | Disposition: A | Discharge status: Home / Self Care | Payer: Self-pay | Source: Home / Self Care | Attending: Family Medicine | Admitting: Family Medicine

## 2014-09-25 DIAGNOSIS — N390 Urinary tract infection, site not specified: Secondary | ICD-10-CM

## 2014-09-25 DIAGNOSIS — R102 Pelvic and perineal pain: Secondary | ICD-10-CM

## 2014-09-25 LAB — POCT URINALYSIS DIP (DEVICE)
Glucose, UA: NEGATIVE mg/dL
Ketones, ur: NEGATIVE mg/dL
Nitrite: NEGATIVE
PROTEIN: 30 mg/dL — AB
Specific Gravity, Urine: >=1.03 (ref 1.005–1.030)
UROBILINOGEN UA: 1 mg/dL (ref 0.0–1.0)
pH: 6 (ref 5.0–8.0)

## 2014-09-25 MED ORDER — TRAMADOL HCL 50 MG PO TABS: 50.0000 mg | ORAL_TABLET | Freq: Four times a day (QID) | ORAL | Status: DC | PRN

## 2014-09-25 MED ORDER — CEPHALEXIN 500 MG PO CAPS: 500.0000 mg | ORAL_CAPSULE | Freq: Three times a day (TID) | ORAL | Status: DC

## 2014-09-25 NOTE — ED Provider Notes (Addendum)
Regina Estes is a 48 y.o. female who presents to Urgent Care today for pelvic pain and urinary tract infection. Patient has a history of chronic pelvic pain. She has excessive menstruation and is scheduled for a Mirena IUD placement later this month. She's been to the emergency room multiple times for this issue. She takes up to 8 tablets of 800 mg of ibuprofen daily for pain. Additionally she intermittently uses tramadol. She was seen in the emergency department 4 days prior and diagnosed with a urinary tract infection. She was given Bactrim which she took one dose of and developed stomach upset and stopped. She used up all of her tramadol. No fevers or chills vomiting or diarrhea. She continues to have urinary frequency and urgency and complains of unchanged chronic severe pelvic pain.   Past Medical History  Diagnosis Date  . Hypertension   . Dysmenorrhea    Past Surgical History  Procedure Laterality Date  . Gallbladder surgery  2011  . Hand surgery  20 years ago    surgery due to infection per pt.   History  Substance Use Topics  . Smoking status: Never Smoker   . Smokeless tobacco: Not on file  . Alcohol Use: No     Comment: LAST TIME-   2 MTHS AGO   ROS as above Medications: No current facility-administered medications for this encounter.   Current Outpatient Prescriptions  Medication Sig Dispense Refill  . cephALEXin (KEFLEX) 500 MG capsule Take 1 capsule (500 mg total) by mouth 3 (three) times daily. 21 capsule 0  . cloNIDine (CATAPRES) 0.2 MG tablet Take 1 tablet (0.2 mg total) by mouth 2 (two) times daily. (Patient taking differently: Take 0.2 mg by mouth 4 (four) times daily. ) 30 tablet 1  . ibuprofen (ADVIL,MOTRIN) 200 MG tablet Take 800 mg by mouth every 6 (six) hours as needed for moderate pain.    Marland Kitchen ibuprofen (ADVIL,MOTRIN) 800 MG tablet Take 800 mg by mouth daily as needed for moderate pain.   0  . naproxen (NAPROSYN) 375 MG tablet Take 1 tablet (375 mg total) by mouth  2 (two) times daily with a meal. (Patient not taking: Reported on 09/22/2014) 20 tablet 0  . traMADol (ULTRAM) 50 MG tablet Take 1 tablet (50 mg total) by mouth every 6 (six) hours as needed. 15 tablet 0   Allergies  Allergen Reactions  . Ciprofloxacin Other (See Comments)    sores and blisters     Exam:  BP 132/92 mmHg  Pulse 109  Temp(Src) 97.8 F (36.6 C) (Oral)  Resp 18  SpO2 97%  LMP 08/28/2014 Gen: Well NAD nontoxic appearing HEENT: EOMI,  MMM Lungs: Normal work of breathing. CTABL Heart: RRR no MRG Abd: NABS, Soft. Nondistended, mildly diffusely tender no masses palpated. No guarding or rebound. No CV angle tenderness to percussion Exts: Brisk capillary refill, warm and well perfused.    ER notes and urine culture results reviewed. No results found for this or any previous visit (from the past 24 hour(s)). No results found.  Assessment and Plan: 48 y.o. female with urinary tract infection. Not treated. Switch to Keflex. Tramadol for pain. Recommend appropriate dosing of ibuprofen. Follow-up for IUD placement  Discussed warning signs or symptoms. Please see discharge instructions. Patient expresses understanding.     Gregor Hams, MD 09/25/14 [place] Corey, MD 09/25/14 [phone]

## 2014-09-25 NOTE — Discharge Instructions (Signed)
Thank you for coming in today. STOP bactrim.  Take keflex three times daily for 1 week.  Take tramadol for severe pain.  Do not take more than 3 tablets of 800mg  daily.  Follow up with your doctor.    Abdominal Pain, Women Abdominal (stomach, pelvic, or belly) pain can be caused by many things. It is important to tell your doctor:  The location of the pain.  Does it come and go or is it present all the time?  Are there things that start the pain (eating certain foods, exercise)?  Are there other symptoms associated with the pain (fever, nausea, vomiting, diarrhea)? All of this is helpful to know when trying to find the cause of the pain. CAUSES   Stomach: virus or bacteria infection, or ulcer.  Intestine: appendicitis (inflamed appendix), regional ileitis (Crohn's disease), ulcerative colitis (inflamed colon), irritable bowel syndrome, diverticulitis (inflamed diverticulum of the colon), or cancer of the stomach or intestine.  Gallbladder disease or stones in the gallbladder.  Kidney disease, kidney stones, or infection.  Pancreas infection or cancer.  Fibromyalgia (pain disorder).  Diseases of the female organs:  Uterus: fibroid (non-cancerous) tumors or infection.  Fallopian tubes: infection or tubal pregnancy.  Ovary: cysts or tumors.  Pelvic adhesions (scar tissue).  Endometriosis (uterus lining tissue growing in the pelvis and on the pelvic organs).  Pelvic congestion syndrome (female organs filling up with blood just before the menstrual period).  Pain with the menstrual period.  Pain with ovulation (producing an egg).  Pain with an IUD (intrauterine device, birth control) in the uterus.  Cancer of the female organs.  Functional pain (pain not caused by a disease, may improve without treatment).  Psychological pain.  Depression. DIAGNOSIS  Your doctor will decide the seriousness of your pain by doing an examination.  Blood  tests.  X-rays.  Ultrasound.  CT scan (computed tomography, special type of X-ray).  MRI (magnetic resonance imaging).  Cultures, for infection.  Barium enema (dye inserted in the large intestine, to better view it with X-rays).  Colonoscopy (looking in intestine with a lighted tube).  Laparoscopy (minor surgery, looking in abdomen with a lighted tube).  Major abdominal exploratory surgery (looking in abdomen with a large incision). TREATMENT  The treatment will depend on the cause of the pain.   Many cases can be observed and treated at home.  Over-the-counter medicines recommended by your caregiver.  Prescription medicine.  Antibiotics, for infection.  Birth control pills, for painful periods or for ovulation pain.  Hormone treatment, for endometriosis.  Nerve blocking injections.  Physical therapy.  Antidepressants.  Counseling with a psychologist or psychiatrist.  Minor or major surgery. HOME CARE INSTRUCTIONS   Do not take laxatives, unless directed by your caregiver.  Take over-the-counter pain medicine only if ordered by your caregiver. Do not take aspirin because it can cause an upset stomach or bleeding.  Try a clear liquid diet (broth or water) as ordered by your caregiver. Slowly move to a bland diet, as tolerated, if the pain is related to the stomach or intestine.  Have a thermometer and take your temperature several times a day, and record it.  Bed rest and sleep, if it helps the pain.  Avoid sexual intercourse, if it causes pain.  Avoid stressful situations.  Keep your follow-up appointments and tests, as your caregiver orders.  If the pain does not go away with medicine or surgery, you may try:  Acupuncture.  Relaxation exercises (yoga, meditation).  Group  therapy.  Counseling. SEEK MEDICAL CARE IF:   You notice certain foods cause stomach pain.  Your home care treatment is not helping your pain.  You need stronger pain  medicine.  You want your IUD removed.  You feel faint or lightheaded.  You develop nausea and vomiting.  You develop a rash.  You are having side effects or an allergy to your medicine. SEEK IMMEDIATE MEDICAL CARE IF:   Your pain does not go away or gets worse.  You have a fever.  Your pain is felt only in portions of the abdomen. The right side could possibly be appendicitis. The left lower portion of the abdomen could be colitis or diverticulitis.  You are passing blood in your stools (bright red or black tarry stools, with or without vomiting).  You have blood in your urine.  You develop chills, with or without a fever.  You pass out. MAKE SURE YOU:   Understand these instructions.  Will watch your condition.  Will get help right away if you are not doing well or get worse. Document Released: 08/08/2007 Document Revised: 02/25/2014 Document Reviewed: 08/28/2009 [facility] Patient Information 2015 [place], [place]. This information is not intended to replace advice given to you by your health care provider. Make sure you discuss any questions you have with your health care provider.

## 2014-09-25 NOTE — ED Notes (Signed)
C/o UTI sx onset 4 days  Sx today include: abd pain, dysuria, chills Denies fevers Alert, no signs of acute distress.

## 2014-10-25 ENCOUNTER — Encounter (HOSPITAL_COMMUNITY): Payer: Self-pay | Admitting: Emergency Medicine

## 2014-10-25 ENCOUNTER — Emergency Department (HOSPITAL_COMMUNITY)
Admission: EM | Admit: 2014-10-25 | Discharge: 2014-10-25 | Disposition: A | Discharge status: Home / Self Care | Payer: PRIVATE HEALTH INSURANCE | Attending: Emergency Medicine | Admitting: Emergency Medicine

## 2014-10-25 DIAGNOSIS — N946 Dysmenorrhea, unspecified: Secondary | ICD-10-CM | POA: Insufficient documentation

## 2014-10-25 DIAGNOSIS — Z792 Long term (current) use of antibiotics: Secondary | ICD-10-CM | POA: Insufficient documentation

## 2014-10-25 DIAGNOSIS — I10 Essential (primary) hypertension: Secondary | ICD-10-CM | POA: Insufficient documentation

## 2014-10-25 DIAGNOSIS — Z3202 Encounter for pregnancy test, result negative: Secondary | ICD-10-CM | POA: Diagnosis not present

## 2014-10-25 DIAGNOSIS — N939 Abnormal uterine and vaginal bleeding, unspecified: Secondary | ICD-10-CM | POA: Diagnosis present

## 2014-10-25 DIAGNOSIS — Z79899 Other long term (current) drug therapy: Secondary | ICD-10-CM | POA: Diagnosis not present

## 2014-10-25 LAB — BASIC METABOLIC PANEL
Anion gap: 11 (ref 5–15)
BUN: 8 mg/dL (ref 6–23)
CALCIUM: 9.7 mg/dL (ref 8.4–10.5)
CO2: 21 mmol/L (ref 19–32)
Chloride: 105 mEq/L (ref 96–112)
Creatinine, Ser: 0.82 mg/dL (ref 0.50–1.10)
GFR, EST NON AFRICAN AMERICAN: 85 mL/min — AB (ref >90)
Glucose, Bld: 86 mg/dL (ref 70–99)
Potassium: 4 mmol/L (ref 3.5–5.1)
SODIUM: 137 mmol/L (ref 135–145)

## 2014-10-25 LAB — URINALYSIS, ROUTINE W REFLEX MICROSCOPIC
Glucose, UA: NEGATIVE mg/dL
KETONES UR: NEGATIVE mg/dL
Nitrite: NEGATIVE
PROTEIN: 30 mg/dL — AB
Specific Gravity, Urine: 1.034 — ABNORMAL HIGH (ref 1.005–1.030)
UROBILINOGEN UA: 1 mg/dL (ref 0.0–1.0)
pH: 5.5 (ref 5.0–8.0)

## 2014-10-25 LAB — URINE MICROSCOPIC-ADD ON

## 2014-10-25 LAB — CBC WITH DIFFERENTIAL/PLATELET
BASOS PCT: 0 % (ref 0–1)
Basophils Absolute: 0 10*3/uL (ref 0.0–0.1)
EOS ABS: 0 10*3/uL (ref 0.0–0.7)
Eosinophils Relative: 1 % (ref 0–5)
HCT: 45.3 % (ref 36.0–46.0)
Hemoglobin: 14.4 g/dL (ref 12.0–15.0)
Lymphocytes Relative: 13 % (ref 12–46)
Lymphs Abs: 1 10*3/uL (ref 0.7–4.0)
MCH: 28.6 pg (ref 26.0–34.0)
MCHC: 31.8 g/dL (ref 30.0–36.0)
MCV: 90.1 fL (ref 78.0–100.0)
Monocytes Absolute: 0.3 10*3/uL (ref 0.1–1.0)
Monocytes Relative: 4 % (ref 3–12)
NEUTROS PCT: 82 % — AB (ref 43–77)
Neutro Abs: 6.1 10*3/uL (ref 1.7–7.7)
PLATELETS: 318 10*3/uL (ref 150–400)
RBC: 5.03 MIL/uL (ref 3.87–5.11)
RDW: 14.2 % (ref 11.5–15.5)
WBC: 7.4 10*3/uL (ref 4.0–10.5)

## 2014-10-25 LAB — WET PREP, GENITAL
Clue Cells Wet Prep HPF POC: NONE SEEN
Trich, Wet Prep: NONE SEEN
WBC, Wet Prep HPF POC: NONE SEEN
Yeast Wet Prep HPF POC: NONE SEEN

## 2014-10-25 LAB — PREGNANCY, URINE: Preg Test, Ur: NEGATIVE

## 2014-10-25 MED ORDER — ONDANSETRON 4 MG PO TBDP
4.0000 mg | ORAL_TABLET | Freq: Once | ORAL | Status: AC
  Administered 2014-10-25: 4 mg via ORAL
  Filled 2014-10-25: qty 1

## 2014-10-25 MED ORDER — HYDROCODONE-ACETAMINOPHEN 5-325 MG PO TABS: 1.0000 | ORAL_TABLET | Freq: Four times a day (QID) | ORAL | Status: DC | PRN

## 2014-10-25 MED ORDER — OXYCODONE-ACETAMINOPHEN 5-325 MG PO TABS
2.0000 | ORAL_TABLET | Freq: Once | ORAL | Status: AC
  Administered 2014-10-25: 2 via ORAL
  Filled 2014-10-25: qty 2

## 2014-10-25 MED ORDER — IBUPROFEN 800 MG PO TABS
800.0000 mg | ORAL_TABLET | Freq: Once | ORAL | Status: AC
  Administered 2014-10-25: 800 mg via ORAL
  Filled 2014-10-25: qty 1

## 2014-10-25 NOTE — Discharge Instructions (Signed)
Call your gynecologist for further evaluation of your painful and heavy periods. Call for a follow up appointment with a Family or Primary Care Provider.  Return if Symptoms worsen.   Take medication as prescribed.

## 2014-10-25 NOTE — ED Notes (Signed)
Patient c/o vaginal bleeding x7 days and abdominal pain. Took Ultram x2 hours ago with no alleviation of abdominal pain. No other complaints/concerns.

## 2014-10-25 NOTE — ED Provider Notes (Signed)
CSN: [id number]     Arrival date & time 10/25/14  1412 History   First MD Initiated Contact with Patient 10/25/14 1651     Chief Complaint  Patient presents with  . Vaginal Bleeding  . Abdominal Pain     (Consider location/radiation/quality/duration/timing/severity/associated sxs/prior Treatment) HPI Comments: The patient is a 49 year old female with past medical history of hypertension, dysmenorrhea presents emergency room chief complaint of abnormal vaginal bleeding for 8 days. Patient reports heavy menstrual. Onset 8 days ago. Reports approximately 10 pads per day, with large cramps. She also reports lower abdominal cramping, sharp pain she reports one episode of emesis today relating it to the 6 Ultram she took for pain. Patient denies because patient, diarrhea, urinary symptoms, abnormal vaginal discharge. Patient's last sexual intercourse 2 years ago. OB gynecologist Raquel Sarna. Plan for possible IUD, hysterectomy, oral contraceptives.     Patient is a 49 y.o. female presenting with vaginal bleeding and abdominal pain. The history is provided by the patient. No language interpreter was used.  Vaginal Bleeding Associated symptoms: abdominal pain, nausea and vaginal discharge   Associated symptoms: no fever   Abdominal Pain Associated symptoms: nausea, vaginal bleeding, vaginal discharge and vomiting   Associated symptoms: no chills, no constipation, no diarrhea and no fever     Past Medical History  Diagnosis Date  . Hypertension   . Dysmenorrhea    Past Surgical History  Procedure Laterality Date  . Gallbladder surgery  2011  . Hand surgery  20 years ago    surgery due to infection per pt.   Family History  Problem Relation Age of Onset  . Adopted: Yes   History  Substance Use Topics  . Smoking status: Never Smoker   . Smokeless tobacco: Not on file  . Alcohol Use: No     Comment: LAST TIME-   2 MTHS AGO   OB History    Gravida Para Term Preterm AB TAB SAB Ectopic  Multiple Living   2    2  2         Review of Systems  Constitutional: Negative for fever and chills.  Gastrointestinal: Positive for nausea, vomiting and abdominal pain. Negative for diarrhea, constipation and blood in stool.  Genitourinary: Positive for vaginal bleeding and vaginal discharge.      Allergies  Ciprofloxacin  Home Medications   Prior to Admission medications   Medication Sig Start Date End Date Taking? Authorizing Provider  cephALEXin (KEFLEX) 500 MG capsule Take 1 capsule (500 mg total) by mouth 3 (three) times daily. 09/25/14   Gregor Hams, MD  cloNIDine (CATAPRES) 0.2 MG tablet Take 1 tablet (0.2 mg total) by mouth 2 (two) times daily. Patient taking differently: Take 0.2 mg by mouth 4 (four) times daily.  08/25/14   Kaitlyn Szekalski, PA-C  ibuprofen (ADVIL,MOTRIN) 200 MG tablet Take 800 mg by mouth every 6 (six) hours as needed for moderate pain.    Historical Provider, MD  ibuprofen (ADVIL,MOTRIN) 800 MG tablet Take 800 mg by mouth daily as needed for moderate pain.  07/12/14   Historical Provider, MD  naproxen (NAPROSYN) 375 MG tablet Take 1 tablet (375 mg total) by mouth 2 (two) times daily with a meal. Patient not taking: Reported on 09/22/2014 09/01/14   Ernestina Patches, MD  traMADol (ULTRAM) 50 MG tablet Take 1 tablet (50 mg total) by mouth every 6 (six) hours as needed. 09/25/14   Gregor Hams, MD   BP 114/83 mmHg  Pulse 84  Temp(Src)  54 F (36.7 C) (Oral)  Resp 16  SpO2 100% Physical Exam  Constitutional: She is oriented to person, place, and time. She appears well-developed and well-nourished. No distress.  HENT:  Head: Normocephalic and atraumatic.  Neck: Neck supple.  Cardiovascular: Normal rate and regular rhythm.   Pulmonary/Chest: Effort normal and breath sounds normal. She has no wheezes. She has no rales.  Abdominal: Soft. There is tenderness in the suprapubic area.  Genitourinary: Cervix exhibits motion tenderness. Cervix exhibits no  friability. Right adnexum displays tenderness. Left adnexum displays tenderness.  Moderate amount of dark blood and posterior vaginal vault. Chaperone present.  Musculoskeletal: Normal range of motion.  Neurological: She is alert and oriented to person, place, and time.  Skin: Skin is warm and dry. She is not diaphoretic. No pallor.  Psychiatric: She has a normal mood and affect. Her behavior is normal.  Nursing note and vitals reviewed.   ED Course  Procedures (including critical care time) Labs Review Labs Reviewed  CBC WITH DIFFERENTIAL - Abnormal; Notable for the following:    Neutrophils Relative % 82 (*)    All other components within normal limits  BASIC METABOLIC PANEL - Abnormal; Notable for the following:    GFR calc non Af Amer 85 (*)    All other components within normal limits  URINALYSIS, ROUTINE W REFLEX MICROSCOPIC - Abnormal; Notable for the following:    Color, Urine AMBER (*)    APPearance CLOUDY (*)    Specific Gravity, Urine 1.034 (*)    Hgb urine dipstick LARGE (*)    Bilirubin Urine MODERATE (*)    Protein, ur 30 (*)    Leukocytes, UA SMALL (*)    All other components within normal limits  URINE MICROSCOPIC-ADD ON - Abnormal; Notable for the following:    Crystals CA OXALATE CRYSTALS (*)    All other components within normal limits  WET PREP, GENITAL  GC/CHLAMYDIA PROBE AMP  PREGNANCY, URINE    Imaging Review No results found.   EKG Interpretation None      MDM   Final diagnoses:  Dysmenorrhea   Patient with long history of dysmenorrhea, multiple visits to the ED for similar complaints presents with increase abnormal vaginal bleeding and pelvic discomfort. CBC without anemia. Pelvic exam shows moderate blood and posterior vaginal vault with cervical motion tenderness and adnexal tenderness bilaterally likely due to cramping. The patient not currently sexually active will not treat for STI/PID. Plan to discharge home with narcotic pain medication  follow-up with gynecologist for further treatment, procedure for dysmenorrhea. Discussed lab results, and treatment plan with the patient. Return precautions given. Reports understanding and no other concerns at this time.  Patient is stable for discharge at this time.    Harvie Heck, PA-C 10/26/14 Jim Hogg, MD 10/26/14 [phone]

## 2014-10-30 LAB — GC/CHLAMYDIA PROBE AMP
CT PROBE, AMP APTIMA: NEGATIVE
GC Probe RNA: NEGATIVE

## 2014-11-11 ENCOUNTER — Ambulatory Visit (INDEPENDENT_AMBULATORY_CARE_PROVIDER_SITE_OTHER): Payer: PRIVATE HEALTH INSURANCE | Admitting: Family Medicine

## 2014-11-11 VITALS — BP 138/82 | HR 90 | Temp 98.6°F | Resp 16 | Ht 62.0 in | Wt 124.0 lb

## 2014-11-11 DIAGNOSIS — N946 Dysmenorrhea, unspecified: Secondary | ICD-10-CM | POA: Diagnosis not present

## 2014-11-11 DIAGNOSIS — I10 Essential (primary) hypertension: Secondary | ICD-10-CM

## 2014-11-11 MED ORDER — CLONIDINE HCL 0.3 MG PO TABS: 0.3000 mg | ORAL_TABLET | Freq: Three times a day (TID) | ORAL | Status: DC

## 2014-11-11 MED ORDER — TRAMADOL HCL 50 MG PO TABS: 50.0000 mg | ORAL_TABLET | Freq: Four times a day (QID) | ORAL | Status: DC | PRN

## 2014-11-11 MED ORDER — CLONIDINE HCL 0.3 MG PO TABS
0.3000 mg | ORAL_TABLET | Freq: Three times a day (TID) | ORAL | Status: DC
Start: 2014-11-11 — End: 2014-11-11

## 2014-11-11 MED ORDER — HYDROCODONE-ACETAMINOPHEN 5-325 MG PO TABS: 1.0000 | ORAL_TABLET | Freq: Four times a day (QID) | ORAL | Status: DC | PRN

## 2014-11-11 NOTE — Patient Instructions (Signed)
Hypertension Hypertension, commonly called high blood pressure, is when the force of blood pumping through your arteries is too strong. Your arteries are the blood vessels that carry blood from your heart throughout your body. A blood pressure reading consists of a higher number over a lower number, such as 110/72. The higher number (systolic) is the pressure inside your arteries when your heart pumps. The lower number (diastolic) is the pressure inside your arteries when your heart relaxes. Ideally you want your blood pressure below 120/80. Hypertension forces your heart to work harder to pump blood. Your arteries may become narrow or stiff. Having hypertension puts you at risk for heart disease, stroke, and other problems.  RISK FACTORS Some risk factors for high blood pressure are controllable. Others are not.  Risk factors you cannot control include:   Race. You may be at higher risk if you are African American.  Age. Risk increases with age.  Gender. Men are at higher risk than women before age 45 years. After age 65, women are at higher risk than men. Risk factors you can control include:  Not getting enough exercise or physical activity.  Being overweight.  Getting too much fat, sugar, calories, or salt in your diet.  Drinking too much alcohol. SIGNS AND SYMPTOMS Hypertension does not usually cause signs or symptoms. Extremely high blood pressure (hypertensive crisis) may cause headache, anxiety, shortness of breath, and nosebleed. DIAGNOSIS  To check if you have hypertension, your health care provider will measure your blood pressure while you are seated, with your arm held at the level of your heart. It should be measured at least twice using the same arm. Certain conditions can cause a difference in blood pressure between your right and left arms. A blood pressure reading that is higher than normal on one occasion does not mean that you need treatment. If one blood pressure reading  is high, ask your health care provider about having it checked again. TREATMENT  Treating high blood pressure includes making lifestyle changes and possibly taking medicine. Living a healthy lifestyle can help lower high blood pressure. You may need to change some of your habits. Lifestyle changes may include:  Following the DASH diet. This diet is high in fruits, vegetables, and whole grains. It is low in salt, red meat, and added sugars.  Getting at least 2 hours of brisk physical activity every week.  Losing weight if necessary.  Not smoking.  Limiting alcoholic beverages.  Learning ways to reduce stress. If lifestyle changes are not enough to get your blood pressure under control, your health care provider may prescribe medicine. You may need to take more than one. Work closely with your health care provider to understand the risks and benefits. HOME CARE INSTRUCTIONS  Have your blood pressure rechecked as directed by your health care provider.   Take medicines only as directed by your health care provider. Follow the directions carefully. Blood pressure medicines must be taken as prescribed. The medicine does not work as well when you skip doses. Skipping doses also puts you at risk for problems.   Do not smoke.   Monitor your blood pressure at home as directed by your health care provider. SEEK MEDICAL CARE IF:   You think you are having a reaction to medicines taken.  You have recurrent headaches or feel dizzy.  You have swelling in your ankles.  You have trouble with your vision. SEEK IMMEDIATE MEDICAL CARE IF:  You develop a severe headache or confusion.    You have unusual weakness, numbness, or feel faint.  You have severe chest or abdominal pain.  You vomit repeatedly.  You have trouble breathing. MAKE SURE YOU:   Understand these instructions.  Will watch your condition.  Will get help right away if you are not doing well or get worse. Document  Released: 10/11/2005 Document Revised: 02/25/2014 Document Reviewed: 08/03/2013 [facility] Patient Information 2015 [place], [place]. This information is not intended to replace advice given to you by your health care provider. Make sure you discuss any questions you have with your health care provider. Dysmenorrhea Menstrual cramps (dysmenorrhea) are caused by the muscles of the uterus tightening (contracting) during a menstrual period. For some women, this discomfort is merely bothersome. For others, dysmenorrhea can be severe enough to interfere with everyday activities for a few days each month. Primary dysmenorrhea is menstrual cramps that last a couple of days when you start having menstrual periods or soon after. This often begins after a teenager starts having her period. As a woman gets older or has a baby, the cramps will usually lessen or disappear. Secondary dysmenorrhea begins later in life, lasts longer, and the pain may be stronger than primary dysmenorrhea. The pain may start before the period and last a few days after the period.  CAUSES  Dysmenorrhea is usually caused by an underlying problem, such as:  The tissue lining the uterus grows outside of the uterus in other areas of the body (endometriosis).  The endometrial tissue, which normally lines the uterus, is found in or grows into the muscular walls of the uterus (adenomyosis).  The pelvic blood vessels are engorged with blood just before the menstrual period (pelvic congestive syndrome).  Overgrowth of cells (polyps) in the lining of the uterus or cervix.  Falling down of the uterus (prolapse) because of loose or stretched ligaments.  Depression.  Bladder problems, infection, or inflammation.  Problems with the intestine, a tumor, or irritable bowel syndrome.  Cancer of the female organs or bladder.  A severely tipped uterus.  A very tight opening or closed cervix.  Noncancerous tumors of the uterus  (fibroids).  Pelvic inflammatory disease (PID).  Pelvic scarring (adhesions) from a previous surgery.  Ovarian cyst.  An intrauterine device (IUD) used for birth control. RISK FACTORS You may be at greater risk of dysmenorrhea if:  You are younger than age 8.  You started puberty early.  You have irregular or heavy bleeding.  You have never given birth.  You have a family history of this problem.  You are a smoker. SIGNS AND SYMPTOMS   Cramping or throbbing pain in your lower abdomen.  Headaches.  Lower back pain.  Nausea or vomiting.  Diarrhea.  Sweating or dizziness.  Loose stools. DIAGNOSIS  A diagnosis is based on your history, symptoms, physical exam, diagnostic tests, or procedures. Diagnostic tests or procedures may include:  Blood tests.  Ultrasonography.  An examination of the lining of the uterus (dilation and curettage, D&C).  An examination inside your abdomen or pelvis with a scope (laparoscopy).  X-rays.  CT scan.  MRI.  An examination inside the bladder with a scope (cystoscopy).  An examination inside the intestine or stomach with a scope (colonoscopy, gastroscopy). TREATMENT  Treatment depends on the cause of the dysmenorrhea. Treatment may include:  Pain medicine prescribed by your health care provider.  Birth control pills or an IUD with progesterone hormone in it.  Hormone replacement therapy.  Nonsteroidal anti-inflammatory drugs (NSAIDs). These may help stop  the production of prostaglandins.  Surgery to remove adhesions, endometriosis, ovarian cyst, or fibroids.  Removal of the uterus (hysterectomy).  Progesterone shots to stop the menstrual period.  Cutting the nerves on the sacrum that go to the female organs (presacral neurectomy).  Electric current to the sacral nerves (sacral nerve stimulation).  Antidepressant medicine.  Psychiatric therapy, counseling, or group therapy.  Exercise and physical  therapy.  Meditation and yoga therapy.  Acupuncture. HOME CARE INSTRUCTIONS   Only take over-the-counter or prescription medicines as directed by your health care provider.  Place a heating pad or hot water bottle on your lower back or abdomen. Do not sleep with the heating pad.  Use aerobic exercises, walking, swimming, biking, and other exercises to help lessen the cramping.  Massage to the lower back or abdomen may help.  Stop smoking.  Avoid alcohol and caffeine. SEEK MEDICAL CARE IF:   Your pain does not get better with medicine.  You have pain with sexual intercourse.  Your pain increases and is not controlled with medicines.  You have abnormal vaginal bleeding with your period.  You develop nausea or vomiting with your period that is not controlled with medicine. SEEK IMMEDIATE MEDICAL CARE IF:  You pass out.  Document Released: 10/11/2005 Document Revised: 06/13/2013 Document Reviewed: 03/29/2013 [facility] Patient Information 2015 [place], [place]. This information is not intended to replace advice given to you by your health care provider. Make sure you discuss any questions you have with your health care provider.

## 2014-11-11 NOTE — Progress Notes (Signed)
This chart was scribed for Robyn Haber, MD by Einar Pheasant, ED Scribe. This patient was seen in room 10 and the patient's care was started at 6:10 PM.  Patient ID: Regina Estes MRN: [id number], DOB: 20-May-1966, 49 y.o. Date of Encounter: 11/11/2014, 6:10 PM  Primary Physician: No PCP Per Patient  Chief Complaint:  Chief Complaint  Patient presents with  . Medication Refill    BP meds  . Menstrual Problem    Cramping a lot     HPI: 49 y.o. 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 Today pt presents to the office seeking medication refill on her BP medication. She is also complaining of increased cramping during her menstrual cycle. She states that she has an appointment with a specialist in 2 weeks. Pt endorses having a headache. Denies any fever, chills, nausea, emesis, SOB, chest pain, palpitations, wheezing, or weakness.   Pt is a patient at [facility].  Nulliparous  Past Medical History  Diagnosis Date  . Hypertension   . Dysmenorrhea      Home Meds: Prior to Admission medications   Medication Sig Start Date End Date Taking? Authorizing Provider  cloNIDine (CATAPRES) 0.3 MG tablet Take 0.3 mg by mouth 3 (three) times daily.   Yes Historical Provider, MD  traMADol (ULTRAM) 50 MG tablet Take 1 tablet (50 mg total) by mouth every 6 (six) hours as needed. Patient not taking: Reported on 11/11/2014 09/25/14   Gregor Hams, MD    Allergies:  Allergies  Allergen Reactions  . Ciprofloxacin Other (See Comments)    sores and blisters    History   Social History  . Marital Status: Widowed    Spouse Name: N/A    Number of Children: N/A  . Years of Education: N/A   Occupational History  . Not on file.   Social History Main Topics  . Smoking status: Never Smoker   . Smokeless tobacco: Not on file  . Alcohol Use: No     Comment: LAST TIME-   2 MTHS AGO  . Drug Use: No  . Sexual Activity: Not Currently   Other Topics  Concern  . Not on file   Social History Narrative     Review of Systems:Positive abdominal cramping secondary to menstrual cycle Constitutional: negative for chills, fever, night sweats, weight changes, or fatigue  HEENT: negative for vision changes, hearing loss, congestion, rhinorrhea, ST, epistaxis, or sinus pressure Cardiovascular: negative for chest pain or palpitations Respiratory: negative for hemoptysis, wheezing, shortness of breath, or cough Abdominal: negative for nausea, vomiting, diarrhea, or constipation Dermatological: negative for rash Neurologic: negative for headache, dizziness, or syncope All other systems reviewed and are otherwise negative with the exception to those above and in the HPI.   Physical Exam: Regular heart rhythm with rapid rate.  Blood pressure 138/82, pulse 90, temperature 98.6 F (37 C), resp. rate 16, height 5\' 2"  (1.575 m), weight 124 lb (56.246 kg), last menstrual period 11/10/2014., Body mass index is 22.67 kg/(m^2). General: Well developed, well nourished, in no acute distress. Head: Normocephalic, atraumatic, eyes without discharge, sclera non-icteric, nares are without discharge. Bilateral auditory canals clear, TM's are without perforation, pearly grey and translucent with reflective cone of light bilaterally. Oral cavity moist, posterior pharynx without exudate, erythema, peritonsillar abscess, or post nasal drip.  Neck: Supple. No thyromegaly. Full ROM. No lymphadenopathy. Lungs: Clear bilaterally to auscultation without wheezes, rales, or rhonchi. Breathing is unlabored. Heart:  No murmurs, rubs, or gallops appreciated. Abdomen: Soft, non-tender, non-distended with normoactive bowel sounds. No hepatomegaly. No rebound/guarding. No obvious abdominal masses. Msk:  Strength and tone normal for age. Extremities/Skin: Warm and dry. No clubbing or cyanosis. No edema. No rashes or suspicious lesions. Neuro: Alert and oriented X 3. Moves all  extremities spontaneously. Gait is normal. CNII-XII grossly in tact. Psych:  Responds to questions appropriately with a normal affect.     ASSESSMENT AND PLAN:  49 y.o. year old female with  This chart was scribed in my presence and reviewed by me personally.    ICD-9-CM ICD-10-CM   1. Dysmenorrhea 625.3 N94.6 traMADol (ULTRAM) 50 MG tablet     DISCONTINUED: HYDROcodone-acetaminophen (NORCO) 5-325 MG per tablet  2. Essential hypertension 401.9 I10 cloNIDine (CATAPRES) 0.3 MG tablet     DISCONTINUED: cloNIDine (CATAPRES) 0.3 MG tablet     Signed, Robyn Haber, MD   Signed, Robyn Haber, MD 11/11/2014 6:10 PM

## 2014-11-23 ENCOUNTER — Emergency Department (HOSPITAL_COMMUNITY)
Admission: EM | Admit: 2014-11-23 | Discharge: 2014-11-23 | Disposition: A | Discharge status: Home / Self Care | Payer: PRIVATE HEALTH INSURANCE | Attending: Emergency Medicine | Admitting: Emergency Medicine

## 2014-11-23 ENCOUNTER — Encounter (HOSPITAL_COMMUNITY): Payer: Self-pay

## 2014-11-23 DIAGNOSIS — N946 Dysmenorrhea, unspecified: Secondary | ICD-10-CM | POA: Insufficient documentation

## 2014-11-23 DIAGNOSIS — R1030 Lower abdominal pain, unspecified: Secondary | ICD-10-CM | POA: Diagnosis present

## 2014-11-23 DIAGNOSIS — I10 Essential (primary) hypertension: Secondary | ICD-10-CM | POA: Diagnosis not present

## 2014-11-23 DIAGNOSIS — Z3202 Encounter for pregnancy test, result negative: Secondary | ICD-10-CM | POA: Diagnosis not present

## 2014-11-23 DIAGNOSIS — Z79899 Other long term (current) drug therapy: Secondary | ICD-10-CM | POA: Diagnosis not present

## 2014-11-23 LAB — POC URINE PREG, ED: PREG TEST UR: NEGATIVE

## 2014-11-23 MED ORDER — TRAMADOL HCL 50 MG PO TABS: 100.0000 mg | ORAL_TABLET | Freq: Four times a day (QID) | ORAL | Status: DC | PRN

## 2014-11-23 MED ORDER — TRAMADOL HCL 50 MG PO TABS: 50.0000 mg | ORAL_TABLET | Freq: Four times a day (QID) | ORAL | Status: DC | PRN

## 2014-11-23 MED ORDER — TRAMADOL HCL 50 MG PO TABS
100.0000 mg | ORAL_TABLET | Freq: Once | ORAL | Status: AC
  Administered 2014-11-23: 100 mg via ORAL
  Filled 2014-11-23: qty 2

## 2014-11-23 NOTE — ED Notes (Signed)
Bed: WA03 Expected date:  Expected time:  Means of arrival:  Comments: 

## 2014-11-23 NOTE — Discharge Instructions (Signed)
Dysmenorrhea Take ibuprofen for mild pain or the pain medicine prescribed for bad pain. Keep your scheduled appointment with your gynecologist on 11/30/2014 Menstrual cramps (dysmenorrhea) are caused by the muscles of the uterus tightening (contracting) during a menstrual period. For some women, this discomfort is merely bothersome. For others, dysmenorrhea can be severe enough to interfere with everyday activities for a few days each month. Primary dysmenorrhea is menstrual cramps that last a couple of days when you start having menstrual periods or soon after. This often begins after a teenager starts having her period. As a woman gets older or has a baby, the cramps will usually lessen or disappear. Secondary dysmenorrhea begins later in life, lasts longer, and the pain may be stronger than primary dysmenorrhea. The pain may start before the period and last a few days after the period.  CAUSES  Dysmenorrhea is usually caused by an underlying problem, such as:  The tissue lining the uterus grows outside of the uterus in other areas of the body (endometriosis).  The endometrial tissue, which normally lines the uterus, is found in or grows into the muscular walls of the uterus (adenomyosis).  The pelvic blood vessels are engorged with blood just before the menstrual period (pelvic congestive syndrome).  Overgrowth of cells (polyps) in the lining of the uterus or cervix.  Falling down of the uterus (prolapse) because of loose or stretched ligaments.  Depression.  Bladder problems, infection, or inflammation.  Problems with the intestine, a tumor, or irritable bowel syndrome.  Cancer of the female organs or bladder.  A severely tipped uterus.  A very tight opening or closed cervix.  Noncancerous tumors of the uterus (fibroids).  Pelvic inflammatory disease (PID).  Pelvic scarring (adhesions) from a previous surgery.  Ovarian cyst.  An intrauterine device (IUD) used for birth  control. RISK FACTORS You may be at greater risk of dysmenorrhea if:  You are younger than age 62.  You started puberty early.  You have irregular or heavy bleeding.  You have never given birth.  You have a family history of this problem.  You are a smoker. SIGNS AND SYMPTOMS   Cramping or throbbing pain in your lower abdomen.  Headaches.  Lower back pain.  Nausea or vomiting.  Diarrhea.  Sweating or dizziness.  Loose stools. DIAGNOSIS  A diagnosis is based on your history, symptoms, physical exam, diagnostic tests, or procedures. Diagnostic tests or procedures may include:  Blood tests.  Ultrasonography.  An examination of the lining of the uterus (dilation and curettage, D&C).  An examination inside your abdomen or pelvis with a scope (laparoscopy).  X-rays.  CT scan.  MRI.  An examination inside the bladder with a scope (cystoscopy).  An examination inside the intestine or stomach with a scope (colonoscopy, gastroscopy). TREATMENT  Treatment depends on the cause of the dysmenorrhea. Treatment may include:  Pain medicine prescribed by your health care provider.  Birth control pills or an IUD with progesterone hormone in it.  Hormone replacement therapy.  Nonsteroidal anti-inflammatory drugs (NSAIDs). These may help stop the production of prostaglandins.  Surgery to remove adhesions, endometriosis, ovarian cyst, or fibroids.  Removal of the uterus (hysterectomy).  Progesterone shots to stop the menstrual period.  Cutting the nerves on the sacrum that go to the female organs (presacral neurectomy).  Electric current to the sacral nerves (sacral nerve stimulation).  Antidepressant medicine.  Psychiatric therapy, counseling, or group therapy.  Exercise and physical therapy.  Meditation and yoga therapy.  Acupuncture. HOME  CARE INSTRUCTIONS   Only take over-the-counter or prescription medicines as directed by your health care  provider.  Place a heating pad or hot water bottle on your lower back or abdomen. Do not sleep with the heating pad.  Use aerobic exercises, walking, swimming, biking, and other exercises to help lessen the cramping.  Massage to the lower back or abdomen may help.  Stop smoking.  Avoid alcohol and caffeine. SEEK MEDICAL CARE IF:   Your pain does not get better with medicine.  You have pain with sexual intercourse.  Your pain increases and is not controlled with medicines.  You have abnormal vaginal bleeding with your period.  You develop nausea or vomiting with your period that is not controlled with medicine. SEEK IMMEDIATE MEDICAL CARE IF:  You pass out.  Document Released: 10/11/2005 Document Revised: 06/13/2013 Document Reviewed: 03/29/2013 [facility] Patient Information 2015 [place], [place]. This information is not intended to replace advice given to you by your health care provider. Make sure you discuss any questions you have with your health care provider.

## 2014-11-23 NOTE — ED Provider Notes (Signed)
CSN: [id number]     Arrival date & time 11/23/14  2248 History   First MD Initiated Contact with Patient 11/23/14 [phone]     Chief Complaint  Patient presents with  . Abdominal Cramping     (Consider location/radiation/quality/duration/timing/severity/associated sxs/prior Treatment) HPI Complains of low abdominal cramping, suprapubic in location crampy and severe onset 5 days ago typical dysmenorrhea she's had for the past one year. Last menstrual period started 5 days ago. She's been seen in the emergency department and subsequently by her gynecologist for same complaint prescribed hydrocodone which she says she cannot tolerate because it makes her nervous. Ultram 100 mg his work well for her dysmenorrhea in the past. She has also tried ibuprofen without relief. No vaginal discharge no urinary symptoms No other associated symptoms. Nothing makes symptoms better or worse. Past Medical History  Diagnosis Date  . Hypertension   . Dysmenorrhea    Past Surgical History  Procedure Laterality Date  . Gallbladder surgery  2011  . Hand surgery  20 years ago    surgery due to infection per pt.   Family History  Problem Relation Age of Onset  . Adopted: Yes   History  Substance Use Topics  . Smoking status: Never Smoker   . Smokeless tobacco: Not on file  . Alcohol Use: No     Comment: LAST TIME-   2 MTHS AGO   OB History    Gravida Para Term Preterm AB TAB SAB Ectopic Multiple Living   2    2  2         Review of Systems  Constitutional: Negative.   HENT: Negative.   Respiratory: Negative.   Cardiovascular: Negative.   Gastrointestinal: Negative.   Genitourinary: Positive for menstrual problem and pelvic pain. Negative for dysuria and vaginal discharge.       Dysmenorrhea  Musculoskeletal: Negative.   Skin: Negative.   Neurological: Negative.   Psychiatric/Behavioral: Negative.   All other systems reviewed and are negative.     Allergies  Ciprofloxacin  Home Medications    Prior to Admission medications   Medication Sig Start Date End Date Taking? Authorizing Provider  cloNIDine (CATAPRES) 0.3 MG tablet Take 1 tablet (0.3 mg total) by mouth 3 (three) times daily. 11/11/14   Robyn Haber, MD  traMADol (ULTRAM) 50 MG tablet Take 1 tablet (50 mg total) by mouth every 6 (six) hours as needed. 11/11/14   Robyn Haber, MD   BP 120/86 mmHg  Pulse 102  Temp(Src) 98.3 F (36.8 C) (Oral)  Resp 16  SpO2 100%  LMP 11/10/2014 Physical Exam  Constitutional: She appears well-developed and well-nourished.  HENT:  Head: Normocephalic and atraumatic.  Eyes: Conjunctivae are normal. Pupils are equal, round, and reactive to light.  Neck: Neck supple. No tracheal deviation present. No thyromegaly present.  Cardiovascular: Normal rate and regular rhythm.   No murmur heard. Pulmonary/Chest: Effort normal and breath sounds normal.  Abdominal: Soft. Bowel sounds are normal. She exhibits no distension. There is tenderness.  Suprapubic tenderness  Genitourinary:  Refused pelvic exam  Musculoskeletal: Normal range of motion. She exhibits no edema or tenderness.  Neurological: She is alert. Coordination normal.  Skin: Skin is warm and dry. No rash noted.  Psychiatric: She has a normal mood and affect.  Nursing note and vitals reviewed.   ED Course  Procedures (including critical care time) Labs Review Labs Reviewed - No data to display  Imaging Review No results found.   EKG Interpretation None  Results for orders placed or performed during the hospital encounter of 11/23/14  POC Urine Pregnancy, ED (do NOT order at [facility])  Result Value Ref Range   Preg Test, Ur NEGATIVE NEGATIVE   No results found.   MDM  Than prescription Ultram. She has a follow-up appointment scheduled with her gynecologist for 11/30/2014 Diagnosis dysmenorrhea Final diagnoses:  None        Orlie Dakin, MD 11/23/14 1028

## 2014-11-23 NOTE — ED Notes (Signed)
Patient c/o abdominal cramping from menstrual period. Patient states she saw her OB/GYN on 11/08/14 Patient states she was prescribed Hydrocodone, but states the pain med is too strong for her. Patient states she was also prescribed Ultram, but needs a higher dose.

## 2014-12-15 ENCOUNTER — Emergency Department (HOSPITAL_COMMUNITY): Payer: PRIVATE HEALTH INSURANCE

## 2014-12-15 ENCOUNTER — Emergency Department (HOSPITAL_COMMUNITY)
Admission: EM | Admit: 2014-12-15 | Discharge: 2014-12-15 | Disposition: A | Discharge status: Home / Self Care | Payer: PRIVATE HEALTH INSURANCE | Attending: Emergency Medicine | Admitting: Emergency Medicine

## 2014-12-15 ENCOUNTER — Encounter (HOSPITAL_COMMUNITY): Payer: Self-pay | Admitting: Nurse Practitioner

## 2014-12-15 DIAGNOSIS — K1120 Sialoadenitis, unspecified: Secondary | ICD-10-CM | POA: Insufficient documentation

## 2014-12-15 DIAGNOSIS — Z79899 Other long term (current) drug therapy: Secondary | ICD-10-CM | POA: Diagnosis not present

## 2014-12-15 DIAGNOSIS — I10 Essential (primary) hypertension: Secondary | ICD-10-CM | POA: Insufficient documentation

## 2014-12-15 DIAGNOSIS — Z8742 Personal history of other diseases of the female genital tract: Secondary | ICD-10-CM | POA: Insufficient documentation

## 2014-12-15 DIAGNOSIS — R220 Localized swelling, mass and lump, head: Secondary | ICD-10-CM | POA: Diagnosis present

## 2014-12-15 LAB — CBC WITH DIFFERENTIAL/PLATELET
BASOS ABS: 0 10*3/uL (ref 0.0–0.1)
BASOS PCT: 0 % (ref 0–1)
EOS PCT: 1 % (ref 0–5)
Eosinophils Absolute: 0.1 10*3/uL (ref 0.0–0.7)
HEMATOCRIT: 37.7 % (ref 36.0–46.0)
Hemoglobin: 12.8 g/dL (ref 12.0–15.0)
Lymphocytes Relative: 23 % (ref 12–46)
Lymphs Abs: 1.7 10*3/uL (ref 0.7–4.0)
MCH: 29 pg (ref 26.0–34.0)
MCHC: 34 g/dL (ref 30.0–36.0)
MCV: 85.5 fL (ref 78.0–100.0)
Monocytes Absolute: 0.7 10*3/uL (ref 0.1–1.0)
Monocytes Relative: 9 % (ref 3–12)
Neutro Abs: 5.1 10*3/uL (ref 1.7–7.7)
Neutrophils Relative %: 67 % (ref 43–77)
Platelets: 256 10*3/uL (ref 150–400)
RBC: 4.41 MIL/uL (ref 3.87–5.11)
RDW: 13.9 % (ref 11.5–15.5)
WBC: 7.6 10*3/uL (ref 4.0–10.5)

## 2014-12-15 LAB — BASIC METABOLIC PANEL
Anion gap: 10 (ref 5–15)
BUN: 10 mg/dL (ref 6–23)
CALCIUM: 9.6 mg/dL (ref 8.4–10.5)
CO2: 20 mmol/L (ref 19–32)
Chloride: 105 mmol/L (ref 96–112)
Creatinine, Ser: 0.82 mg/dL (ref 0.50–1.10)
GFR, EST NON AFRICAN AMERICAN: 85 mL/min — AB (ref >90)
Glucose, Bld: 77 mg/dL (ref 70–99)
POTASSIUM: 3.9 mmol/L (ref 3.5–5.1)
Sodium: 135 mmol/L (ref 135–145)

## 2014-12-15 MED ORDER — IOHEXOL 300 MG/ML  SOLN
100.0000 mL | Freq: Once | INTRAMUSCULAR | Status: AC | PRN
Start: 2014-12-15 — End: 2014-12-15
  Administered 2014-12-15: 100 mL via INTRAVENOUS

## 2014-12-15 MED ORDER — HYDROMORPHONE HCL 1 MG/ML IJ SOLN
1.0000 mg | Freq: Once | INTRAMUSCULAR | Status: AC
  Administered 2014-12-15: 1 mg via INTRAVENOUS
  Filled 2014-12-15: qty 1

## 2014-12-15 MED ORDER — OXYCODONE-ACETAMINOPHEN 5-325 MG PO TABS: 1.0000 | ORAL_TABLET | Freq: Four times a day (QID) | ORAL | Status: DC | PRN

## 2014-12-15 MED ORDER — SODIUM CHLORIDE 0.9 % IV BOLUS (SEPSIS)
1000.0000 mL | INTRAVENOUS | Status: AC
  Administered 2014-12-15: 1000 mL via INTRAVENOUS

## 2014-12-15 MED ORDER — CEPHALEXIN 500 MG PO CAPS: 500.0000 mg | ORAL_CAPSULE | Freq: Four times a day (QID) | ORAL | Status: DC

## 2014-12-15 NOTE — ED Notes (Signed)
md at bedside  Pt alert and oriented x4. Respirations even and unlabored, bilateral symmetrical rise and fall of chest. Skin warm and dry. In no acute distress. Denies needs.   

## 2014-12-15 NOTE — ED Provider Notes (Signed)
CSN: [id number]     Arrival date & time 12/15/14  1755 History   First MD Initiated Contact with Patient 12/15/14 1808     Chief Complaint  Patient presents with  . Neck Swelling     Left side     (Consider location/radiation/quality/duration/timing/severity/associated sxs/prior Treatment) Patient is a 49 y.o. female presenting with neck injury. The history is provided by the patient.  Neck Injury This is a new problem. The current episode started 2 days ago. The problem occurs constantly. The problem has been gradually worsening. Pertinent negatives include no chest pain, no abdominal pain, no headaches and no shortness of breath. Nothing aggravates the symptoms. Nothing relieves the symptoms. She has tried nothing for the symptoms. The treatment provided no relief.    Past Medical History  Diagnosis Date  . Hypertension   . Dysmenorrhea    Past Surgical History  Procedure Laterality Date  . Gallbladder surgery  2011  . Hand surgery  20 years ago    surgery due to infection per pt.   Family History  Problem Relation Age of Onset  . Adopted: Yes   History  Substance Use Topics  . Smoking status: Never Smoker   . Smokeless tobacco: Not on file  . Alcohol Use: No     Comment: LAST TIME-   2 MTHS AGO   OB History    Gravida Para Term Preterm AB TAB SAB Ectopic Multiple Living   2    2  2         Review of Systems  Constitutional: Negative for fever and fatigue.  HENT: Negative for congestion and drooling.   Eyes: Negative for pain.  Respiratory: Negative for cough and shortness of breath.   Cardiovascular: Negative for chest pain.  Gastrointestinal: Negative for nausea, vomiting, abdominal pain and diarrhea.  Genitourinary: Negative for dysuria and hematuria.  Musculoskeletal: Negative for back pain, gait problem and neck pain.  Skin: Negative for color change.  Neurological: Negative for dizziness and headaches.  Hematological: Negative for adenopathy.   Psychiatric/Behavioral: Negative for behavioral problems.  All other systems reviewed and are negative.     Allergies  Ciprofloxacin  Home Medications   Prior to Admission medications   Medication Sig Start Date End Date Taking? Authorizing Provider  cloNIDine (CATAPRES) 0.3 MG tablet Take 1 tablet (0.3 mg total) by mouth 3 (three) times daily. 11/11/14   Robyn Haber, MD  traMADol (ULTRAM) 50 MG tablet Take 2 tablets (100 mg total) by mouth every 6 (six) hours as needed for moderate pain. 11/23/14   Orlie Dakin, MD  traMADol (ULTRAM) 50 MG tablet Take 1 tablet (50 mg total) by mouth every 6 (six) hours as needed. 11/23/14   Orlie Dakin, MD   BP 158/101 mmHg  Pulse 101  Temp(Src) 98.7 F (37.1 C) (Oral)  Resp 16  Ht 5\' 2"  (1.575 m)  Wt 128 lb (58.06 kg)  BMI 23.41 kg/m2  SpO2 100%  LMP 12/15/2014 Physical Exam  Constitutional: She is oriented to person, place, and time. She appears well-developed and well-nourished.  HENT:  Head: Normocephalic.  Mouth/Throat: No oropharyngeal exudate.  The patient is edentulous.  Normal appearing posterior oropharynx without uvular deviation.  No trismus.  I was able to milk some white milk a fluid from the left submandibular salivary gland under the tongue.  Eyes: Conjunctivae and EOM are normal. Pupils are equal, round, and reactive to light.  Neck: Normal range of motion. Neck supple.  Firm, 3 x  3 cm spherical mass noted at the left angle of the jaw. No superficial or cutaneous changes noted.  Normal range of motion of the neck.  Cardiovascular: Normal rate, regular rhythm, normal heart sounds and intact distal pulses.  Exam reveals no gallop and no friction rub.   No murmur heard. Pulmonary/Chest: Effort normal and breath sounds normal. No respiratory distress. She has no wheezes.  Abdominal: Soft. Bowel sounds are normal. There is no tenderness. There is no rebound and no guarding.  Musculoskeletal: Normal range of motion.  She exhibits no edema or tenderness.  Neurological: She is alert and oriented to person, place, and time.  Skin: Skin is warm and dry.  Psychiatric: She has a normal mood and affect. Her behavior is normal.  Nursing note and vitals reviewed.   ED Course  Procedures (including critical care time) Labs Review Labs Reviewed  BASIC METABOLIC PANEL - Abnormal; Notable for the following:    GFR calc non Af Amer 85 (*)    All other components within normal limits  CBC WITH DIFFERENTIAL/PLATELET    Imaging Review Ct Soft Tissue Neck W Contrast  12/15/2014   CLINICAL DATA:  49 year old female with a history of left-sided neck pain. Complaint of swelling. Swelling has been present for 3 years but has gotten worse over the weekend.  EXAM: CT NECK WITH CONTRAST  TECHNIQUE: Multidetector CT imaging of the neck was performed using the standard protocol following the bolus administration of intravenous contrast.  CONTRAST:  128mL OMNIPAQUE IOHEXOL 300 MG/ML  SOLN  COMPARISON:  CT neck 06/17/2014  FINDINGS: Unremarkable appearance of the skullbase. Craniocervical junction unremarkable.  Unremarkable appearance of the visualized intracranial structures.  Hyper enhancing left submandibular gland with dilation of the ductal system. Largest diameter of the duct at the body of the mandible measures approximately 1 cm. The duct remains dilated along its length.  There is a stone at the distal aspect of the duct measuring 12 mm, slightly larger than on the comparison. A second smaller stone measures 3 mm.  No significant inflammatory changes of the fat adjacent to the left submandibular gland, however, there are a few borderline enlarged lymph nodes in the region.  Lymph nodes in the submental region are present, similar to the comparison and not enlarged by CT size criteria.  The inflammatory changes of the left parotid gland result in an mild facial asymmetry. No focal fluid collection or abscess.  The fascial planes  of the head and neck are maintained, with no abnormal soft tissue mass identified. Bilateral parotid glands unremarkable. Parapharyngeal fat planes are maintained. Bilateral masticator space unremarkable.  Retropharyngeal space unremarkable.  Mild asymmetry of the pharyngeal soft tissues. Secretions layered in the posterior pharynx at the level of the glottis.  Patient is edentulous.  Borderline enlarged lymph nodes in the left parotid region, with otherwise no enlarged lymph nodes identified. There are small lymph nodes of the bilateral cervical/jugular chain.  Unremarkable appearance of the carotid system, with no significant atherosclerotic changes identified. Bilateral vertebral arteries are patent, code dominant.  Unremarkable appearance of the venous structures.  No displaced fracture identified. Mild degenerative disc disease of the cervical region without bony stenosis or significant foraminal narrowing. Changes worst at C5-C6.  Unremarkable appearance of the visualized lungs.  IMPRESSION: Left-sided sialoadenitis of the submandibular gland, with 2 associated sialoliths in the distal duct at the floor the mouth. Significant ductal dilation, with reactive lymph nodes of the left submandibular space.  Signed,  Dulcy Fanny.  Earleen Newport, DO  [facility]  [facility]   Electronically Signed   By: Corrie Mckusick D.O.   On: 12/15/2014 20:14     EKG Interpretation None      MDM   Final diagnoses:  Sialoadenitis    6:33 PM 49 y.o. female w hx of HTN who presents with left lateral neck mass and pain. She states that she has had a small mass on the left lateral aspect of her neck for about 5 years. She was told it was a enlarged salivary gland. She states that it began getting bigger and painful 2 days ago. She notes it hurts to swallow now. She has had subjective fevers. She is afebrile and mildly tachycardic on exam. We'll get screening labs and imaging of the  neck.  9:38 PM: Patient found to have left-sided sialoadenitis. I milked the left some angular gland with production of some white milky fluid. Patient is afebrile, has no elevation in wbc. She is tolerating her secretions although she does have pain with swallowing. Will start on Keflex and recommend follow-up with ENT. I recommended she use lemon drops to try and milk the stone out. I have discussed the diagnosis/risks/treatment options with the patient and believe the pt to be eligible for discharge home to follow-up with Dr. Constance Holster. We also discussed returning to the ED immediately if new or worsening sx occur. We discussed the sx which are most concerning (e.g., worsening pain, fever, inability to tolerate po) that necessitate immediate return. Medications administered to the patient during their visit and any new prescriptions provided to the patient are listed below.  Medications given during this visit Medications  sodium chloride 0.9 % bolus 1,000 mL (0 mLs Intravenous Stopped 12/15/14 2023)  HYDROmorphone (DILAUDID) injection 1 mg (1 mg Intravenous Given 12/15/14 1845)  iohexol (OMNIPAQUE) 300 MG/ML solution 100 mL (100 mLs Intravenous Contrast Given 12/15/14 1935)  HYDROmorphone (DILAUDID) injection 1 mg (1 mg Intravenous Given 12/15/14 2022)  HYDROmorphone (DILAUDID) injection 1 mg (1 mg Intravenous Given 12/15/14 2131)    New Prescriptions   CEPHALEXIN (KEFLEX) 500 MG CAPSULE    Take 1 capsule (500 mg total) by mouth 4 (four) times daily.   OXYCODONE-ACETAMINOPHEN (PERCOCET) 5-325 MG PER TABLET    Take 1-2 tablets by mouth every 6 (six) hours as needed.     Pamella Pert, MD 12/15/14 2140

## 2014-12-15 NOTE — ED Notes (Signed)
Pt alert and oriented x4. Respirations even and unlabored, bilateral symmetrical rise and fall of chest. Skin warm and dry. In no acute distress. Denies needs.   

## 2014-12-15 NOTE — ED Notes (Signed)
Pt c/o pain in her left side of neck, obvious swelling noted on inspection which pt states she has had for 3 years but has gotten worse since Friday accompanied by the pain 9/10. Also states she is unable to swallow, states she had "some fevers and chills", denies shortness of breath, elevated B/p for arrival vitals, pt endorses hx of hypertension states she has been unable to swallow her antihypertensives.

## 2014-12-15 NOTE — Discharge Instructions (Signed)
°  Keep well hydrated, apply moist heat to the involved area, massage the gland, and "milk" the duct. Sialogogues, which promote ductal secretions, are often helpful (examples include tart hard candies such as lemon drops). These should be used throughout the day as often as tolerated by the patient.

## 2015-01-06 ENCOUNTER — Ambulatory Visit: Payer: PRIVATE HEALTH INSURANCE | Admitting: Family

## 2015-03-28 ENCOUNTER — Emergency Department (HOSPITAL_COMMUNITY)
Admission: EM | Admit: 2015-03-28 | Discharge: 2015-03-29 | Disposition: A | Discharge status: Home / Self Care | Payer: PRIVATE HEALTH INSURANCE | Attending: Emergency Medicine | Admitting: Emergency Medicine

## 2015-03-28 ENCOUNTER — Encounter (HOSPITAL_COMMUNITY): Payer: Self-pay | Admitting: Emergency Medicine

## 2015-03-28 DIAGNOSIS — N938 Other specified abnormal uterine and vaginal bleeding: Secondary | ICD-10-CM | POA: Diagnosis not present

## 2015-03-28 DIAGNOSIS — Z79899 Other long term (current) drug therapy: Secondary | ICD-10-CM | POA: Diagnosis not present

## 2015-03-28 DIAGNOSIS — Z3202 Encounter for pregnancy test, result negative: Secondary | ICD-10-CM | POA: Diagnosis not present

## 2015-03-28 DIAGNOSIS — N946 Dysmenorrhea, unspecified: Secondary | ICD-10-CM

## 2015-03-28 DIAGNOSIS — N9489 Other specified conditions associated with female genital organs and menstrual cycle: Secondary | ICD-10-CM | POA: Diagnosis present

## 2015-03-28 DIAGNOSIS — I10 Essential (primary) hypertension: Secondary | ICD-10-CM | POA: Insufficient documentation

## 2015-03-28 LAB — URINE MICROSCOPIC-ADD ON

## 2015-03-28 LAB — URINALYSIS, ROUTINE W REFLEX MICROSCOPIC
Bilirubin Urine: NEGATIVE
GLUCOSE, UA: NEGATIVE mg/dL
Ketones, ur: NEGATIVE mg/dL
Nitrite: NEGATIVE
Protein, ur: 30 mg/dL — AB
SPECIFIC GRAVITY, URINE: 1.027 (ref 1.005–1.030)
UROBILINOGEN UA: 1 mg/dL (ref 0.0–1.0)
pH: 6.5 (ref 5.0–8.0)

## 2015-03-28 LAB — POC URINE PREG, ED: Preg Test, Ur: NEGATIVE

## 2015-03-28 MED ORDER — NAPROXEN 500 MG PO TABS
500.0000 mg | ORAL_TABLET | Freq: Once | ORAL | Status: AC
  Administered 2015-03-29: 500 mg via ORAL
  Filled 2015-03-28: qty 1

## 2015-03-28 MED ORDER — METHOCARBAMOL 500 MG PO TABS
500.0000 mg | ORAL_TABLET | Freq: Once | ORAL | Status: AC
  Administered 2015-03-29: 500 mg via ORAL
  Filled 2015-03-28: qty 1

## 2015-03-28 NOTE — ED Notes (Signed)
Writer had an unsuccessful attempt blood work

## 2015-03-28 NOTE — ED Notes (Addendum)
Pt c/o pain from menstrual cramps since last night.  Pt has been seen multiple times for same.  Pain since last night.  Denies NVD.  Period started this morning.

## 2015-03-28 NOTE — ED Notes (Signed)
unsuccessful lad draw, not enough blood in return.

## 2015-03-29 MED ORDER — NAPROXEN 500 MG PO TABS: 500.0000 mg | ORAL_TABLET | Freq: Two times a day (BID) | ORAL | Status: DC

## 2015-03-29 MED ORDER — TRAMADOL HCL 50 MG PO TABS
100.0000 mg | ORAL_TABLET | Freq: Once | ORAL | Status: AC
  Administered 2015-03-29: 100 mg via ORAL
  Filled 2015-03-29: qty 2

## 2015-03-29 MED ORDER — METHOCARBAMOL 750 MG PO TABS: 750.0000 mg | ORAL_TABLET | Freq: Four times a day (QID) | ORAL | Status: DC | PRN

## 2015-03-29 NOTE — ED Notes (Signed)
Pt left without discharge papers,  Stated she would be back if not feeling any better

## 2015-03-29 NOTE — Discharge Instructions (Signed)
It is recommended that you seek a second opinion for your ongoing dysmenorrhea.  Take medications as prescribed.   Dysmenorrhea Menstrual cramps (dysmenorrhea) are caused by the muscles of the uterus tightening (contracting) during a menstrual period. For some women, this discomfort is merely bothersome. For others, dysmenorrhea can be severe enough to interfere with everyday activities for a few days each month. Primary dysmenorrhea is menstrual cramps that last a couple of days when you start having menstrual periods or soon after. This often begins after a teenager starts having her period. As a woman gets older or has a baby, the cramps will usually lessen or disappear. Secondary dysmenorrhea begins later in life, lasts longer, and the pain may be stronger than primary dysmenorrhea. The pain may start before the period and last a few days after the period.  CAUSES  Dysmenorrhea is usually caused by an underlying problem, such as:  The tissue lining the uterus grows outside of the uterus in other areas of the body (endometriosis).  The endometrial tissue, which normally lines the uterus, is found in or grows into the muscular walls of the uterus (adenomyosis).  The pelvic blood vessels are engorged with blood just before the menstrual period (pelvic congestive syndrome).  Overgrowth of cells (polyps) in the lining of the uterus or cervix.  Falling down of the uterus (prolapse) because of loose or stretched ligaments.  Depression.  Bladder problems, infection, or inflammation.  Problems with the intestine, a tumor, or irritable bowel syndrome.  Cancer of the female organs or bladder.  A severely tipped uterus.  A very tight opening or closed cervix.  Noncancerous tumors of the uterus (fibroids).  Pelvic inflammatory disease (PID).  Pelvic scarring (adhesions) from a previous surgery.  Ovarian cyst.  An intrauterine device (IUD) used for birth control. RISK FACTORS You may  be at greater risk of dysmenorrhea if:  You are younger than age 29.  You started puberty early.  You have irregular or heavy bleeding.  You have never given birth.  You have a family history of this problem.  You are a smoker. SIGNS AND SYMPTOMS   Cramping or throbbing pain in your lower abdomen.  Headaches.  Lower back pain.  Nausea or vomiting.  Diarrhea.  Sweating or dizziness.  Loose stools. DIAGNOSIS  A diagnosis is based on your history, symptoms, physical exam, diagnostic tests, or procedures. Diagnostic tests or procedures may include:  Blood tests.  Ultrasonography.  An examination of the lining of the uterus (dilation and curettage, D&C).  An examination inside your abdomen or pelvis with a scope (laparoscopy).  X-rays.  CT scan.  MRI.  An examination inside the bladder with a scope (cystoscopy).  An examination inside the intestine or stomach with a scope (colonoscopy, gastroscopy). TREATMENT  Treatment depends on the cause of the dysmenorrhea. Treatment may include:  Pain medicine prescribed by your health care provider.  Birth control pills or an IUD with progesterone hormone in it.  Hormone replacement therapy.  Nonsteroidal anti-inflammatory drugs (NSAIDs). These may help stop the production of prostaglandins.  Surgery to remove adhesions, endometriosis, ovarian cyst, or fibroids.  Removal of the uterus (hysterectomy).  Progesterone shots to stop the menstrual period.  Cutting the nerves on the sacrum that go to the female organs (presacral neurectomy).  Electric current to the sacral nerves (sacral nerve stimulation).  Antidepressant medicine.  Psychiatric therapy, counseling, or group therapy.  Exercise and physical therapy.  Meditation and yoga therapy.  Acupuncture. HOME CARE INSTRUCTIONS  Only take over-the-counter or prescription medicines as directed by your health care provider.  Place a heating pad or hot  water bottle on your lower back or abdomen. Do not sleep with the heating pad.  Use aerobic exercises, walking, swimming, biking, and other exercises to help lessen the cramping.  Massage to the lower back or abdomen may help.  Stop smoking.  Avoid alcohol and caffeine. SEEK MEDICAL CARE IF:   Your pain does not get better with medicine.  You have pain with sexual intercourse.  Your pain increases and is not controlled with medicines.  You have abnormal vaginal bleeding with your period.  You develop nausea or vomiting with your period that is not controlled with medicine. SEEK IMMEDIATE MEDICAL CARE IF:  You pass out.  Document Released: 10/11/2005 Document Revised: 06/13/2013 Document Reviewed: 03/29/2013 [facility] Patient Information 2015 [place], [place]. This information is not intended to replace advice given to you by your health care provider. Make sure you discuss any questions you have with your health care provider.

## 2015-03-29 NOTE — ED Notes (Signed)
Pt refusing to leave until she speaks with Dr. Sharol Given. Pt states she does not feel any better since receiving medications. Pt very upset, Pt states she has taken ultram without relief and wants something stronger for pain so she can go home and go to sleep. Discussed with patient f/u with GYN, pt states she can not take off work for recommended hysterectomy. Pt offered Valium, pt refused. Dr. Sharol Given made attempts to explain to patient cramping of uterus is causing pain, pt very upset,stating she was not crazy she was not taking Valium. Pt stating she pays her insurance to be able to come to hospital for pain issues. Pt advised she was not going to be receiving narcotics, pt then agreed to receive Ultram before leaving. Pt states he boyfriend will be driving her home.  Pt has steady, quick gait to BR after conversation.

## 2015-03-29 NOTE — ED Notes (Signed)
Pt left prior to discharge papers being received,  Pt was ambulatory

## 2015-03-29 NOTE — ED Provider Notes (Addendum)
CSN: [id number]     Arrival date & time 03/28/15  1646 History   First MD Initiated Contact with Patient 03/28/15 2300     Chief Complaint  Patient presents with  . Menstrual Problem     (Consider location/radiation/quality/duration/timing/severity/associated sxs/prior Treatment) HPI 49 year old female presents to emergency department with complaint of lower abdominal cramping.  Patient reports that she recently finished her menstrual cycle of 7 days, had been off for 3 days and restarted.  Patient reports that she has had long history of dysmenorrhea since age 23.  She reports that she has managed cramps for the last several months with marijuana.  She reports that she has been recommended a hysterectomy by her gynecologist, but cannot take off work.  She reports that she has taken multiple doses of Ultram and Cipro today without improvement in symptoms.  She denies any fever or chills.  She denies any dysuria.  She is not sexually active. Past Medical History  Diagnosis Date  . Hypertension   . Dysmenorrhea    Past Surgical History  Procedure Laterality Date  . Gallbladder surgery  2011  . Hand surgery  20 years ago    surgery due to infection per pt.   Family History  Problem Relation Age of Onset  . Adopted: Yes   History  Substance Use Topics  . Smoking status: Never Smoker   . Smokeless tobacco: Not on file  . Alcohol Use: No     Comment: LAST TIME-   2 MTHS AGO   OB History    Gravida Para Term Preterm AB TAB SAB Ectopic Multiple Living   2    2  2         Review of Systems  Genitourinary: Positive for vaginal bleeding, menstrual problem and pelvic pain. Negative for dysuria and difficulty urinating.      Allergies  Ciprofloxacin  Home Medications   Prior to Admission medications   Medication Sig Start Date End Date Taking? Authorizing Provider  cloNIDine (CATAPRES) 0.3 MG tablet Take 1 tablet (0.3 mg total) by mouth 3 (three) times daily. 11/11/14  Yes Robyn Haber, MD  ibuprofen (ADVIL,MOTRIN) 200 MG tablet Take 1,000-1,200 mg by mouth every 6 (six) hours as needed.   Yes Historical Provider, MD  cephALEXin (KEFLEX) 500 MG capsule Take 1 capsule (500 mg total) by mouth 4 (four) times daily. Patient not taking: Reported on 03/28/2015 12/15/14   Pamella Pert, MD  oxyCODONE-acetaminophen (PERCOCET) 5-325 MG per tablet Take 1-2 tablets by mouth every 6 (six) hours as needed. Patient not taking: Reported on 03/28/2015 12/15/14   Pamella Pert, MD  traMADol (ULTRAM) 50 MG tablet Take 2 tablets (100 mg total) by mouth every 6 (six) hours as needed for moderate pain. Patient not taking: Reported on 03/28/2015 11/23/14   Orlie Dakin, MD  traMADol (ULTRAM) 50 MG tablet Take 1 tablet (50 mg total) by mouth every 6 (six) hours as needed. Patient not taking: Reported on 12/15/2014 11/23/14   Orlie Dakin, MD   BP 166/87 mmHg  Pulse 73  Temp(Src) 98.7 F (37.1 C) (Oral)  Resp 14  SpO2 100%  LMP 03/28/2015 Physical Exam  Constitutional: She is oriented to person, place, and time. She appears well-developed and well-nourished.  HENT:  Head: Normocephalic and atraumatic.  Nose: Nose normal.  Mouth/Throat: Oropharynx is clear and moist.  Eyes: Conjunctivae and EOM are normal. Pupils are equal, round, and reactive to light.  Neck: Normal range of motion. Neck supple. No  JVD present. No tracheal deviation present. No thyromegaly present.  Cardiovascular: Normal rate, regular rhythm, normal heart sounds and intact distal pulses.  Exam reveals no gallop and no friction rub.   No murmur heard. Pulmonary/Chest: Effort normal and breath sounds normal. No stridor. No respiratory distress. She has no wheezes. She has no rales. She exhibits no tenderness.  Abdominal: Soft. Bowel sounds are normal. She exhibits no distension and no mass. There is tenderness (tender to palpation suprapubic region.). There is no rebound and no guarding.  Musculoskeletal: Normal  range of motion. She exhibits no edema or tenderness.  Lymphadenopathy:    She has no cervical adenopathy.  Neurological: She is alert and oriented to person, place, and time. She displays normal reflexes. She exhibits normal muscle tone. Coordination normal.  Skin: Skin is warm and dry. No rash noted. No erythema. No pallor.  Psychiatric: She has a normal mood and affect. Her behavior is normal. Judgment and thought content normal.  Nursing note and vitals reviewed.   ED Course  Procedures (including critical care time) Labs Review Labs Reviewed  URINALYSIS, ROUTINE W REFLEX MICROSCOPIC (NOT AT [facility]) - Abnormal; Notable for the following:    Color, Urine AMBER (*)    APPearance CLOUDY (*)    Hgb urine dipstick LARGE (*)    Protein, ur 30 (*)    Leukocytes, UA SMALL (*)    All other components within normal limits  URINE MICROSCOPIC-ADD ON - Abnormal; Notable for the following:    Squamous Epithelial / LPF FEW (*)    Bacteria, UA FEW (*)    All other components within normal limits  CBC WITH DIFFERENTIAL/PLATELET  COMPREHENSIVE METABOLIC PANEL  LIPASE, BLOOD  POC URINE PREG, ED    Imaging Review No results found.   EKG Interpretation None      MDM   Final diagnoses:  Dysmenorrhea    49 year old female with dysmenorrhea, history of same, has been seen by gynecology recommends hysterectomy.  Counseled the patient that the emergency department can only provide short-term relief.  That she will need follow-up with a primary care doctor and possible second opinion from another gynecologist for further management of her ongoing symptoms.  I have offered her Robaxin and Naprosyn.    Linton Flemings, MD 03/29/15 0014  1:02 AM Call back the patient's room as she is refusing to be discharged.  She reports that she feels no better after the Robaxin and Naprosyn.  She is demanding something stronger for pain.  She reports that she is not a pill head.  She reports that she pays her  insurance and expects me to give her something stronger for her menstrual cramps.  We had a prolonged discussion over that this is a chronic issue and she has been told in the past that we cannot prescribe her ongoing narcotic prescriptions.  I have offered her Valium, and she refuses that.  She says that she does not have a psychiatric issue.  I've tried to explain to her that I am offering it as a muscle relaxant and not a psychiatric problem.  Patient refuses this as well.  She demands a dose of Ultram.  This will be given and she'll be discharged home.  Linton Flemings, MD 03/29/15 [phone]

## 2015-04-12 ENCOUNTER — Encounter (HOSPITAL_COMMUNITY): Payer: Self-pay

## 2015-04-12 ENCOUNTER — Emergency Department (INDEPENDENT_AMBULATORY_CARE_PROVIDER_SITE_OTHER)
Admission: EM | Admit: 2015-04-12 | Discharge: 2015-04-12 | Disposition: A | Discharge status: Home / Self Care | Payer: PRIVATE HEALTH INSURANCE | Source: Home / Self Care | Attending: Family Medicine | Admitting: Family Medicine

## 2015-04-12 DIAGNOSIS — N921 Excessive and frequent menstruation with irregular cycle: Secondary | ICD-10-CM

## 2015-04-12 MED ORDER — TRAMADOL HCL 50 MG PO TABS: 100.0000 mg | ORAL_TABLET | Freq: Four times a day (QID) | ORAL | Status: DC | PRN

## 2015-04-12 NOTE — ED Provider Notes (Signed)
CSN: [id number]     Arrival date & time 04/12/15  1702 History   First MD Initiated Contact with Patient 04/12/15 1743     Chief Complaint  Patient presents with  . Metrorrhagia   (Consider location/radiation/quality/duration/timing/severity/associated sxs/prior Treatment) HPI  49 year old female presents today to Urgent Care for lower abdominal cramping. States she has been having menstrual cycle for "past 3 months."  Followed by outpt gynecologist, has upcoming appointment on Monday.  Managed with Tramadol for pain relief and intermittent Ibuprofen.  She has scheduled hysterectomy early August.  Describes lower abdominal cramping.  Light bleeding or spotting most days.  No lightheadedness.   Patient reports that she has had long history of dysmenorrhea since age 89.  She denies any fever or chills. She denies any dysuria. She is not sexually active.  Eating and drinking well.  No nausea or vomiting.  Good bowel movements.   She had called her doctor's appt for Tramadol refill on Friday, but physician had already left.  Has FU already scheduled on Monday for refill of Tramadol.     Past Medical History  Diagnosis Date  . Hypertension   . Dysmenorrhea    Past Surgical History  Procedure Laterality Date  . Gallbladder surgery  2011  . Hand surgery  20 years ago    surgery due to infection per pt.   Family History  Problem Relation Age of Onset  . Adopted: Yes   History  Substance Use Topics  . Smoking status: Never Smoker   . Smokeless tobacco: Not on file  . Alcohol Use: No     Comment: LAST TIME-   2 MTHS AGO   OB History    Gravida Para Term Preterm AB TAB SAB Ectopic Multiple Living   2    2  2         Review of Systems  Allergies  Ciprofloxacin  Home Medications   Prior to Admission medications   Medication Sig Start Date End Date Taking? Authorizing Provider  cloNIDine (CATAPRES) 0.3 MG tablet Take 1 tablet (0.3 mg total) by mouth 3 (three) times daily. 11/11/14    Robyn Haber, MD  methocarbamol (ROBAXIN-750) 750 MG tablet Take 1 tablet (750 mg total) by mouth every 6 (six) hours as needed for muscle spasms (menstrual cramps). 03/29/15   Linton Flemings, MD  naproxen (NAPROSYN) 500 MG tablet Take 1 tablet (500 mg total) by mouth 2 (two) times daily with a meal. 03/29/15   Linton Flemings, MD  traMADol (ULTRAM) 50 MG tablet Take 2 tablets (100 mg total) by mouth every 6 (six) hours as needed. 04/12/15   Alveda Reasons, MD   BP 134/90 mmHg  Pulse 99  Temp(Src) 98.9 F (37.2 C) (Oral)  Resp 16  SpO2 99%  LMP 03/28/2015 Physical Exam  Gen:  Alert, cooperative patient who appears stated age in no acute distress.  Vital signs reviewed. HEENT:  [place]/AT.  EOMI, PERRL.  MMM, tonsils non-erythematous, non-edematous.  External ears WNL, Bilateral TM's normal without retraction, redness or bulging.  Cardiac:  Regular rate and rhythm without murmur auscultated.  Good S1/S2. Pulm:  Clear to auscultation bilaterally with good air movement.  No wheezes or rales noted.  . Abd:  Soft/ND.  TTP BL lower quadrants, very mild.  No guarding or rebound.  Good bowel sounds throughout.   ED Course  Procedures (including critical care time) Labs Review Labs Reviewed - No data to display  Imaging Review No results found.  MDM   1. Metrorrhagia    - refill for Ultram today. Provide enough until appt on Monday     Alveda Reasons, MD 04/12/15 [phone]

## 2015-04-12 NOTE — ED Notes (Signed)
C/o has had problems w her menses all her life, and this menses wont stop. Has reportedly had bleeding x 3 weeks. States she has surgery for hysterectomy scheduled 05-28-2015

## 2015-04-12 NOTE — Discharge Instructions (Signed)
Take the Tramadol 2 pills every 6-8 hours as needed for menstrual cramps.  Follow up with your gynecologist at your appointment on Monday.    Come back to see [place] if your symptoms worsen.

## 2015-04-18 ENCOUNTER — Other Ambulatory Visit: Payer: Self-pay | Admitting: Obstetrics & Gynecology

## 2015-04-30 ENCOUNTER — Encounter (HOSPITAL_COMMUNITY)
Admission: RE | Admit: 2015-04-30 | Discharge: 2015-04-30 | Disposition: A | Discharge status: Home / Self Care | Payer: PRIVATE HEALTH INSURANCE | Source: Ambulatory Visit | Attending: Obstetrics & Gynecology | Admitting: Obstetrics & Gynecology

## 2015-04-30 ENCOUNTER — Encounter (HOSPITAL_COMMUNITY): Payer: Self-pay

## 2015-04-30 DIAGNOSIS — N938 Other specified abnormal uterine and vaginal bleeding: Secondary | ICD-10-CM | POA: Diagnosis not present

## 2015-04-30 DIAGNOSIS — Z79899 Other long term (current) drug therapy: Secondary | ICD-10-CM | POA: Diagnosis not present

## 2015-04-30 DIAGNOSIS — I10 Essential (primary) hypertension: Secondary | ICD-10-CM | POA: Diagnosis not present

## 2015-04-30 DIAGNOSIS — Z01818 Encounter for other preprocedural examination: Secondary | ICD-10-CM | POA: Diagnosis not present

## 2015-04-30 DIAGNOSIS — N840 Polyp of corpus uteri: Secondary | ICD-10-CM | POA: Diagnosis not present

## 2015-04-30 DIAGNOSIS — G8929 Other chronic pain: Secondary | ICD-10-CM | POA: Diagnosis not present

## 2015-04-30 LAB — CBC
HCT: 36.3 % (ref 36.0–46.0)
Hemoglobin: 11.9 g/dL — ABNORMAL LOW (ref 12.0–15.0)
MCH: 28.3 pg (ref 26.0–34.0)
MCHC: 32.8 g/dL (ref 30.0–36.0)
MCV: 86.4 fL (ref 78.0–100.0)
PLATELETS: 261 10*3/uL (ref 150–400)
RBC: 4.2 MIL/uL (ref 3.87–5.11)
RDW: 15.4 % (ref 11.5–15.5)
WBC: 5.8 10*3/uL (ref 4.0–10.5)

## 2015-04-30 LAB — BASIC METABOLIC PANEL
ANION GAP: 5 (ref 5–15)
BUN: 8 mg/dL (ref 6–20)
CHLORIDE: 108 mmol/L (ref 101–111)
CO2: 24 mmol/L (ref 22–32)
Calcium: 9.1 mg/dL (ref 8.9–10.3)
Creatinine, Ser: 0.82 mg/dL (ref 0.44–1.00)
GFR calc non Af Amer: >60 mL/min (ref >60)
Glucose, Bld: 97 mg/dL (ref 65–99)
Potassium: 3.5 mmol/L (ref 3.5–5.1)
Sodium: 137 mmol/L (ref 135–145)

## 2015-04-30 LAB — TYPE AND SCREEN
ABO/RH(D): O POS
Antibody Screen: NEGATIVE

## 2015-04-30 LAB — ABO/RH: ABO/RH(D): O POS

## 2015-04-30 NOTE — Patient Instructions (Signed)
Your procedure is scheduled on:05/08/15  Enter through the Main Entrance at :9am Pick up desk phone and dial [phone] and inform us of your arrival.  Please call [phone] if you have any problems the morning of surgery.  Remember: Do not eat food or drink liquids, including water, after midnight:WED.    You may brush your teeth the morning of surgery.  Take these meds the morning of surgery with a sip of water: Clonidine  DO NOT wear jewelry, eye make-up, lipstick,body lotion, or dark fingernail polish.  (Polished toes are ok) You may wear deodorant.  If you are to be admitted after surgery, leave suitcase in car until your room has been assigned. Patients discharged on the day of surgery will not be allowed to drive home. Wear loose fitting, comfortable clothes for your ride home.

## 2015-04-30 NOTE — Pre-Procedure Instructions (Signed)
Pt states that she has a hard time with IVs being started. I had Lucy Antigua, RN evaluate pt's veins for out patient surgery, and Izora Gala feels that pt has veins adequate for IV start.

## 2015-05-08 ENCOUNTER — Ambulatory Visit (HOSPITAL_COMMUNITY): Payer: PRIVATE HEALTH INSURANCE | Admitting: Anesthesiology

## 2015-05-08 ENCOUNTER — Encounter (HOSPITAL_COMMUNITY)
Admission: RE | Disposition: A | Discharge status: Home / Self Care | Payer: Self-pay | Source: Ambulatory Visit | Attending: Obstetrics & Gynecology

## 2015-05-08 ENCOUNTER — Ambulatory Visit (HOSPITAL_COMMUNITY)
Admission: RE | Admit: 2015-05-08 | Discharge: 2015-05-08 | Disposition: A | Discharge status: Home / Self Care | Payer: PRIVATE HEALTH INSURANCE | Source: Ambulatory Visit | Attending: Obstetrics & Gynecology | Admitting: Obstetrics & Gynecology

## 2015-05-08 ENCOUNTER — Encounter (HOSPITAL_COMMUNITY): Payer: Self-pay | Admitting: Anesthesiology

## 2015-05-08 DIAGNOSIS — I10 Essential (primary) hypertension: Secondary | ICD-10-CM | POA: Insufficient documentation

## 2015-05-08 DIAGNOSIS — G8929 Other chronic pain: Secondary | ICD-10-CM | POA: Insufficient documentation

## 2015-05-08 DIAGNOSIS — Z79899 Other long term (current) drug therapy: Secondary | ICD-10-CM | POA: Insufficient documentation

## 2015-05-08 DIAGNOSIS — N840 Polyp of corpus uteri: Secondary | ICD-10-CM | POA: Insufficient documentation

## 2015-05-08 HISTORY — PX: DILATATION & CURRETTAGE/HYSTEROSCOPY WITH RESECTOCOPE: SHX5572

## 2015-05-08 HISTORY — PX: NOVASURE ABLATION: SHX5394

## 2015-05-08 LAB — PREGNANCY, URINE: Preg Test, Ur: NEGATIVE

## 2015-05-08 SURGERY — DILATATION & CURETTAGE/HYSTEROSCOPY WITH RESECTOCOPE
Anesthesia: General | Site: Uterus

## 2015-05-08 MED ORDER — MIDAZOLAM HCL 2 MG/2ML IJ SOLN
INTRAMUSCULAR | Status: AC
  Filled 2015-05-08: qty 2

## 2015-05-08 MED ORDER — SCOPOLAMINE 1 MG/3DAYS TD PT72: 1.0000 | MEDICATED_PATCH | Freq: Once | TRANSDERMAL | Status: DC

## 2015-05-08 MED ORDER — FENTANYL CITRATE (PF) 100 MCG/2ML IJ SOLN
INTRAMUSCULAR | Status: AC
  Administered 2015-05-08: 50 ug via INTRAVENOUS
  Filled 2015-05-08: qty 2

## 2015-05-08 MED ORDER — ONDANSETRON HCL 4 MG/2ML IJ SOLN
INTRAMUSCULAR | Status: AC
  Filled 2015-05-08: qty 2

## 2015-05-08 MED ORDER — FENTANYL CITRATE (PF) 100 MCG/2ML IJ SOLN
INTRAMUSCULAR | Status: AC
  Filled 2015-05-08: qty 2

## 2015-05-08 MED ORDER — PROPOFOL 10 MG/ML IV BOLUS
INTRAVENOUS | Status: DC | PRN
  Administered 2015-05-08: 50 mg via INTRAVENOUS
  Administered 2015-05-08: 150 mg via INTRAVENOUS

## 2015-05-08 MED ORDER — MIDAZOLAM HCL 2 MG/2ML IJ SOLN
INTRAMUSCULAR | Status: DC | PRN
  Administered 2015-05-08: 2 mg via INTRAVENOUS

## 2015-05-08 MED ORDER — LIDOCAINE HCL 1 % IJ SOLN
INTRAMUSCULAR | Status: AC
  Filled 2015-05-08: qty 20

## 2015-05-08 MED ORDER — LIDOCAINE HCL (CARDIAC) 20 MG/ML IV SOLN
INTRAVENOUS | Status: AC
  Filled 2015-05-08: qty 5

## 2015-05-08 MED ORDER — TRAMADOL HCL 50 MG PO TABS: 100.0000 mg | ORAL_TABLET | Freq: Four times a day (QID) | ORAL | Status: DC | PRN

## 2015-05-08 MED ORDER — ONDANSETRON HCL 4 MG/2ML IJ SOLN
INTRAMUSCULAR | Status: DC | PRN
  Administered 2015-05-08: 4 mg via INTRAVENOUS

## 2015-05-08 MED ORDER — FENTANYL CITRATE (PF) 100 MCG/2ML IJ SOLN
25.0000 ug | INTRAMUSCULAR | Status: DC | PRN
  Administered 2015-05-08 (×3): 50 ug via INTRAVENOUS

## 2015-05-08 MED ORDER — PROPOFOL 10 MG/ML IV BOLUS
INTRAVENOUS | Status: AC
  Filled 2015-05-08: qty 20

## 2015-05-08 MED ORDER — KETOROLAC TROMETHAMINE 30 MG/ML IJ SOLN
INTRAMUSCULAR | Status: DC | PRN
  Administered 2015-05-08: 30 mg via INTRAVENOUS

## 2015-05-08 MED ORDER — DEXAMETHASONE SODIUM PHOSPHATE 4 MG/ML IJ SOLN
INTRAMUSCULAR | Status: AC
  Filled 2015-05-08: qty 1

## 2015-05-08 MED ORDER — KETOROLAC TROMETHAMINE 30 MG/ML IJ SOLN: 30.0000 mg | Freq: Once | INTRAMUSCULAR | Status: DC | PRN

## 2015-05-08 MED ORDER — 0.9 % SODIUM CHLORIDE (POUR BTL) OPTIME
TOPICAL | Status: DC | PRN
  Administered 2015-05-08: 1000 mL

## 2015-05-08 MED ORDER — NAPROXEN 500 MG PO TABS: 500.0000 mg | ORAL_TABLET | Freq: Two times a day (BID) | ORAL | Status: DC

## 2015-05-08 MED ORDER — LACTATED RINGERS IR SOLN
Status: DC | PRN
  Administered 2015-05-08: 3000 mL

## 2015-05-08 MED ORDER — LIDOCAINE HCL (CARDIAC) 20 MG/ML IV SOLN
INTRAVENOUS | Status: DC | PRN
  Administered 2015-05-08: 100 mg via INTRAVENOUS

## 2015-05-08 MED ORDER — LACTATED RINGERS IV SOLN
INTRAVENOUS | Status: DC
  Administered 2015-05-08 (×2): via INTRAVENOUS

## 2015-05-08 MED ORDER — MEPERIDINE HCL 25 MG/ML IJ SOLN: 6.2500 mg | INTRAMUSCULAR | Status: DC | PRN

## 2015-05-08 MED ORDER — FENTANYL CITRATE (PF) 100 MCG/2ML IJ SOLN
INTRAMUSCULAR | Status: DC | PRN
  Administered 2015-05-08 (×3): 100 ug via INTRAVENOUS

## 2015-05-08 MED ORDER — LIDOCAINE HCL 1 % IJ SOLN
INTRAMUSCULAR | Status: DC | PRN
  Administered 2015-05-08: 10 mL

## 2015-05-08 MED ORDER — ONDANSETRON HCL 4 MG/2ML IJ SOLN: 4.0000 mg | Freq: Once | INTRAMUSCULAR | Status: DC | PRN

## 2015-05-08 MED ORDER — DEXAMETHASONE SODIUM PHOSPHATE 10 MG/ML IJ SOLN
INTRAMUSCULAR | Status: DC | PRN
  Administered 2015-05-08: 4 mg via INTRAVENOUS

## 2015-05-08 MED ORDER — GLYCINE 1.5 % IR SOLN
Status: DC | PRN
  Administered 2015-05-08: 1

## 2015-05-08 SURGICAL SUPPLY — 17 items
ABLATOR ENDOMETRIAL BIPOLAR (ABLATOR) ×4 IMPLANT
CANISTER SUCT 3000ML (MISCELLANEOUS) ×4 IMPLANT
CATH ROBINSON RED A/P 16FR (CATHETERS) ×4 IMPLANT
CLOTH BEACON ORANGE TIMEOUT ST (SAFETY) ×4 IMPLANT
CONTAINER PREFILL 10% NBF 60ML (FORM) ×4 IMPLANT
GLOVE BIO SURGEON STRL SZ 6.5 (GLOVE) ×3 IMPLANT
GLOVE BIO SURGEONS STRL SZ 6.5 (GLOVE) ×1
GLOVE BIOGEL PI IND STRL 7.0 (GLOVE) ×4 IMPLANT
GLOVE BIOGEL PI INDICATOR 7.0 (GLOVE) ×4
GOWN STRL REUS W/TWL LRG LVL3 (GOWN DISPOSABLE) ×8 IMPLANT
LOOP ANGLED CUTTING 22FR (CUTTING LOOP) ×4 IMPLANT
PACK VAGINAL MINOR WOMEN LF (CUSTOM PROCEDURE TRAY) ×4 IMPLANT
PAD OB MATERNITY 4.3X12.25 (PERSONAL CARE ITEMS) ×4 IMPLANT
TOWEL OR 17X24 6PK STRL BLUE (TOWEL DISPOSABLE) ×8 IMPLANT
TUBING AQUILEX INFLOW (TUBING) ×4 IMPLANT
TUBING AQUILEX OUTFLOW (TUBING) ×4 IMPLANT
WATER STERILE IRR 1000ML POUR (IV SOLUTION) ×4 IMPLANT

## 2015-05-08 NOTE — Discharge Instructions (Signed)
°  Post Anesthesia Home Care Instructions ° °Activity: °Get plenty of rest for the remainder of the day. A responsible adult should stay with you for 24 hours following the procedure.  °For the next 24 hours, DO NOT: °-Drive a car °-Operate machinery °-Drink alcoholic beverages °-Take any medication unless instructed by your physician °-Make any legal decisions or sign important papers. ° °Meals: °Start with liquid foods such as gelatin or soup. Progress to regular foods as tolerated. Avoid greasy, spicy, heavy foods. If nausea and/or vomiting occur, drink only clear liquids until the nausea and/or vomiting subsides. Call your physician if vomiting continues. ° °Special Instructions/Symptoms: °Your throat may feel dry or sore from the anesthesia or the breathing tube placed in your throat during surgery. If this causes discomfort, gargle with warm salt water. The discomfort should disappear within 24 hours. ° °If you had a scopolamine patch placed behind your ear for the management of post- operative nausea and/or vomiting: ° °1. The medication in the patch is effective for 72 hours, after which it should be removed.  Wrap patch in a tissue and discard in the trash. Wash hands thoroughly with soap and water. °2. You may remove the patch earlier than 72 hours if you experience unpleasant side effects which may include dry mouth, dizziness or visual disturbances. °3. Avoid touching the patch. Wash your hands with soap and water after contact with the patch. °  °DISCHARGE INSTRUCTIONS: D&C / D&E °The following instructions have been prepared to help you care for yourself upon your return home. °  °Personal hygiene: °• Use sanitary pads for vaginal drainage, not tampons. °• Shower the day after your procedure. °• NO tub baths, pools or Jacuzzis for 2-3 weeks. °• Wipe front to back after using the bathroom. ° °Activity and limitations: °• Do NOT drive or operate any equipment for 24 hours. The effects of anesthesia are  still present and drowsiness may result. °• Do NOT rest in bed all day. °• Walking is encouraged. °• Walk up and down stairs slowly. °• You may resume your normal activity in one to two days or as indicated by your physician. ° °Sexual activity: NO intercourse for at least 2 weeks after the procedure, or as indicated by your physician. ° °Diet: Eat a light meal as desired this evening. You may resume your usual diet tomorrow. ° °Return to work: You may resume your work activities in one to two days or as indicated by your doctor. ° °What to expect after your surgery: Expect to have vaginal bleeding/discharge for 2-3 days and spotting for up to 10 days. It is not unusual to have soreness for up to 1-2 weeks. You may have a slight burning sensation when you urinate for the first day. Mild cramps may continue for a couple of days. You may have a regular period in 2-6 weeks. ° °Call your doctor for any of the following: °• Excessive vaginal bleeding, saturating and changing one pad every hour. °• Inability to urinate 6 hours after discharge from hospital. °• Pain not relieved by pain medication. °• Fever of 100.4° F or greater. °• Unusual vaginal discharge or odor. ° ° Call for an appointment:  ° ° °Patient’s signature: ______________________ ° °Nurse’s signature ________________________ ° °Support person's signature_______________________ ° ° ° °

## 2015-05-08 NOTE — Brief Op Note (Signed)
05/08/2015  12:56 PM  PATIENT:  Regina Estes  49 y.o. female  PRE-OPERATIVE DIAGNOSIS:  Dysfunctional Uterine Bleeding  POST-OPERATIVE DIAGNOSIS:  Dysfunctional Uterine Bleeding  PROCEDURE:  Procedure(s): DILATATION & CURETTAGE/HYSTEROSCOPY WITH RESECTOCOPE (N/A) NOVASURE ABLATION (N/A)  SURGEON:  Surgeon(s) and Role:    * Sanjuana Kava, MD - Primary  PHYSICIAN ASSISTANT:   ASSISTANTS: none   ANESTHESIA:   general and paracervical block  EBL:  Total I/O In: 1500 [I.V.:1500] Out: 610 [Urine:600; Blood:10]  BLOOD ADMINISTERED:none  DRAINS: none   LOCAL MEDICATIONS USED:  LIDOCAINE  and Amount: 10 ml  SPECIMEN:  Source of Specimen:  endometrial curettings, endometrial polyp  DISPOSITION OF SPECIMEN:  PATHOLOGY  COUNTS:  YES  TOURNIQUET:  * No tourniquets in log *  DICTATION: .Note written in EPIC  PLAN OF CARE: Discharge to home after PACU  PATIENT DISPOSITION:  PACU - hemodynamically stable.   Delay start of Pharmacological VTE agent (>24hrs) due to surgical blood loss or risk of bleeding: not applicable  Tashyra Adduci STACIA

## 2015-05-08 NOTE — Anesthesia Procedure Notes (Signed)
Procedure Name: LMA Insertion Date/Time: 05/08/2015 10:45 AM Performed by: Jonna Munro Pre-anesthesia Checklist: Patient identified, Emergency Drugs available, Suction available, Patient being monitored and Timeout performed Patient Re-evaluated:Patient Re-evaluated prior to inductionOxygen Delivery Method: Circle system utilized Preoxygenation: Pre-oxygenation with 100% oxygen Intubation Type: IV induction LMA: LMA inserted LMA Size: 4.0 Placement Confirmation: positive ETCO2 and breath sounds checked- equal and bilateral Tube secured with: Tape Dental Injury: Teeth and Oropharynx as per pre-operative assessment

## 2015-05-08 NOTE — Transfer of Care (Signed)
Immediate Anesthesia Transfer of Care Note  Patient: Regina Estes  Procedure(s) Performed: Procedure(s): DILATATION & CURETTAGE/HYSTEROSCOPY WITH RESECTOCOPE (N/A) NOVASURE ABLATION (N/A)  Patient Location: PACU  Anesthesia Type:General  Level of Consciousness: awake, alert  and oriented  Airway & Oxygen Therapy: Patient Spontanous Breathing and Patient connected to nasal cannula oxygen  Post-op Assessment: Report given to RN and Post -op Vital signs reviewed and stable  Post vital signs: Reviewed and stable  Last Vitals:  Filed Vitals:   05/08/15 0923  BP: 150/100  Pulse:   Temp:   Resp:     Complications: No apparent anesthesia complications

## 2015-05-08 NOTE — Anesthesia Preprocedure Evaluation (Signed)
Anesthesia Evaluation  Patient identified by MRN, date of birth, ID band  Reviewed: Allergy & Precautions, H&P , NPO status , Patient's Chart, lab work & pertinent test results  Airway Mallampati: I  TM Distance: >3 FB Neck ROM: full    Dental  (+) Lower Dentures, Upper Dentures   Pulmonary neg pulmonary ROS,    Pulmonary exam normal       Cardiovascular hypertension, Pt. on medications Normal cardiovascular exam    Neuro/Psych negative neurological ROS  negative psych ROS   GI/Hepatic negative GI ROS, Neg liver ROS,   Endo/Other  negative endocrine ROS  Renal/GU negative Renal ROS     Musculoskeletal   Abdominal Normal abdominal exam  (+)   Peds  Hematology negative hematology ROS (+)   Anesthesia Other Findings   Reproductive/Obstetrics negative OB ROS                             Anesthesia Physical Anesthesia Plan  ASA: II  Anesthesia Plan: General   Post-op Pain Management:    Induction: Intravenous  Airway Management Planned: LMA  Additional Equipment:   Intra-op Plan:   Post-operative Plan:   Informed Consent: I have reviewed the patients History and Physical, chart, labs and discussed the procedure including the risks, benefits and alternatives for the proposed anesthesia with the patient or authorized representative who has indicated his/her understanding and acceptance.     Plan Discussed with: CRNA and Surgeon  Anesthesia Plan Comments:         Anesthesia Quick Evaluation

## 2015-05-08 NOTE — H&P (Signed)
Regina Estes is an 49 y.o. female G45P0 c/o bleeding daily for more than a month. She c/o heavy to light menses X 2 months now. C/o intense cramping with bleeding. She was interested in a hysterectomy, now she is not as she is worried about the time off needed for work.    Pertinent Gynecological History: Menses: flow is excessive with use of 5-6 pads or tampons on heaviest days Bleeding: dysfunctional uterine bleeding Contraception: none DES exposure: denies Blood transfusions: none Sexually transmitted diseases: no past history Previous GYN Procedures: D&C  Last mammogram: normal Date: 2015 Last pap: normal Date: 11/2014 OB History: G3, P0   Menstrual History: Menarche age: 72  No LMP recorded.    Past Medical History  Diagnosis Date  . Dysmenorrhea   . Hypertension     no meds currently    Past Surgical History  Procedure Laterality Date  . Gallbladder surgery  2011  . Hand surgery  20 years ago    surgery due to infection per pt.    Family History  Problem Relation Age of Onset  . Adopted: Yes    Social History:  reports that she has never smoked. She does not have any smokeless tobacco history on file. She reports that she does not drink alcohol or use illicit drugs.  Allergies:  Allergies  Allergen Reactions  . Ciprofloxacin Other (See Comments)    sores and blisters    Prescriptions prior to admission  Medication Sig Dispense Refill Last Dose  . cloNIDine (CATAPRES) 0.3 MG tablet Take 1 tablet (0.3 mg total) by mouth 3 (three) times daily. 90 tablet 11 05/08/2015 at 0630  . traMADol (ULTRAM) 50 MG tablet Take 2 tablets (100 mg total) by mouth every 6 (six) hours as needed. 15 tablet 0   . methocarbamol (ROBAXIN-750) 750 MG tablet Take 1 tablet (750 mg total) by mouth every 6 (six) hours as needed for muscle spasms (menstrual cramps). (Patient not taking: Reported on 05/08/2015) 40 tablet 0   . naproxen (NAPROSYN) 500 MG tablet Take 1 tablet (500 mg total) by  mouth 2 (two) times daily with a meal. (Patient not taking: Reported on 05/08/2015) 30 tablet 0     Review of Systems  Constitutional: Negative for fever and chills.  HENT: Negative for hearing loss.   Eyes: Negative for blurred vision and double vision.  Respiratory: Negative for cough and hemoptysis.   Cardiovascular: Negative for chest pain and palpitations.  Gastrointestinal: Negative for heartburn and nausea.  Genitourinary: Negative for dysuria and urgency.  Skin: Negative for itching and rash.  Neurological: Negative for dizziness, tingling and headaches.  Endo/Heme/Allergies: Negative for environmental allergies. Does not bruise/bleed easily.  Psychiatric/Behavioral: Negative for depression and suicidal ideas.    Blood pressure 150/100, pulse 85, temperature 97.9 F (36.6 C), temperature source Oral, resp. rate 20, SpO2 100 %. Physical Exam  Vitals reviewed. Constitutional: She is oriented to person, place, and time. She appears well-developed and well-nourished.  HENT:  Head: Normocephalic and atraumatic.  Neck: Normal range of motion.  Cardiovascular: Normal rate and regular rhythm.   Respiratory: Effort normal.  GI: Soft.  Genitourinary: Vagina normal.   Mons: no erythema, excoriation, atrophy, lesions, masses, swelling, tenderness, or vesicles/ ulcers and normal. Labia Majora: no erythema, excoriation, atrophy, discoloration, lesions, masses, swelling, tenderness, or vesicles/ ulcers and normal. Labia Minora: no erythema, excoriation, atrophy, discoloration, lesions, vesicles, masses, swelling, or tenderness and normal. Introitus: normal. Bartholin's Gland: normal. Vagina: no discharge, erythema, atrophy,  lesions, ulcers, swelling, masses, tenderness, prolapse, or blood present and normal. Cervix: no lesions, discharge, bleeding, or cervical motion tenderness and grossly normal. Uterus: midline, mobile, normal contour, no uterine prolapse, and tender; tenderness on fundus.  Urethral Meatus/ Urethra no discharge, masses, or tenderness and normal meatus and well supported urethra. Bladder: non-distended, non-tender, and no palpable mass. Adnexa/Parametria: no tenderness or mass palpable.  Musculoskeletal: Normal range of motion.  Neurological: She is alert and oriented to person, place, and time. She has normal reflexes.    Results for orders placed or performed during the hospital encounter of 05/08/15 (from the past 24 hour(s))  Pregnancy, urine     Status: None   Collection Time: 05/08/15  8:40 AM  Result Value Ref Range   Preg Test, Ur NEGATIVE NEGATIVE    No results found.  Assessment/Plan: 49 yo G3P0 with abnormal uterine bleeding and chronic pelvic pain We discussed the etiology of her AUB, including the likelihood that it is anovulatory bleeding, common in women mid 40's.  US reveals no large fibroids but does show possible endometrial polyp.   Pt was counseled on her treatment options to include expectant management, OCP use, Mirena IUD, D&C, ablation or hysterectomy.  The risks of the procedure were discussed including infection, bleeding, and uterine perforation with injury to abdominal/pelvic organs.  We also discussed the 15% chance of failure with further interventions needed in the future and 15-20% chance of amenorrhea associated with the procedure.  We discussed the need to avoid pregnancy in the future. We discussed the need for an endometrial tissue sample to ensure there is no hyperplasia or malignancy. Will perform a D&C at the time of the procedure.  To OR for diagnostic hysteroscopy D&C Novasure ablation.  She is aware that ablation may not alleviate her pelvic pain if it is due to other causes like adenomyosis.   Cipriano Millikan, [place] 05/08/2015, 9:43 AM

## 2015-05-08 NOTE — Op Note (Signed)
05/08/2015  1:00 PM  PATIENT:  Regina Estes  49 y.o. female  PRE-OPERATIVE DIAGNOSIS:  Dysfunctional Uterine Bleeding  POST-OPERATIVE DIAGNOSIS:  Dysfunctional Uterine Bleeding / endometrial polyp  PROCEDURE:  Procedure(s): DILATATION & CURETTAGE/HYSTEROSCOPY WITH RESECTOCOPE (N/A) NOVASURE ABLATION (N/A)  SURGEON:  Surgeon(s) and Role:    * Sanjuana Kava, MD - Primary  ASSISTANTS: none   ANESTHESIA:   local and general  EBL:  Total I/O In: 1500 [I.V.:1500] Out: 610 [Urine:600; Blood:10]  LOCAL MEDICATIONS USED:  LIDOCAINE  and Amount: 10 ml  SPECIMEN:  Source of Specimen:  Endometrial curettings / Endometrial polyp  DISPOSITION OF SPECIMEN:  PATHOLOGY  COUNTS:  YES  PATIENT DISPOSITION:  PACU - hemodynamically stable.   FINDINGS: Exam under anesthesia: Normal external vulva, vaginal vault normal uterus anteverted mobile, no palpable masses; Adnexa: No palpable masses  Hysteroscopy:  Endometrial polyp / fibroid visualized polypoid endometrium, both ostia visualized  COMPLICATIONS: None  DESCRIPTION OF OPERATION:  After appropriate consent, the patient was taken to the operating room and administered adequate general anesthesia. She was placed in the lithotomy position and Examination under anesthesia was performed with findings noted above.  Patient prepped and draped. The bladder was emptied.   A Graves operative speculum was placed in the vaginal. The anterior cervical lip was held down with a toothed tenaculum. The uterus and the cervix were sounded and measured 8 cm. Hysteroscopy was carried out with a 12 degree scope showing normal endocervical cavity. Endometrial cavity showed endometrial polyp. Polyp forceps were used to remove part of the polyp.  Sharp endometrial curettage was carried out yielding profuse curettings which were collected and sent for pathology examination. The hysteroscope was replaced and the polyp was still present.  The resectoscope was used to  removed the polyp / fibroid under direct visualization.  This was handed off to be sent to Pathology.   A NovaSure endometrial ablation procedure was then performed. The uterine cavity length was registered on the instrument panel. The array was deployed, introduced into the uterus, and the uterine cavity width was measured and entered on the instrument panel. The cavity assessment was successfully completed followed by initiation of the ablation cycle which was completed without difficulty. (Cavity width 3.5  Length 4 Power 77 Time: 90 sec)  All the vaginal instruments were removed. Repeat hysteroscopy showed adequate ablation of the endometrium. All the vaginal instruments were removed. The patient was returned to supine position, awake, and left the operating room in stable condition. There were no complications.  Regina Estes Regina Estes

## 2015-05-08 NOTE — Anesthesia Postprocedure Evaluation (Signed)
Anesthesia Post Note  Patient: Regina Estes  Procedure(s) Performed: Procedure(s) (LRB): DILATATION & CURETTAGE/HYSTEROSCOPY WITH RESECTOCOPE (N/A) NOVASURE ABLATION (N/A)  Anesthesia type: General  Patient location: PACU  Post pain: Pain level controlled  Post assessment: Post-op Vital signs reviewed  Last Vitals:  Filed Vitals:   05/08/15 1230  BP: 120/79  Pulse: 78  Temp:   Resp: 18    Post vital signs: Reviewed  Level of consciousness: sedated  Complications: No apparent anesthesia complications

## 2015-05-11 ENCOUNTER — Encounter (HOSPITAL_COMMUNITY): Payer: Self-pay | Admitting: Obstetrics & Gynecology

## 2015-06-10 ENCOUNTER — Other Ambulatory Visit: Payer: Self-pay | Admitting: Obstetrics & Gynecology

## 2015-06-10 ENCOUNTER — Ambulatory Visit: Payer: PRIVATE HEALTH INSURANCE | Admitting: Family

## 2015-06-16 ENCOUNTER — Telehealth: Payer: Self-pay

## 2015-06-16 ENCOUNTER — Encounter (HOSPITAL_COMMUNITY): Payer: Self-pay | Admitting: Emergency Medicine

## 2015-06-16 ENCOUNTER — Emergency Department (INDEPENDENT_AMBULATORY_CARE_PROVIDER_SITE_OTHER)
Admission: EM | Admit: 2015-06-16 | Discharge: 2015-06-16 | Disposition: A | Discharge status: Home / Self Care | Payer: PRIVATE HEALTH INSURANCE | Source: Home / Self Care | Attending: Emergency Medicine | Admitting: Emergency Medicine

## 2015-06-16 DIAGNOSIS — R109 Unspecified abdominal pain: Secondary | ICD-10-CM

## 2015-06-16 MED ORDER — HYDROCODONE-ACETAMINOPHEN 5-325 MG PO TABS: 2.0000 | ORAL_TABLET | ORAL | Status: DC | PRN

## 2015-06-16 MED ORDER — MELOXICAM 15 MG PO TABS: 15.0000 mg | ORAL_TABLET | Freq: Every day | ORAL | Status: DC

## 2015-06-16 NOTE — Telephone Encounter (Signed)
Pharm called to report that they had filled the pt's Rx for clonidine 0.3 mg with lisinopril 2.5 mg in error. Pt p/up a partial fill (due to having to order more) of the incorrect Rx on Friday night and then returned it on Saturday afternoon. The clonidine is dosed at TID so pt ended up taking 4 doses of the lisinopril before she questioned whether it was the right medication. The pt c/o SE of some diarrhea from the lisinopril, but no other more common SEs. Pharm stated that she wanted to check pt's BP but she was in a hurry to get to work and declined. Pt has since received the correct medication. I called pt to check on her to make sure diarrhea has subsided and if she knows how her BP is running, but had to leave message for her to CB if she is still having any problems. Dr Carlean Jews, Jul[place]ier

## 2015-06-16 NOTE — ED Notes (Signed)
Pt states she is suffering from menstrual cramping.  She is going on vacation tomorrow and does not want to have to go to the ED while in [place].  Pt stated "I can't keep taking 4 Tramadol a day"  When I questioned her about the number of Tramadol she takes a day, she corrected herself and said 2, sometimes 3 a day.

## 2015-06-16 NOTE — ED Provider Notes (Signed)
CSN: [id number]     Arrival date & time 06/16/15  1749 History   First MD Initiated Contact with Patient 06/16/15 1912     Chief Complaint  Patient presents with  . Abdominal Cramping   (Consider location/radiation/quality/duration/timing/severity/associated sxs/prior Treatment) HPI  She is a 49 year old woman here for abdominal cramps. She states she has a long history of abdominal cramping. She had an ablation a month ago which resolved the bleeding. She states the cramps are somewhat better since the ablation. She states she is going on vacation in 2 days and needs something for her cramps. She has been taking tramadol 2 tablets 3-4 times a day without improvement. She is unable to get into see her OB/GYN provider before leaving.  Past Medical History  Diagnosis Date  . Dysmenorrhea   . Hypertension     no meds currently   Past Surgical History  Procedure Laterality Date  . Gallbladder surgery  2011  . Hand surgery  20 years ago    surgery due to infection per pt.  . Dilatation & currettage/hysteroscopy with resectocope N/A 05/08/2015    Procedure: DILATATION & CURETTAGE/HYSTEROSCOPY WITH RESECTOCOPE;  Surgeon: Sanjuana Kava, MD;  Location: [facility];  Service: Gynecology;  Laterality: N/A;  . Novasure ablation N/A 05/08/2015    Procedure: Rebbeca Paul ABLATION;  Surgeon: Sanjuana Kava, MD;  Location: [facility];  Service: Gynecology;  Laterality: N/A;   Family History  Problem Relation Age of Onset  . Adopted: Yes   Social History  Substance Use Topics  . Smoking status: Never Smoker   . Smokeless tobacco: None  . Alcohol Use: No     Comment: LAST TIME-   2 MTHS AGO   OB History    Gravida Para Term Preterm AB TAB SAB Ectopic Multiple Living   2    2  2         Review of Systems As in history of present illness Allergies  Ciprofloxacin  Home Medications   Prior to Admission medications   Medication Sig Start Date End Date Taking? Authorizing Provider  cloNIDine (CATAPRES) 0.3 MG  tablet Take 1 tablet (0.3 mg total) by mouth 3 (three) times daily. 11/11/14  Yes Robyn Haber, MD  traMADol (ULTRAM) 50 MG tablet Take 2 tablets (100 mg total) by mouth every 6 (six) hours as needed. 05/08/15  Yes Sanjuana Kava, MD  HYDROcodone-acetaminophen (NORCO/VICODIN) 5-325 MG per tablet Take 2 tablets by mouth every 4 (four) hours as needed. 06/16/15   Melony Overly, MD  meloxicam (MOBIC) 15 MG tablet Take 1 tablet (15 mg total) by mouth daily. 06/16/15   Melony Overly, MD  naproxen (NAPROSYN) 500 MG tablet Take 1 tablet (500 mg total) by mouth 2 (two) times daily with a meal. 05/08/15   Sanjuana Kava, MD   BP 133/90 mmHg  Pulse 93  Temp(Src) 97.8 F (36.6 C) (Oral)  Resp 16  SpO2 100%  LMP 06/12/2015 (Exact Date) Physical Exam  Constitutional: She is oriented to person, place, and time. She appears well-developed and well-nourished. She appears distressed (mild).  Cardiovascular: Normal rate.   Pulmonary/Chest: Effort normal.  Neurological: She is alert and oriented to person, place, and time.    ED Course  Procedures (including critical care time) Labs Review Labs Reviewed - No data to display  Imaging Review No results found.   MDM   1. Abdominal cramping    Prescription given for meloxicam. Norco 5-325 milligrams, #10 tablets given. Follow-up with OB/GYN as  soon as possible.    Melony Overly, MD 06/16/15 2022

## 2015-06-16 NOTE — Discharge Instructions (Signed)
I'm sorry you are having cramping. Take meloxicam daily. This is to help with baseline pain. Use the hydrocodone every 6 hours as needed for severe pain. Follow-up with your OB/GYN doctor as soon as possible.

## 2015-07-23 ENCOUNTER — Ambulatory Visit (INDEPENDENT_AMBULATORY_CARE_PROVIDER_SITE_OTHER): Payer: PRIVATE HEALTH INSURANCE | Admitting: Family Medicine

## 2015-07-23 VITALS — BP 130/82 | HR 72 | Temp 98.6°F | Resp 18 | Ht 62.0 in | Wt 122.0 lb

## 2015-07-23 DIAGNOSIS — G894 Chronic pain syndrome: Secondary | ICD-10-CM

## 2015-07-23 NOTE — Progress Notes (Addendum)
Subjective:  This chart was scribed for Delman Cheadle, MD by [facility], medical scribe at [facility].The patient was seen in exam room 04 and the patient's care was started at 5:31 PM.   Patient ID: Regina Estes, female    DOB: [birth year], 49 y.o.   MRN: [id number] Chief Complaint  Patient presents with  . Medication Refill    ultram-needs pcp   HPI HPI Comments: Regina Estes is a 49 y.o. female who presents to [facility] for a medication refill for Ultram.  [place] drug database reviewed. She has been receiving pain med from her surgeon DR. Newman, 5 prescription from him this month. Prior to that shows prescription Dr. Sanjuana Kava from [place] [facility] resident and a wide variety of other physicians in [place] including [place], [place], [place], and [place]. She would like a referral to a pain management clinic. She has never been to a pain clinic before.  Past Medical History  Diagnosis Date  . Dysmenorrhea   . Hypertension     no meds currently   Current Outpatient Prescriptions on File Prior to Visit  Medication Sig Dispense Refill  . cloNIDine (CATAPRES) 0.3 MG tablet Take 1 tablet (0.3 mg total) by mouth 3 (three) times daily. 90 tablet 11  . traMADol (ULTRAM) 50 MG tablet Take 2 tablets (100 mg total) by mouth every 6 (six) hours as needed. 60 tablet 0  . HYDROcodone-acetaminophen (NORCO/VICODIN) 5-325 MG per tablet Take 2 tablets by mouth every 4 (four) hours as needed. (Patient not taking: Reported on 07/23/2015) 10 tablet 0  . meloxicam (MOBIC) 15 MG tablet Take 1 tablet (15 mg total) by mouth daily. (Patient not taking: Reported on 07/23/2015) 30 tablet 0  . naproxen (NAPROSYN) 500 MG tablet Take 1 tablet (500 mg total) by mouth 2 (two) times daily with a meal. (Patient not taking: Reported on 07/23/2015) 60 tablet 3   No current facility-administered medications on file prior to visit.   Allergies  Allergen Reactions  .  Ciprofloxacin Other (See Comments)    sores and blisters   Depression screen [facility] 2/9 07/23/2015  Decreased Interest 0  Down, Depressed, Hopeless 0  PHQ - 2 Score 0     Review of Systems  Gastrointestinal: Positive for abdominal pain.  Genitourinary: Positive for pelvic pain.  Musculoskeletal: Positive for myalgias, back pain and arthralgias.  Psychiatric/Behavioral: Negative for dysphoric mood.       Objective:  BP 130/82 mmHg  Pulse 72  Temp(Src) 98.6 F (37 C) (Oral)  Resp 18  Ht 5\' 2"  (1.575 m)  Wt 122 lb (55.339 kg)  BMI 22.31 kg/m2  SpO2 98%  LMP 06/25/2015 Physical Exam  Constitutional: She is oriented to person, place, and time. She appears well-developed and well-nourished. No distress.  HENT:  Head: Normocephalic and atraumatic.  Eyes: Pupils are equal, round, and reactive to light.  Neck: Normal range of motion.  Cardiovascular: Normal rate and regular rhythm.   Pulmonary/Chest: Effort normal. No respiratory distress.  Musculoskeletal: Normal range of motion.  Neurological: She is alert and oriented to person, place, and time.  Skin: Skin is warm and dry.  Psychiatric: She has a normal mood and affect. Her behavior is normal.  Nursing note and vitals reviewed.       Assessment & Plan:   1. Chronic pain syndrome    I utilized the [place] Controlled Substance Registry's online query to confirm compliance regarding  the patient's narcotic pain medications. My review reveals numerous opioid prescription fills from several different providers from mult cities/towns in [place] and even some from [place] filled at different pharmacies. When pt was pressed on who the other rxs were mostly from, she states that her chronic pain meds were being rxed by her prior gyn, surgeon, dentist who pt reports all told her to come to [facility] for refills on her routine meds while she is waiting to get into a PCP and a pain clinic - pt does not ave an appt to establish anywhere and denies ever being  seen at a prior pain clinic. Patient was made aware of my use of the system. I declined to refill - pt wanting temporary rx but advised pt that if she continues to get rxs from different doctors such as myself it will make it increasingly difficult if not impossible to get in anywhere to establish as it will appear she is doctor shopping. Recommended to pt that she get her prior recs from al physicians to bring to new clinic and she needs letter from prior rx validating her statements.  Orders Placed This Encounter  Procedures  . Ambulatory referral to Pain Clinic    Referral Priority:  Routine    Referral Type:  Consultation    Referral Reason:  Specialty Services Required    Requested Specialty:  Pain Medicine    Number of Visits Requested:  1   I personally performed the services described in this documentation, which was scribed in my presence. The recorded information has been reviewed and considered, and addended by me as needed.  Delman Cheadle, MD MPH   By signing my name below, I, Nadim Abuhashem, attest that this documentation has been prepared under the direction and in the presence of Delman Cheadle, MD.  Electronically Signed: Lora Havens, medical scribe. 07/23/2015, 5:41 PM.

## 2015-07-25 ENCOUNTER — Emergency Department (HOSPITAL_COMMUNITY)
Admission: EM | Admit: 2015-07-25 | Discharge: 2015-07-25 | Disposition: A | Discharge status: Home / Self Care | Payer: PRIVATE HEALTH INSURANCE | Attending: Emergency Medicine | Admitting: Emergency Medicine

## 2015-07-25 ENCOUNTER — Encounter (HOSPITAL_COMMUNITY): Payer: Self-pay | Admitting: Emergency Medicine

## 2015-07-25 DIAGNOSIS — G8929 Other chronic pain: Secondary | ICD-10-CM

## 2015-07-25 DIAGNOSIS — Z8742 Personal history of other diseases of the female genital tract: Secondary | ICD-10-CM | POA: Insufficient documentation

## 2015-07-25 DIAGNOSIS — F419 Anxiety disorder, unspecified: Secondary | ICD-10-CM

## 2015-07-25 DIAGNOSIS — R000 Tachycardia, unspecified: Secondary | ICD-10-CM | POA: Insufficient documentation

## 2015-07-25 DIAGNOSIS — Z760 Encounter for issue of repeat prescription: Secondary | ICD-10-CM | POA: Diagnosis present

## 2015-07-25 DIAGNOSIS — Z79899 Other long term (current) drug therapy: Secondary | ICD-10-CM | POA: Diagnosis not present

## 2015-07-25 DIAGNOSIS — I10 Essential (primary) hypertension: Secondary | ICD-10-CM | POA: Diagnosis not present

## 2015-07-25 NOTE — Discharge Instructions (Signed)
Chronic Pain Discharge Instructions  °Emergency care providers appreciate that many patients coming to us are in severe pain and we wish to address their pain in the safest, most responsible manner.  It is important to recognize however, that the proper treatment of chronic pain differs from that of the pain of injuries and acute illnesses.  Our goal is to provide quality, safe, personalized care and we thank you for giving us the opportunity to serve you. °The use of narcotics and related agents for chronic pain syndromes may lead to additional physical and psychological problems.  Nearly as many people die from prescription narcotics each year as die from car crashes.  Additionally, this risk is increased if such prescriptions are obtained from a variety of sources.  Therefore, only your primary care physician or a pain management specialist is able to safely treat such syndromes with narcotic medications long-term.   ° °Documentation revealing such prescriptions have been sought from multiple sources may prohibit us from providing a refill or different narcotic medication.  Your name may be checked first through the [facility] Controlled Substances Reporting System.  This database is a record of controlled substance medication prescriptions that the patient has received.  This has been established by [employer] in an effort to eliminate the dangerous, and often life threatening, practice of obtaining multiple prescriptions from different medical providers.  ° °If you have a chronic pain syndrome (i.e. chronic headaches, recurrent back or neck pain, dental pain, abdominal or pelvis pain without a specific diagnosis, or neuropathic pain such as fibromyalgia) or recurrent visits for the same condition without an acute diagnosis, you may be treated with non-narcotics and other non-addictive medicines.  Allergic reactions or negative side effects that may be reported by a patient to such medications will not  typically lead to the use of a narcotic analgesic or other controlled substance as an alternative. °  °Patients managing chronic pain with a personal physician should have provisions in place for breakthrough pain.  If you are in crisis, you should call your physician.  If your physician directs you to the emergency department, please have the doctor call and speak to our attending physician concerning your care. °  °When patients come to the Emergency Department (ED) with acute medical conditions in which the Emergency Department physician feels appropriate to prescribe narcotic or sedating pain medication, the physician will prescribe these in very limited quantities.  The amount of these medications will last only until you can see your primary care physician in his/her office.  Any patient who returns to the ED seeking refills should expect only non-narcotic pain medications.  ° °In the event of an acute medical condition exists and the emergency physician feels it is necessary that the patient be given a narcotic or sedating medication -  a responsible adult driver should be present in the room prior to the medication being given by the nurse. °  °Prescriptions for narcotic or sedating medications that have been lost, stolen or expired will not be refilled in the Emergency Department.   ° °Patients who have chronic pain may receive non-narcotic prescriptions until seen by their primary care physician.  It is every patient’s personal responsibility to maintain active prescriptions with his or her primary care physician or specialist. °

## 2015-07-25 NOTE — ED Notes (Signed)
Pt reports she is here for tramadol refill.  States "I just need 15, enough to wean myself off.  Coz If not my next step is to get heroin.  I have money and I have a good job."  Pt appears very anxious and shaky.  She is upset because she states that when she was here recently, the doctor who saw her talk to her like "I was a piece of shit."

## 2015-07-25 NOTE — ED Notes (Addendum)
Pt states she has been on tramadol for the past 2 years. Pt states she has been going to various providers for the past several days trying to get a prescription for tramadol. Pt has been trying to wean herself off tramadol and is afraid she is going to have a seizure due to withdrawel. Pt states if she cannot get ahold of tramadol she plans to buy heroin. Denies SI/HI. Has been told she needs to go to a pain management provider. Pt anxious and crying in triage

## 2015-07-25 NOTE — ED Provider Notes (Signed)
CSN: [id number]     Arrival date & time 07/25/15  1548 History   First MD Initiated Contact with Patient 07/25/15 1632     Chief Complaint  Patient presents with  . Medication Refill      HPI Patient presents to the emergency department requesting a refill of her tramadol.  She states she has taken 180 doses of tramadol every month for the past 5 years.  She recently relocated to this area and is currently without her tramadol.  She states she takes this for chronic diffuse pain all over.  She reports now that her last tramadol dose was 2 days ago.  She is requesting 10-15 tramadol pills so that she can "detox herself".  She reports that if she is unable to get the tramadol here then she will likely attempt to buy heroin off the street.  On my arrival to the patient's bedside her heart rate is 106.  When she begins talking about the pain medication and discussing the issues her heart rate rises 140.  She denies nausea or vomiting.  No other complaints.   Past Medical History  Diagnosis Date  . Dysmenorrhea   . Hypertension     no meds currently   Past Surgical History  Procedure Laterality Date  . Gallbladder surgery  2011  . Hand surgery  20 years ago    surgery due to infection per pt.  . Dilatation & currettage/hysteroscopy with resectocope N/A 05/08/2015    Procedure: DILATATION & CURETTAGE/HYSTEROSCOPY WITH RESECTOCOPE;  Surgeon: Sanjuana Kava, MD;  Location: [facility];  Service: Gynecology;  Laterality: N/A;  . Novasure ablation N/A 05/08/2015    Procedure: Rebbeca Paul ABLATION;  Surgeon: Sanjuana Kava, MD;  Location: [facility];  Service: Gynecology;  Laterality: N/A;   Family History  Problem Relation Age of Onset  . Adopted: Yes   Social History  Substance Use Topics  . Smoking status: Never Smoker   . Smokeless tobacco: None  . Alcohol Use: No     Comment: LAST TIME-   2 MTHS AGO   OB History    Gravida Para Term Preterm AB TAB SAB Ectopic Multiple Living   2    2  2          Review of Systems  All other systems reviewed and are negative.     Allergies  Ciprofloxacin  Home Medications   Prior to Admission medications   Medication Sig Start Date End Date Taking? Authorizing Provider  cloNIDine (CATAPRES) 0.3 MG tablet Take 1 tablet (0.3 mg total) by mouth 3 (three) times daily. 11/11/14  Yes Robyn Haber, MD  traMADol (ULTRAM) 50 MG tablet Take 2 tablets (100 mg total) by mouth every 6 (six) hours as needed. Patient taking differently: Take 100 mg by mouth every 6 (six) hours as needed for moderate pain.  05/08/15  Yes Sanjuana Kava, MD  HYDROcodone-acetaminophen (NORCO/VICODIN) 5-325 MG per tablet Take 2 tablets by mouth every 4 (four) hours as needed. Patient not taking: Reported on 07/25/2015 06/16/15   Melony Overly, MD  meloxicam (MOBIC) 15 MG tablet Take 1 tablet (15 mg total) by mouth daily. Patient not taking: Reported on 07/23/2015 06/16/15   Melony Overly, MD  naproxen (NAPROSYN) 500 MG tablet Take 1 tablet (500 mg total) by mouth 2 (two) times daily with a meal. Patient not taking: Reported on 07/23/2015 05/08/15   Sanjuana Kava, MD   BP 151/98 mmHg  Pulse 136  Temp(Src) 99 F (37.2 C) (  Oral)  Resp 16  SpO2 100%  LMP 06/25/2015 Physical Exam  Constitutional: She is oriented to person, place, and time. She appears well-developed and well-nourished.  HENT:  Head: Normocephalic.  Eyes: EOM are normal.  Neck: Normal range of motion.  Cardiovascular:  Tachycardic rate  Pulmonary/Chest: Effort normal.  Abdominal: She exhibits no distension.  Musculoskeletal: Normal range of motion.  Neurological: She is alert and oriented to person, place, and time.  Psychiatric: She has a normal mood and affect.  Nursing note and vitals reviewed.   ED Course  Procedures (including critical care time) Labs Review Labs Reviewed - No data to display  Imaging Review No results found. I have personally reviewed and evaluated these images and lab results as part  of my medical decision-making.   EKG Interpretation   Date/Time:  Friday July 25 2015 16:36:32 EDT Ventricular Rate:  132 PR Interval:  117 QRS Duration: 80 QT Interval:  309 QTC Calculation: 458 R Axis:   35 Text Interpretation:  Sinus tachycardia Multiform ventricular premature  complexes Minimal ST depression, diffuse leads No significant change was  found except for tachycardia Confirmed by Daymeon Fischman  MD, Lennette Bihari ([id number]) on  07/25/2015 4:42:02 PM      MDM   Final diagnoses:  Chronic pain  Anxiety    Medical screening examination completed.  I think much of her elevated heart rate is secondary to anxiety.  Again she has a resting sinus tachycardia of 106 when she is lying in the room.  No indication for additional workup.  Patient only requests a refill of chronic pain medication.  ED chronic pain policy information given to the patient.  She is upset about the fact that I will not write her a short course of controlled substance    Jola Schmidt, MD 07/25/15 [phone]

## 2015-07-29 ENCOUNTER — Ambulatory Visit: Payer: PRIVATE HEALTH INSURANCE | Admitting: Internal Medicine

## 2015-07-29 DIAGNOSIS — Z0289 Encounter for other administrative examinations: Secondary | ICD-10-CM

## 2015-08-04 ENCOUNTER — Other Ambulatory Visit: Payer: Self-pay | Admitting: Otolaryngology

## 2015-11-21 ENCOUNTER — Encounter (HOSPITAL_COMMUNITY): Payer: Self-pay

## 2015-11-21 ENCOUNTER — Emergency Department (HOSPITAL_COMMUNITY)
Admission: EM | Admit: 2015-11-21 | Discharge: 2015-11-21 | Disposition: A | Discharge status: Home / Self Care | Payer: PRIVATE HEALTH INSURANCE | Attending: Emergency Medicine | Admitting: Emergency Medicine

## 2015-11-21 DIAGNOSIS — Z8742 Personal history of other diseases of the female genital tract: Secondary | ICD-10-CM | POA: Insufficient documentation

## 2015-11-21 DIAGNOSIS — Z760 Encounter for issue of repeat prescription: Secondary | ICD-10-CM | POA: Diagnosis not present

## 2015-11-21 DIAGNOSIS — I10 Essential (primary) hypertension: Secondary | ICD-10-CM | POA: Insufficient documentation

## 2015-11-21 MED ORDER — CLONIDINE HCL 0.3 MG PO TABS: 0.3000 mg | ORAL_TABLET | Freq: Three times a day (TID) | ORAL | Status: AC

## 2015-11-21 NOTE — Progress Notes (Signed)
ED CM in TCU when Pt came out of her room to inform fast track NP/PA that she may not have money to get her medication and wanting NP to give her a bp pill before leaving the ED  Cm spoke with NP/PA and found out pt stated she works, did not have time to get to a pcp or to pick up her medication  Pt not listed with a pcp Cm spoke with pt to inform her that Cm was at the nursing station when she came out to speak with the NP/PA and Cm wanted to offer her resources to assist with pcp, pharmacies, financial assistance and medications Cm provided pt with a list of [employer] in network providers in her zip code [phone] Encouraged pt to find a pcp for follow up care and community management of her BP on an ongoing basis CM provided pt a cost sheet from [employer] that indicates without insurance cost for catapres at [facility] will be $10.45 Cm discussed with pt that with her [employer] coverage it would probably be cheaper Cm provided pt with a list of pharmacies that deliver and a list of pharmacies that is open for 24 hours in [place] Pt voiced understanding  Prior to leaving Cm heard pt in a loud tone addressing the discharging RN about why ED PA not able to offer her a BP pill prior to d/c Pt sbp 134 in ED and she had been instructed to go to get her medication from the pharmacy today.  Cm discussed with the pt that NP/PA was providing general standard care

## 2015-11-21 NOTE — ED Notes (Signed)
Pt out of bp meds x 2 days.  Pt is trying to get back with her primary md.  Wanting script.  No other problems.

## 2015-11-21 NOTE — Discharge Instructions (Signed)
1. Medications: Take catapres as directed.  2. Treatment: rest, drink plenty of fluids 3. Follow Up: Please follow up with a primary doctor before you run out of this month's prescription for discussion of your diagnoses and further evaluation after today's visit; Please return to the ER for any new or worsening symptoms, any additional concerns.   Medicine Refill at the Emergency Department We have refilled your medicine today, but it is best for you to get refills through your primary health care provider's office. In the future, please plan ahead so you do not need to get refills from the emergency department. If the medicine we refilled was a maintenance medicine, you may have received only enough to get you by until you are able to see your regular health care provider.   This information is not intended to replace advice given to you by your health care provider. Make sure you discuss any questions you have with your health care provider.   Document Released: 01/28/2004 Document Revised: 11/01/2014 Document Reviewed: 01/18/2014 Elsevier Interactive Patient Education [employer].

## 2015-11-21 NOTE — ED Provider Notes (Signed)
CSN: [id number]     Arrival date & time 11/21/15  1135 History  By signing my name below, I, Irene Pap, attest that this documentation has been prepared under the direction and in the presence of [employer], PA-C. Electronically Signed: Irene Pap, ED Scribe. 11/21/2015. 12:10 PM.   Chief Complaint  Patient presents with  . Medication Refill   The history is provided by the patient. No language interpreter was used.  HPI Comments: Regina Estes is a 50 y.o. female with a hx of HTN who presents to the Emergency Department requesting a medication refill. Pt states that she has been out of her clonidine medication for 2 days. She reports taking 0.3 mg 3x a day - chart confirms this dosing. She is currently trying to get in contact with a PCP - she has insurance, but just needs to find a good time with her work schedule to make an appointment. She denies problems with her medication, fever, chills, vision changes, chest pain, SOB, nausea, vomiting, headaches, or dizziness. No complaints at this time.  Past Medical History  Diagnosis Date  . Dysmenorrhea   . Hypertension     no meds currently   Past Surgical History  Procedure Laterality Date  . Gallbladder surgery  2011  . Hand surgery  20 years ago    surgery due to infection per pt.  . Dilatation & currettage/hysteroscopy with resectocope N/A 05/08/2015    Procedure: DILATATION & CURETTAGE/HYSTEROSCOPY WITH RESECTOCOPE;  Surgeon: Sanjuana Kava, MD;  Location: [facility];  Service: Gynecology;  Laterality: N/A;  . Novasure ablation N/A 05/08/2015    Procedure: Rebbeca Paul ABLATION;  Surgeon: Sanjuana Kava, MD;  Location: [facility];  Service: Gynecology;  Laterality: N/A;   Family History  Problem Relation Age of Onset  . Adopted: Yes   Social History  Substance Use Topics  . Smoking status: Never Smoker   . Smokeless tobacco: None  . Alcohol Use: No     Comment: LAST TIME-   2 MTHS AGO   OB History    Gravida Para Term Preterm AB  TAB SAB Ectopic Multiple Living   2    2  2         Review of Systems  Constitutional: Negative for fever and chills.  Eyes: Negative for visual disturbance.  Respiratory: Negative for shortness of breath.   Cardiovascular: Negative for chest pain.  Gastrointestinal: Negative for nausea and vomiting.  Neurological: Negative for light-headedness and headaches.   Allergies  Ciprofloxacin  Home Medications   Prior to Admission medications   Medication Sig Start Date End Date Taking? Authorizing Provider  cloNIDine (CATAPRES) 0.3 MG tablet Take 1 tablet (0.3 mg total) by mouth 3 (three) times daily. 11/21/15   Ozella Almond Roberts Bon, PA-C  HYDROcodone-acetaminophen (NORCO/VICODIN) 5-325 MG per tablet Take 2 tablets by mouth every 4 (four) hours as needed. Patient not taking: Reported on 07/25/2015 06/16/15   Melony Overly, MD  meloxicam (MOBIC) 15 MG tablet Take 1 tablet (15 mg total) by mouth daily. Patient not taking: Reported on 07/23/2015 06/16/15   Melony Overly, MD  naproxen (NAPROSYN) 500 MG tablet Take 1 tablet (500 mg total) by mouth 2 (two) times daily with a meal. Patient not taking: Reported on 07/23/2015 05/08/15   Sanjuana Kava, MD  traMADol (ULTRAM) 50 MG tablet Take 2 tablets (100 mg total) by mouth every 6 (six) hours as needed. Patient not taking: Reported on 11/21/2015 05/08/15   Sanjuana Kava, MD  BP 134/103 mmHg  Pulse 96  Temp(Src) 98.6 F (37 C) (Oral)  Resp 14  SpO2 100% Physical Exam  Constitutional: She is oriented to person, place, and time. She appears well-developed and well-nourished.  NAD  HENT:  Head: Normocephalic and atraumatic.  Mouth/Throat: Oropharynx is clear and moist.  Neck: Normal range of motion. Neck supple.  Cardiovascular: Normal rate, regular rhythm, normal heart sounds and intact distal pulses.  Exam reveals no gallop and no friction rub.   No murmur heard. Pulmonary/Chest: Effort normal and breath sounds normal. No respiratory distress. She has no  wheezes. She has no rales.  Abdominal: Soft. She exhibits no distension. There is no tenderness.  Musculoskeletal: Normal range of motion. She exhibits no edema.  Neurological: She is alert and oriented to person, place, and time.  Skin: Skin is warm and dry. No rash noted. She is not diaphoretic.  Nursing note and vitals reviewed.   ED Course  Procedures (including critical care time) DIAGNOSTIC STUDIES: Oxygen Saturation is 100% on RA, normal by my interpretation.    COORDINATION OF CARE: 12:09 PM-Discussed treatment plan which includes prescription for clonidine with pt at bedside and pt agreed to plan.   Labs Review Labs Reviewed - No data to display  Imaging Review No results found. I have personally reviewed and evaluated these images and lab results as part of my medical decision-making.   EKG Interpretation None      MDM   Final diagnoses:  Medication refill   Patient here for refill of blood pressure medication. Will refill medication here. Discussed need to follow up with PCP. Social work in ED and gave patient resources for finding a PCP and where to find her medication for best price. Pt is safe for discharge with no complaints at this time.  I personally performed the services described in this documentation, which was scribed in my presence. The recorded information has been reviewed and is accurate.  [facility], PA-C 11/21/15 [place], MD 11/21/15 1550

## 2015-12-31 IMAGING — CR DG ABDOMEN ACUTE W/ 1V CHEST
3 series · 3 of 3 positions shown · non-contrast
Comparison: Prior CT from 04/10/2014

CLINICAL DATA: diffuse ab pain, n/v

EXAM:
ACUTE ABDOMEN SERIES (ABDOMEN 2 VIEW & CHEST 1 VIEW)

[w abdomen decub]
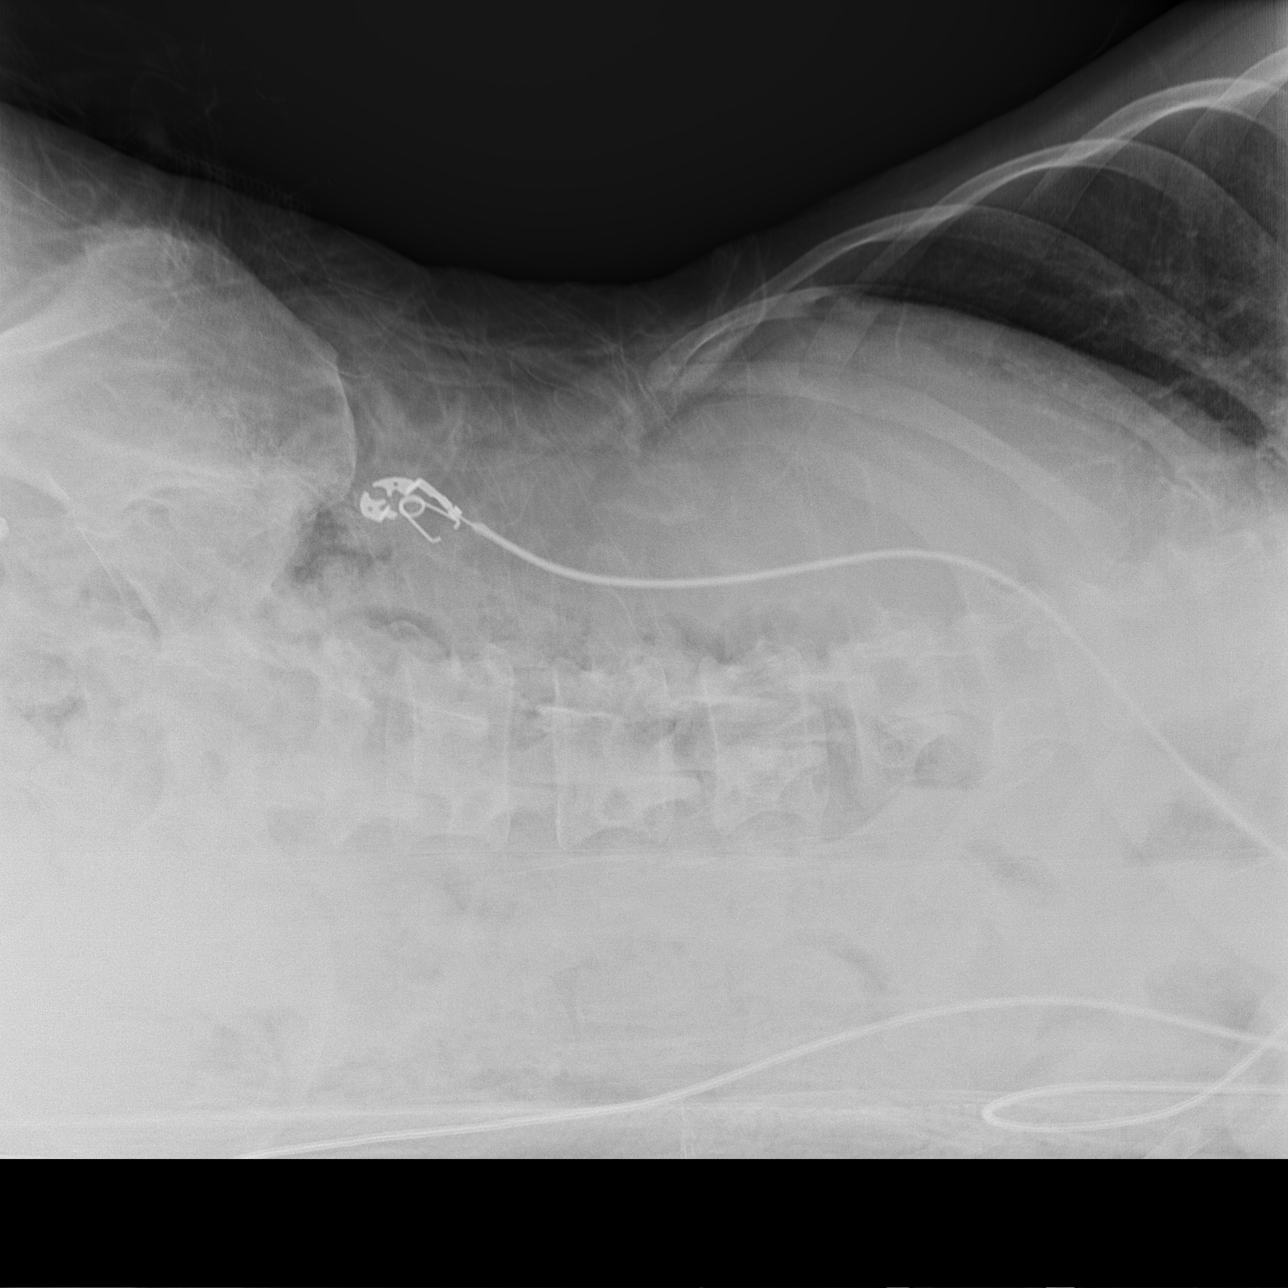

[x chest ap]
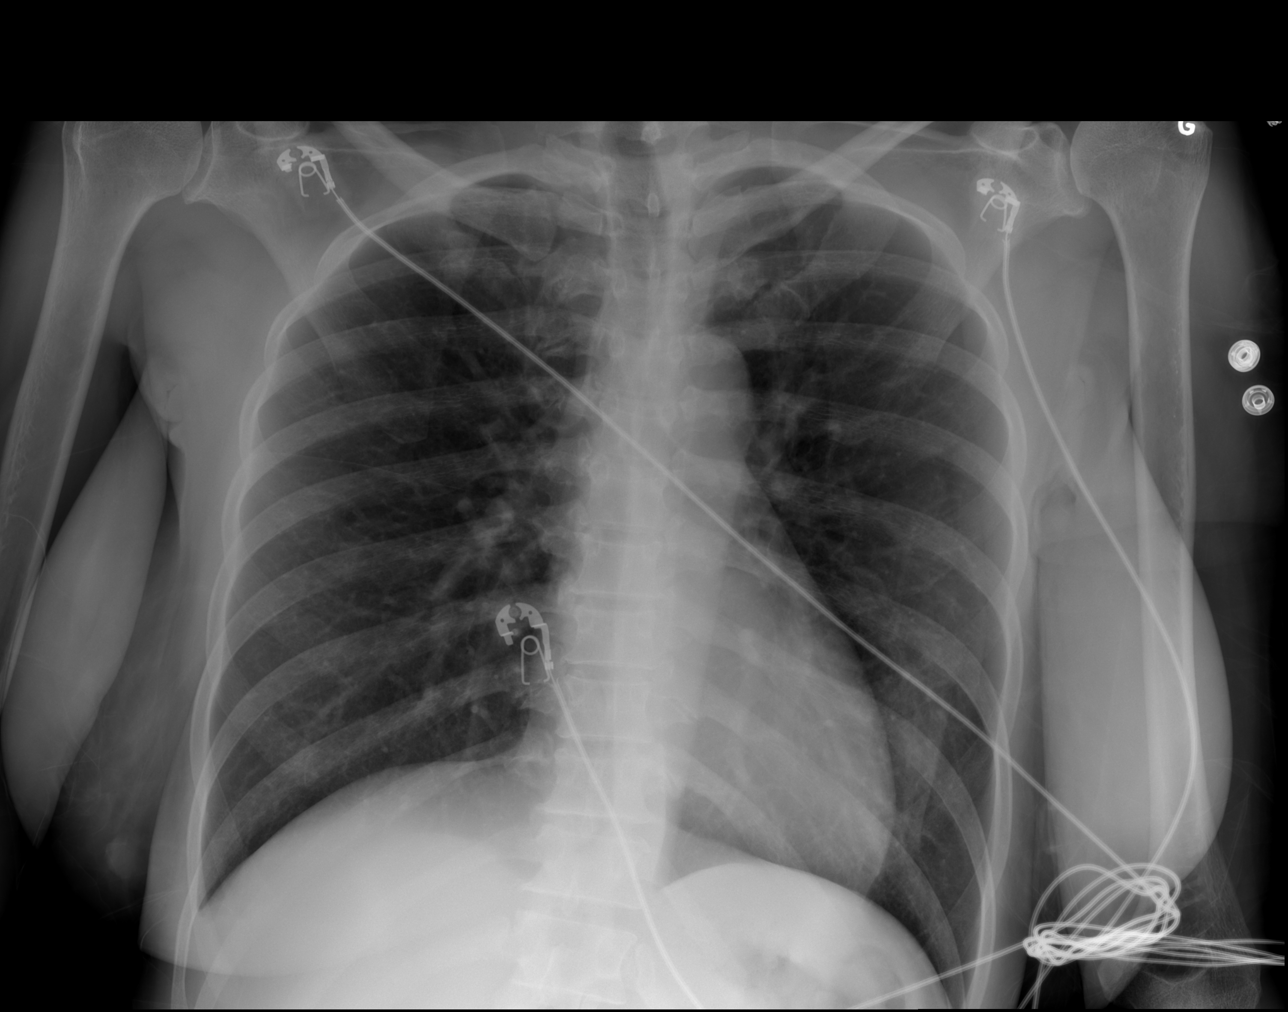

[x abdomen supine]
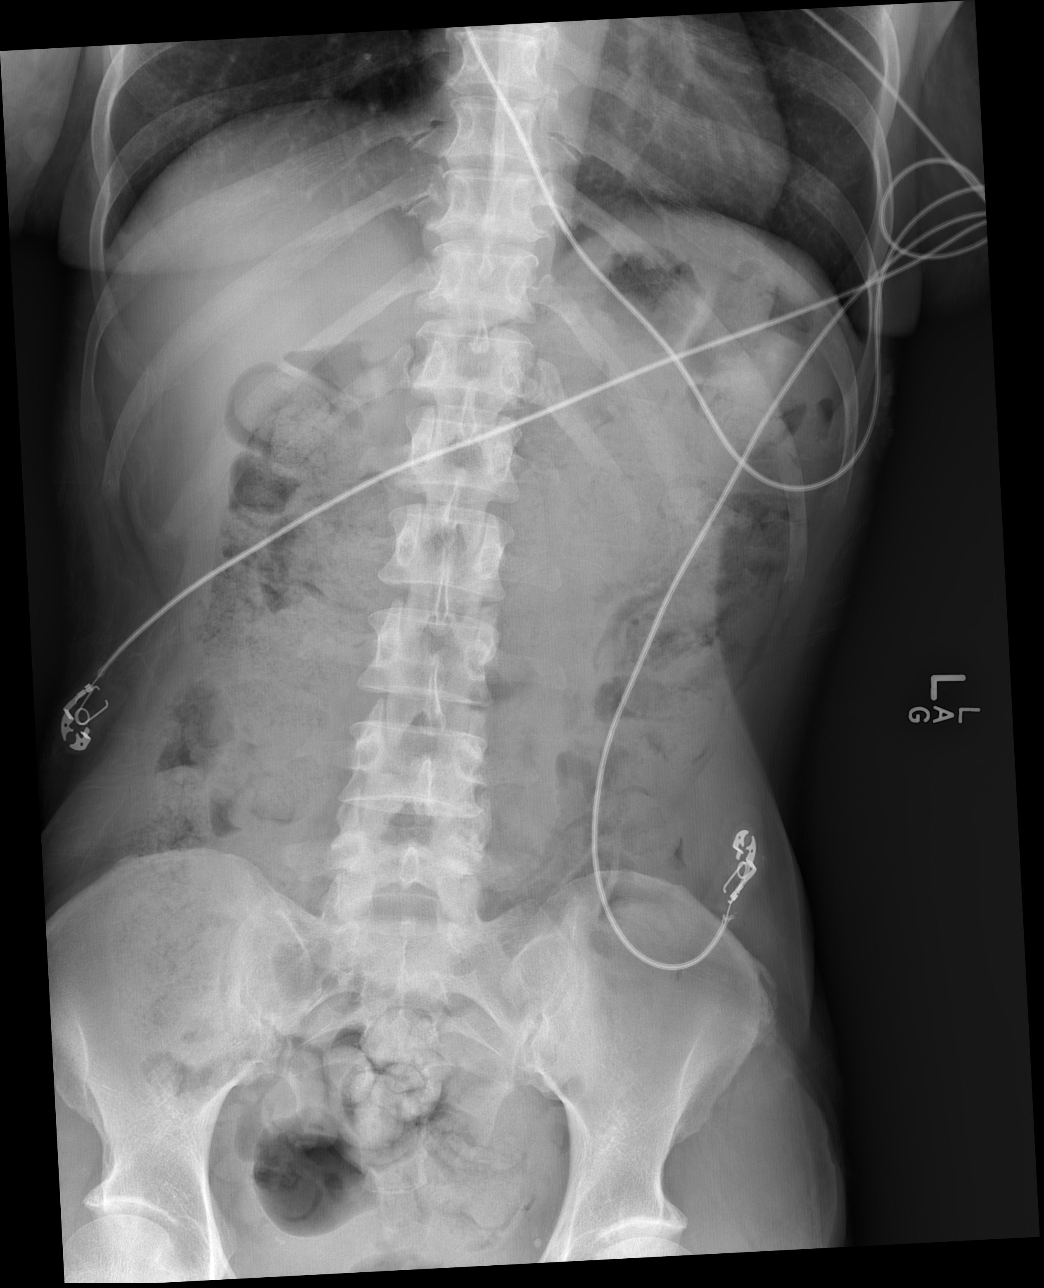

[3 of 3 positions shown; findings below may reference images not displayed]

FINDINGS: The cardiac and mediastinal silhouettes are stable in size and
contour, and remain within normal limits.

The lungs are normally inflated. No airspace consolidation, pleural
effusion, or pulmonary edema is identified. There is no
pneumothorax. Linear opacity overlying the left lung base is most
compatible with a skin fold.

Visualized bowel gas pattern is nonobstructive. Large amount of
retained stool seen diffusely throughout the colon, suggesting
constipation. No abnormal bowel wall thickening. No free
intraperitoneal air. No soft tissue mass or abnormal calcification.

No acute osseous abnormality.
IMPRESSION: 1. Nonobstructive bowel gas pattern.
2. Large amount of retained stool within the colon, suggesting
constipation.
3. No acute cardiopulmonary abnormality.

## 2015-12-31 IMAGING — CT CT ABD-PELV W/ CM
1 of 3 series · 14 of 32 positions shown, 19 images · IV contrast (100 ML OMNI 300)
Comparison: CT abdomen and pelvis 04/10/2014 and 11/20/2013. Chest
and two views abdomen earlier this same day. Pelvic ultrasound
12/30/2013.

CLINICAL DATA: Abdominal pain, nausea and vomiting for several
hours.

EXAM:
CT ABDOMEN AND PELVIS WITH CONTRAST
TECHNIQUE: Multidetector CT imaging of the abdomen and pelvis was performed
using the standard protocol following bolus administration of
intravenous contrast.
CONTRAST:  100 cc Omnipaque 300.

[Series 2: abd/pel with · axial · 0.62mm/px · z∈[+1050,+1445]mm · 14 of 89 slices shown, 19 images]
[im 5/89  soft-tissue]
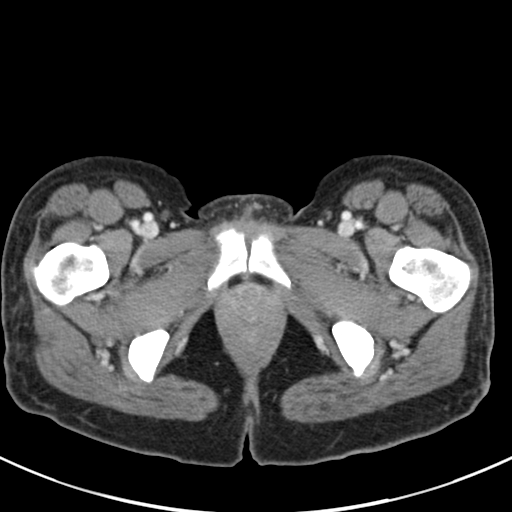
[im 5/89  bone]
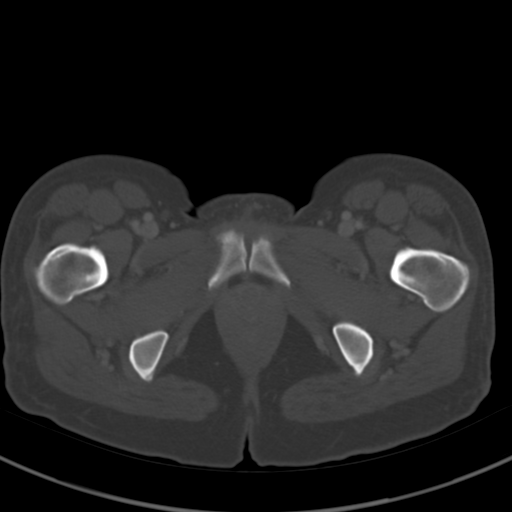
[im 14/89  soft-tissue]
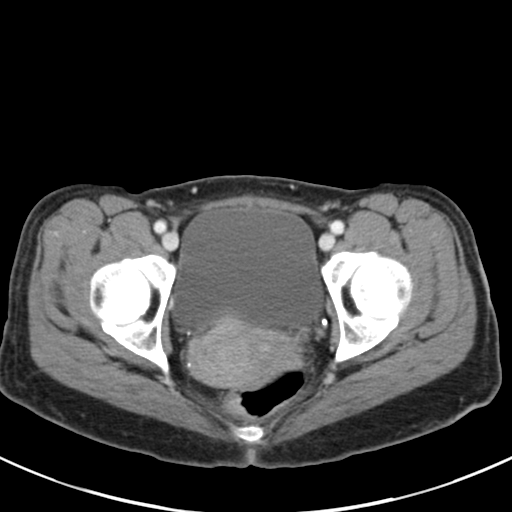
[im 18/89  soft-tissue]
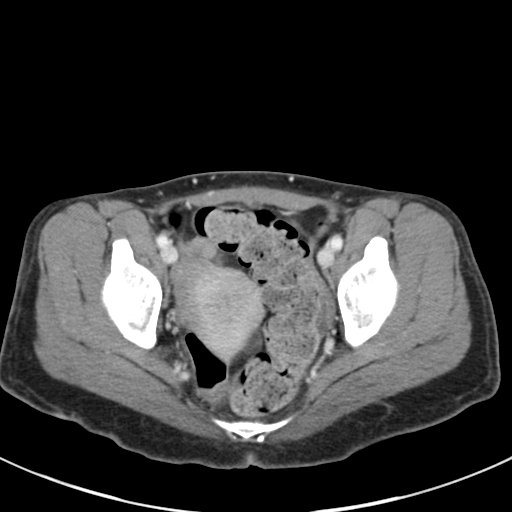
[im 27/89  soft-tissue]
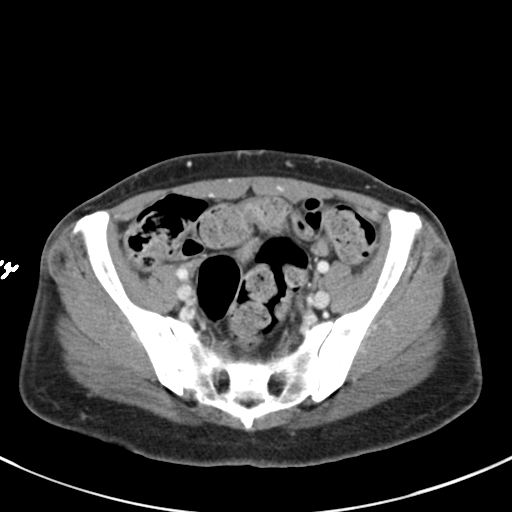
[im 31/89  soft-tissue]
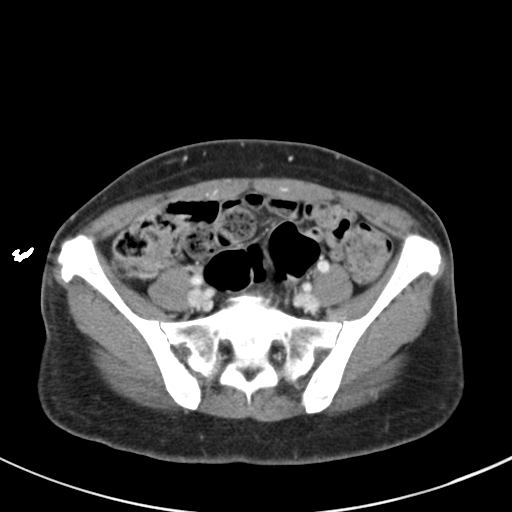
[im 40/89  soft-tissue]
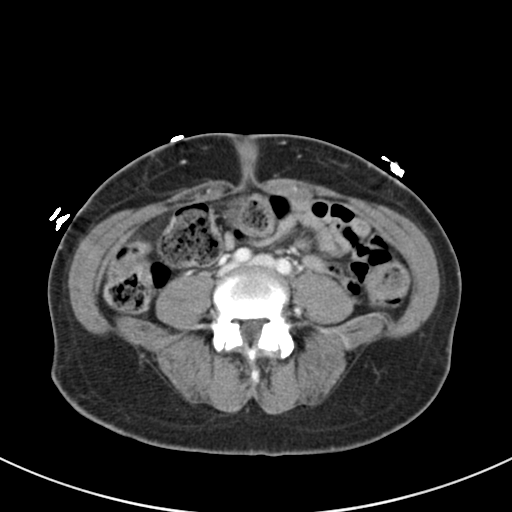
[im 45/89  soft-tissue]
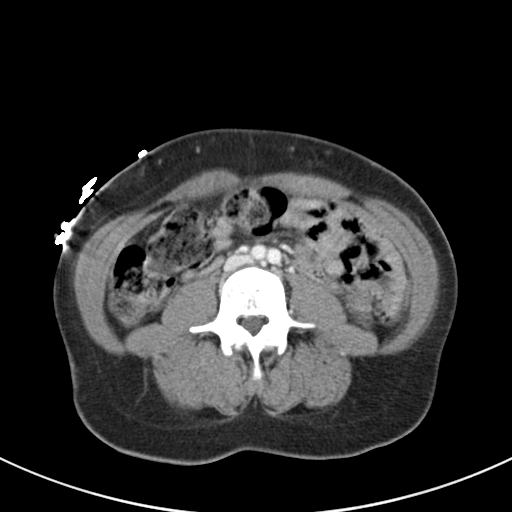
[im 49/89  soft-tissue]
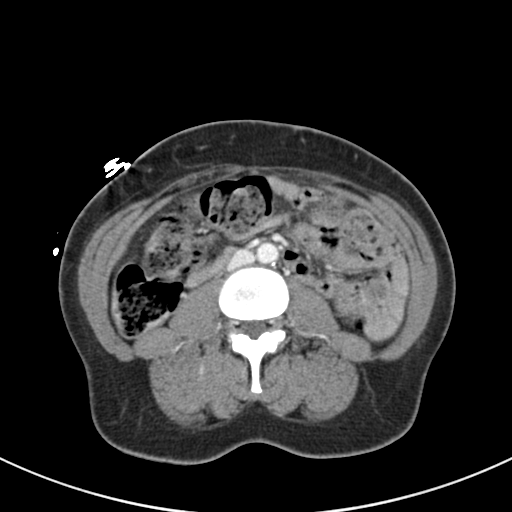
[im 58/89  soft-tissue]
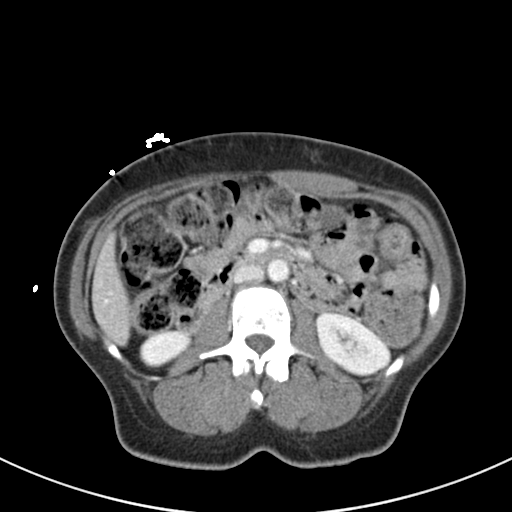
[im 58/89  bone]
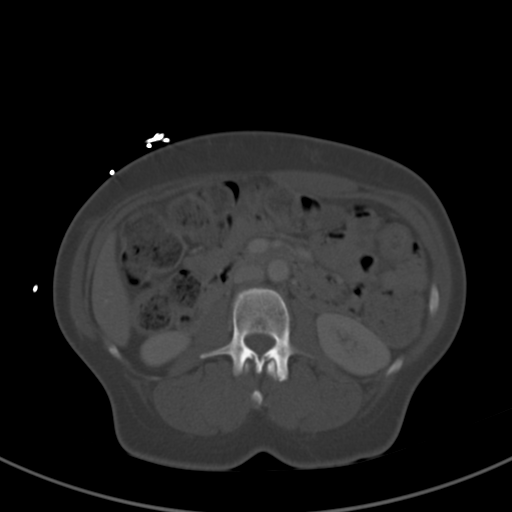
[im 62/89  soft-tissue]
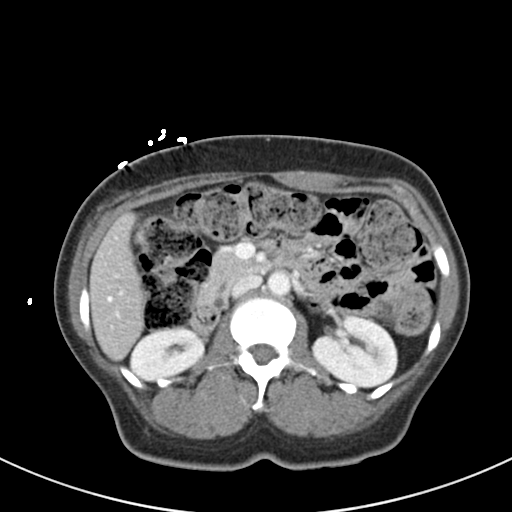
[im 71/89  soft-tissue]
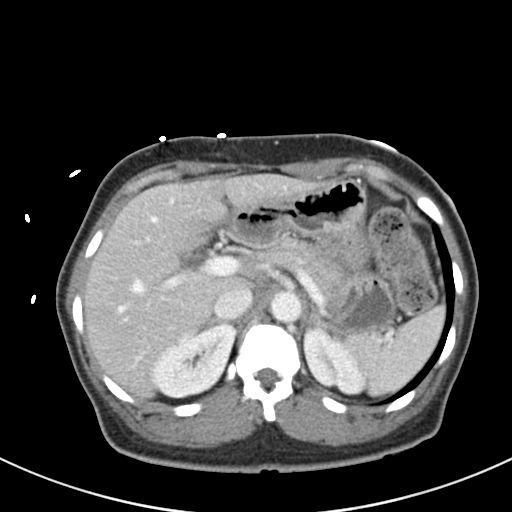
[im 71/89  lung]
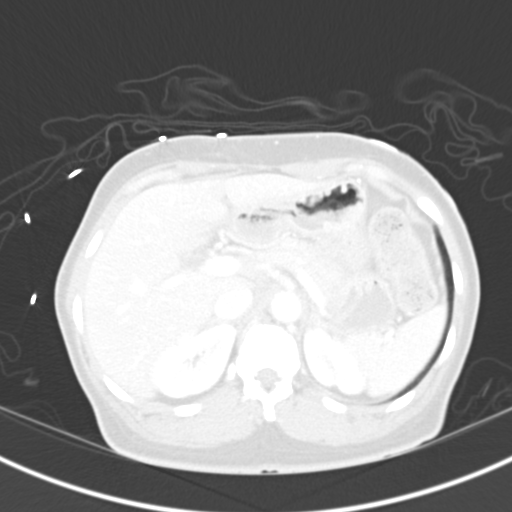
[im 75/89  soft-tissue]
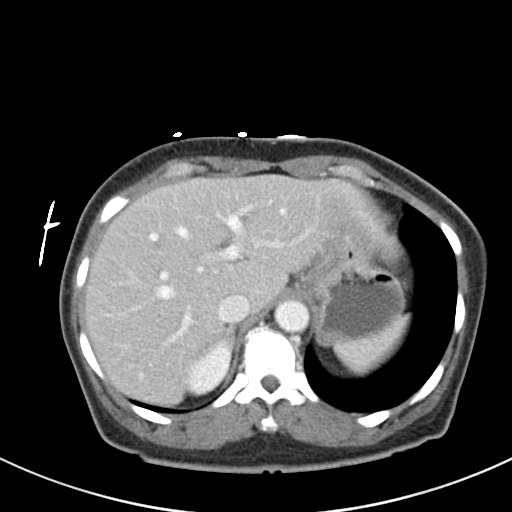
[im 75/89  lung]
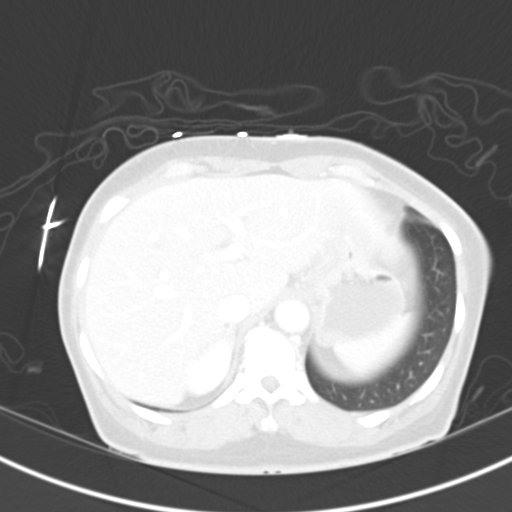
[im 80/89  lung]
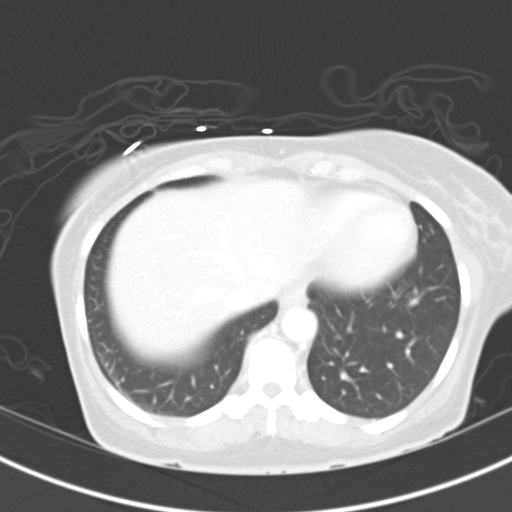
[im 84/89  soft-tissue]
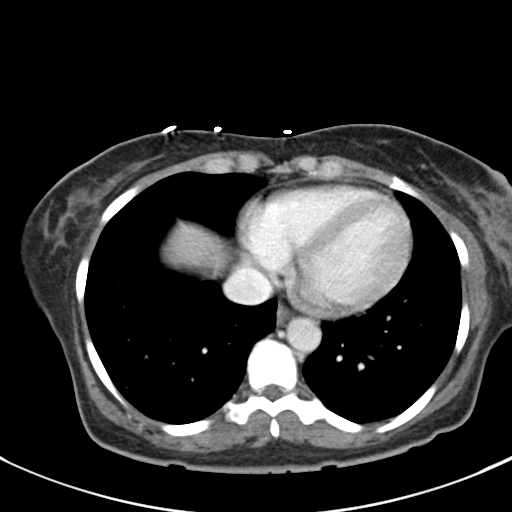
[im 84/89  lung]
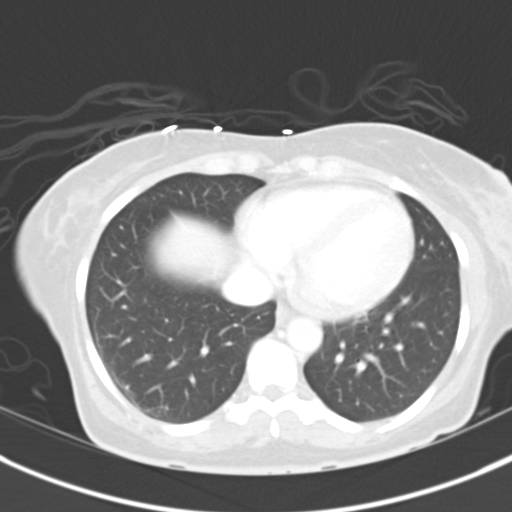

[14 of 32 positions shown; findings below may reference images not displayed]

FINDINGS: The lung bases are clear.  No pleural or pericardial effusion.

The patient is status post cholecystectomy. The liver, spleen,
adrenal glands, pancreas, right kidney and biliary tree all appear
normal. 0.5 cm nonobstructing stone is again seen in the midpole of
the left kidney. There is no evidence of bowel obstruction. The
patient has a very large volume of stool throughout the colon. The
stomach, small bowel and appendix appear normal. Possible
endometrial polyp described on report of prior exams is not as well
seen today. Adnexae and urinary bladder appear normal. There is no
lymphadenopathy or fluid. No focal bony abnormality is identified.
IMPRESSION: No acute finding.

Very large volume of stool throughout the colon.

0.5 cm nonobstructing stone midpole left kidney.

Possible endometrial polyp is noted as seen on prior studies.

## 2016-04-02 ENCOUNTER — Encounter (HOSPITAL_COMMUNITY): Payer: Self-pay | Admitting: Emergency Medicine

## 2016-04-02 ENCOUNTER — Emergency Department (HOSPITAL_COMMUNITY)
Admission: EM | Admit: 2016-04-02 | Discharge: 2016-04-02 | Disposition: A | Discharge status: Home / Self Care | Payer: PRIVATE HEALTH INSURANCE | Attending: Emergency Medicine | Admitting: Emergency Medicine

## 2016-04-02 DIAGNOSIS — Z79899 Other long term (current) drug therapy: Secondary | ICD-10-CM | POA: Diagnosis not present

## 2016-04-02 DIAGNOSIS — B9689 Other specified bacterial agents as the cause of diseases classified elsewhere: Secondary | ICD-10-CM | POA: Insufficient documentation

## 2016-04-02 DIAGNOSIS — A599 Trichomoniasis, unspecified: Secondary | ICD-10-CM | POA: Insufficient documentation

## 2016-04-02 DIAGNOSIS — N760 Acute vaginitis: Secondary | ICD-10-CM | POA: Insufficient documentation

## 2016-04-02 DIAGNOSIS — N898 Other specified noninflammatory disorders of vagina: Secondary | ICD-10-CM | POA: Diagnosis present

## 2016-04-02 DIAGNOSIS — I10 Essential (primary) hypertension: Secondary | ICD-10-CM | POA: Insufficient documentation

## 2016-04-02 LAB — COMPREHENSIVE METABOLIC PANEL
ALBUMIN: 3.4 g/dL — AB (ref 3.5–5.0)
ALK PHOS: 96 U/L (ref 38–126)
ALT: 31 U/L (ref 14–54)
ANION GAP: 6 (ref 5–15)
AST: 38 U/L (ref 15–41)
BILIRUBIN TOTAL: 0.4 mg/dL (ref 0.3–1.2)
BUN: 7 mg/dL (ref 6–20)
CALCIUM: 9.6 mg/dL (ref 8.9–10.3)
CO2: 26 mmol/L (ref 22–32)
CREATININE: 0.84 mg/dL (ref 0.44–1.00)
Chloride: 105 mmol/L (ref 101–111)
GFR calc Af Amer: >60 mL/min (ref >60)
GFR calc non Af Amer: >60 mL/min (ref >60)
GLUCOSE: 105 mg/dL — AB (ref 65–99)
Potassium: 4.2 mmol/L (ref 3.5–5.1)
Sodium: 137 mmol/L (ref 135–145)
TOTAL PROTEIN: 7.5 g/dL (ref 6.5–8.1)

## 2016-04-02 LAB — URINALYSIS, ROUTINE W REFLEX MICROSCOPIC
Bilirubin Urine: NEGATIVE
GLUCOSE, UA: NEGATIVE mg/dL
HGB URINE DIPSTICK: NEGATIVE
KETONES UR: NEGATIVE mg/dL
Nitrite: NEGATIVE
PH: 6.5 (ref 5.0–8.0)
Protein, ur: NEGATIVE mg/dL
SPECIFIC GRAVITY, URINE: 1.014 (ref 1.005–1.030)

## 2016-04-02 LAB — CBC WITH DIFFERENTIAL/PLATELET
BASOS PCT: 0 %
Basophils Absolute: 0 10*3/uL (ref 0.0–0.1)
Eosinophils Absolute: 0.2 10*3/uL (ref 0.0–0.7)
Eosinophils Relative: 4 %
HEMATOCRIT: 39.8 % (ref 36.0–46.0)
HEMOGLOBIN: 12.9 g/dL (ref 12.0–15.0)
LYMPHS ABS: 2 10*3/uL (ref 0.7–4.0)
Lymphocytes Relative: 43 %
MCH: 28.2 pg (ref 26.0–34.0)
MCHC: 32.4 g/dL (ref 30.0–36.0)
MCV: 86.9 fL (ref 78.0–100.0)
MONOS PCT: 8 %
Monocytes Absolute: 0.4 10*3/uL (ref 0.1–1.0)
NEUTROS ABS: 2.1 10*3/uL (ref 1.7–7.7)
NEUTROS PCT: 45 %
Platelets: 245 10*3/uL (ref 150–400)
RBC: 4.58 MIL/uL (ref 3.87–5.11)
RDW: 14.5 % (ref 11.5–15.5)
WBC: 4.7 10*3/uL (ref 4.0–10.5)

## 2016-04-02 LAB — WET PREP, GENITAL
SPERM: NONE SEEN
Yeast Wet Prep HPF POC: NONE SEEN

## 2016-04-02 LAB — URINE MICROSCOPIC-ADD ON: RBC / HPF: NONE SEEN RBC/hpf (ref 0–5)

## 2016-04-02 LAB — LIPASE, BLOOD: Lipase: 25 U/L (ref 11–51)

## 2016-04-02 LAB — I-STAT BETA HCG BLOOD, ED (MC, WL, AP ONLY): I-stat hCG, quantitative: <5 m[IU]/mL (ref <5)

## 2016-04-02 MED ORDER — METRONIDAZOLE 500 MG PO TABS
500.0000 mg | ORAL_TABLET | Freq: Once | ORAL | Status: AC
  Administered 2016-04-02: 500 mg via ORAL
  Filled 2016-04-02: qty 1

## 2016-04-02 MED ORDER — METRONIDAZOLE 500 MG PO TABS: 500.0000 mg | ORAL_TABLET | Freq: Two times a day (BID) | ORAL | Status: AC

## 2016-04-02 NOTE — ED Notes (Signed)
Pt ambulates independently and with steady gait at time of discharge. Discharge instructions and follow up information reviewed with patient. No other questions or concerns voiced at this time.  

## 2016-04-02 NOTE — Discharge Instructions (Signed)
You were not tested for all STDs today. Your gonorrhea and chlamydia tests are pending- if they are positive, you will receive a phone call. Refrain from sex until you have the results from a full STD screen.   Do not drink alcohol while you are taking flagyl (metronidazole) because it will make you very sick.  Do not hesitate to return to the emergency room for any new, worsening or concerning symptoms.  Please obtain primary care using resource guide below. Let them know that you were seen in the emergency room and that they will need to obtain records for further outpatient management.     Trichomoniasis Trichomoniasis is an infection caused by an organism called Trichomonas. The infection can affect both women and men. In women, the outer female genitalia and the vagina are affected. In men, the penis is mainly affected, but the prostate and other reproductive organs can also be involved. Trichomoniasis is a sexually transmitted infection (STI) and is most often passed to another person through sexual contact.  RISK FACTORS  Having unprotected sexual intercourse.  Having sexual intercourse with an infected partner. SIGNS AND SYMPTOMS  Symptoms of trichomoniasis in women include:  Abnormal gray-green frothy vaginal discharge.  Itching and irritation of the vagina.  Itching and irritation of the area outside the vagina. Symptoms of trichomoniasis in men include:   Penile discharge with or without pain.  Pain during urination. This results from inflammation of the urethra. DIAGNOSIS  Trichomoniasis may be found during a Pap test or physical exam. Your health care provider may use one of the following methods to help diagnose this infection:  Testing the pH of the vagina with a test tape.  Using a vaginal swab test that checks for the Trichomonas organism. A test is available that provides results within a few minutes.  Examining a urine sample.  Testing vaginal  secretions. Your health care provider may test you for other STIs, including HIV. TREATMENT   You may be given medicine to fight the infection. Women should inform their health care provider if they could be or are pregnant. Some medicines used to treat the infection should not be taken during pregnancy.  Your health care provider may recommend over-the-counter medicines or creams to decrease itching or irritation.  Your sexual partner will need to be treated if infected.  Your health care provider may test you for infection again 3 months after treatment. HOME CARE INSTRUCTIONS   Take medicines only as directed by your health care provider.  Take over-the-counter medicine for itching or irritation as directed by your health care provider.  Do not have sexual intercourse while you have the infection.  Women should not douche or wear tampons while they have the infection.  Discuss your infection with your partner. Your partner may have gotten the infection from you, or you may have gotten it from your partner.  Have your sex partner get examined and treated if necessary.  Practice safe, informed, and protected sex.  See your health care provider for other STI testing. SEEK MEDICAL CARE IF:   You still have symptoms after you finish your medicine.  You develop abdominal pain.  You have pain when you urinate.  You have bleeding after sexual intercourse.  You develop a rash.  Your medicine makes you sick or makes you throw up (vomit). MAKE SURE YOU:  Understand these instructions.  Will watch your condition.  Will get help right away if you are not doing well or get  worse.   This information is not intended to replace advice given to you by your health care provider. Make sure you discuss any questions you have with your health care provider.   Document Released: 04/06/2001 Document Revised: 11/01/2014 Document Reviewed: 07/23/2013 Elsevier Interactive Patient  Education 2016 [employer]  [employer] Resource Guide Financial Assistance The United Ways 211 is a great source of information about community services available.  Access by dialing 2-1-1 from anywhere in [place], or by website -  PooledIncome.plwww.nc211.org.   Other Local Resources (Updated 10/2015)  Financial Assistance   Services    Phone Number and Address  [facility]  Low-cost medical care - 1st and 3rd Saturday of every month  Must not qualify for public or private insurance and must have limited income [phone] [address] [place], [place]    [place] Department of Social Services  Child care  Emergency assistance for housing and [employer]ly[employer]ark[employer]ilities  Food stamps  Medicaid [phone] [address] [place], [place] [address]   [facility]  Low-cost medical care for children, communicable diseases, sexually-transmitted diseases, immunizations, maternity care, womens health and family planning [phone] [address] [place], [place] [address]  [facility]   Medication assistance for [facility] [place] residents  Must meet income requirements [phone] [address] [place], [place].    [facility] Social Services  Child care  Emergency assistance for housing and [employer]ark[employer]ilities  Food stamps  Medicaid [phone] [address] [place], [place] [address]  [facility]   Low-cost medical care,   Monday through Friday, 9 am to 6 pm.   Accepts Medicare/Medicaid, and self-pay [phone] [address] [place], [place] [address]  [facility]  Low-cost medical care - Monday through Friday, 8:30 am - 5:30 pm  Accepts Medicaid and self-pay [phone] [address] [place], [place] [address]   [facility]  Primary medical care, including for those with sickle  cell disease  Accepts Medicare, Medicaid, insurance and self-pay [phone] [address] [place], [place]  [facility]   Primary medical care  Accepts Medicare, [place], insurance and self-pay [phone] [address] [place], [place] [address]   [facility] Department of Social Services  Child care  Emergency assistance for housing and [employer]ark[employer]ilities  Food stamps  Medicaid [phone] [address] [place], [place] [address]  [facility] [place] Department of Health and [employer]Human Services  Child care  Emergency assistance for housing and [employer]ly-[employer]arkutilities  Food stamps  Medicaid [phone] [address] [place], [place] [address]   [facility]  Medication assistance for [facility] residents with no insurance only  Must have a primary care doctor [phone] [address], [address] [place], [place]  [facility]   Primary medical care  [place] Medicare, [place]icaid, insurance  [phone] [address] [place], [place]  MedAssist   Medication assistance [phone]  [facility]   Primary medical care  Accepts Medicare, [place], insurance and self-pay [phone] [address] [place], [place] [address]  [facility]   Primary medical care  Accepts Medicare, [place], insurance and self-pay [phone] [address] [place], [place] [address]  [facility]  For [place] residents between the ages of 2418 and 8764 who do not have any form of health insurance, Medicare, [place]icaid, or [place]  benefits.  Services are provided free of charge to uninsured patients who fall within federal poverty guidelines.    Hours: Tuesdays and Thursdays, 4:15 - 8 pm [phone] [address] [place], [place] [address]  [facility]     Primary medical care  Dental care  Nutritional  counseling  Pharmacy  Accepts Medicaid, Medicare, most insurance.  Fees are adjusted based on ability to pay.   [place], [place] [address] [place], [place], [place] [facility], [place], [facility] [place], [place]  [facility]  Womens health and family planning [phone] [place]. [place], [place] care  Emergency assistance for housing and [employer]  Medicaid [phone] [address], [place], [place] [address]   [facility]    Ages 4 and older  Hours: Mondays and Thursdays, 7:00 am - 9:00 am Patients are seen on a first come, first served basis. [phone], ext. [place], [place]  Child care  Emergency assistance for housing and [employer]  Medicaid [phone] [address], [place] [address]  [facility] [place]  Medication assistance  Rental assistance  Food pantry  Medication assistance  Housing assistance  Emergency food distribution  Utility assistance [place], [place]  [place]. [place], [place] [address] Hours: Tuesdays and Thursdays from 9am - 12 noon by appointment only  [place], [place] [address]  [facility] private insurance, [place], and [place].  Payment is based on a sliding scale for those without insurance.  Hours: Mondays, Tuesdays and Thursdays, 8:30 am - 5:30 pm.   [phone] [place], [place]   [facility] - Family Medicine at [facility], [place], and [place].  Payment is based on a sliding scale for those without insurance. [phone] [address] [place], [place]  [facility] - Pediatrics at E. Scientist, research (medical), [employer], and [place].  Payment is based on a sliding scale for those without insurance [phone] [address] [place], [employer], [place]  [facility] - Pediatrics at [employer], [place], and [place].  Payment is based on a sliding scale for those without insurance. [phone] [place], [place]  [facility] - Pediatrics at [facility], [place], and [place].  Payment is based on a sliding scale for those without insurance. [phone], ext. [id number] [place], [place].    [place] care.  Accepts Medicaid and self-pay. [place], [place]

## 2016-04-02 NOTE — ED Notes (Signed)
Pt ambulatory to restroom

## 2016-04-02 NOTE — ED Provider Notes (Signed)
CSN: [id number]     Arrival date & time 04/02/16  [id number] History   First MD Initiated Contact with Patient 04/02/16 1108     Chief Complaint  Patient presents with  . Abdominal Pain     (Consider location/radiation/quality/duration/timing/severity/associated sxs/prior Treatment) HPI   Blood pressure 148/103, pulse 78, resp. rate 18, SpO2 100 %.  Regina Estes is a 50 y.o. female complaining of bilateral lower abdominal pain with abnormal vaginal discharge onset 1 week ago. Patient states her boyfriend was treated for STD and likely check, chart review shows that the boyfriend had negative gonorrhea and chlamydia positive trichomonas by UA. Patient denies urinary frequency,Dysuria, hematuria, fever, chills, nausea, vomiting.    Past Medical History  Diagnosis Date  . Dysmenorrhea   . Hypertension     no meds currently   Past Surgical History  Procedure Laterality Date  . Gallbladder surgery  2011  . Hand surgery  20 years ago    surgery due to infection per pt.  . Dilatation & currettage/hysteroscopy with resectocope N/A 05/08/2015    Procedure: DILATATION & CURETTAGE/HYSTEROSCOPY WITH RESECTOCOPE;  Surgeon: Sanjuana Kava, MD;  Location: [facility];  Service: Gynecology;  Laterality: N/A;  . Novasure ablation N/A 05/08/2015    Procedure: Rebbeca Paul ABLATION;  Surgeon: Sanjuana Kava, MD;  Location: [facility];  Service: Gynecology;  Laterality: N/A;   Family History  Problem Relation Age of Onset  . Adopted: Yes   Social History  Substance Use Topics  . Smoking status: Never Smoker   . Smokeless tobacco: None  . Alcohol Use: No     Comment: LAST TIME-   2 MTHS AGO   OB History    Gravida Para Term Preterm AB TAB SAB Ectopic Multiple Living   2    2  2         Review of Systems  10 systems reviewed and found to be negative, except as noted in the HPI.   Allergies  Ciprofloxacin  Home Medications   Prior to Admission medications   Medication Sig Start Date End Date Taking?  Authorizing Provider  cloNIDine (CATAPRES) 0.3 MG tablet Take 1 tablet (0.3 mg total) by mouth 3 (three) times daily. 11/21/15  Yes Jaime Pilcher Ward, PA-C  metroNIDAZOLE (FLAGYL) 500 MG tablet Take 1 tablet (500 mg total) by mouth 2 (two) times daily. One tab PO bid x 10 days 04/02/16   Elmyra Ricks Katoria Yetman, PA-C   BP 158/120 mmHg  Pulse 79  Temp(Src) 98.2 F (36.8 C) (Oral)  Resp 15  SpO2 100% Physical Exam  Constitutional: She is oriented to person, place, and time. She appears well-developed and well-nourished. No distress.  HENT:  Head: Normocephalic and atraumatic.  Mouth/Throat: Oropharynx is clear and moist.  Eyes: Conjunctivae and EOM are normal. Pupils are equal, round, and reactive to light.  Neck: Normal range of motion.  Cardiovascular: Normal rate, regular rhythm and intact distal pulses.   Pulmonary/Chest: Effort normal and breath sounds normal. No stridor.  Abdominal: Soft. Bowel sounds are normal. She exhibits no distension and no mass. There is no tenderness. There is no rebound and no guarding.  Genitourinary:  Pelvic exam a chaperoned by nurse Erlene Quan: No rashes or lesions, patient has white, thick, non-foul-smelling vaginal discharge with no cervical motion or adnexal tenderness.  Musculoskeletal: Normal range of motion.  Neurological: She is alert and oriented to person, place, and time.  Skin: She is not diaphoretic.  Psychiatric: She has a normal mood and affect.  Nursing note and vitals reviewed.   ED Course  Procedures (including critical care time) Labs Review Labs Reviewed  WET PREP, GENITAL - Abnormal; Notable for the following:    Trich, Wet Prep PRESENT (*)    Clue Cells Wet Prep HPF POC PRESENT (*)    WBC, Wet Prep HPF POC PRESENT (*)    All other components within normal limits  COMPREHENSIVE METABOLIC PANEL - Abnormal; Notable for the following:    Glucose, Bld 105 (*)    Albumin 3.4 (*)    All other components within normal limits  URINALYSIS,  ROUTINE W REFLEX MICROSCOPIC (NOT AT [facility]) - Abnormal; Notable for the following:    APPearance CLOUDY (*)    Leukocytes, UA MODERATE (*)    All other components within normal limits  URINE MICROSCOPIC-ADD ON - Abnormal; Notable for the following:    Squamous Epithelial / LPF 6-30 (*)    Bacteria, UA RARE (*)    All other components within normal limits  CBC WITH DIFFERENTIAL/PLATELET  LIPASE, BLOOD  RPR  HIV ANTIBODY (ROUTINE TESTING)  I-STAT BETA HCG BLOOD, ED ([facility], [facility], [facility] ONLY)  GC/CHLAMYDIA PROBE AMP ([place]) NOT AT [facility]    Imaging Review No results found. I have personally reviewed and evaluated these images and lab results as part of my medical decision-making.   EKG Interpretation None      MDM   Final diagnoses:  Trichimoniasis  Bacterial vaginosis    Filed Vitals:   04/02/16 1130 04/02/16 1154 04/02/16 1200 04/02/16 1358  BP: 148/103  112/74 158/120  Pulse: 78  71 79  Temp:  98.4 F (36.9 C)  98.2 F (36.8 C)  TempSrc:  Oral  Oral  Resp: 18   15  SpO2: 100%  99% 100%    Medications  metroNIDAZOLE (FLAGYL) tablet 500 mg (500 mg Oral Given 04/02/16 1358)    Regina Estes is 50 y.o. female presenting with Lower abdominal pain and vaginal discharge for 1 week, patient boyfriend accompanies her, he was tested for gonorrhea and Chlamydia test was negative, he was tested for trichomoniasis this was positive. They had sex after treatment. Patient's boyfriend will be written a prescription for Flagyl, pelvic exam is not concerning for PID.  Patient will be treated for trichomoniasis and bacterial vaginosis with Flagyl 500 twice a day for 10 days, she is given referral to women's hospital, I stressed to her and to her boyfriend it is critically important that they refrain from sex until they're fully treated. The patient and boyfriend verbalized understanding, patient's boyfriend will also be treated for trichomoniasis PA lawyer has called in prescription for 2 g  Flagyl.  Analysis with moderate leukocytes, this is a highly contaminated specimen.Negative nitrite rare bacteria. She has no urinary symptoms, I think that the leukocytes are likely secondary to intravaginal infection.    Evaluation does not show pathology that would require ongoing emergent intervention or inpatient treatment. Pt is hemodynamically stable and mentating appropriately. Discussed findings and plan with patient/guardian, who agrees with care plan. All questions answered. Return precautions discussed and outpatient follow up given.   Discharge Medication List as of 04/02/2016  1:49 PM    START taking these medications   Details  metroNIDAZOLE (FLAGYL) 500 MG tablet Take 1 tablet (500 mg total) by mouth 2 (two) times daily. One tab PO bid x 10 days, Starting 04/02/2016, Until Discontinued, Print             [employer],  PA-C 04/02/16 [place], DO 04/03/16 [phone]

## 2016-04-02 NOTE — ED Notes (Signed)
Lower abd and vag d/c x 1 week states her boyfriend was tx last week for sdt

## 2016-04-03 LAB — HIV ANTIBODY (ROUTINE TESTING W REFLEX): HIV Screen 4th Generation wRfx: NONREACTIVE

## 2016-04-03 LAB — RPR: RPR: NONREACTIVE

## 2016-04-05 LAB — GC/CHLAMYDIA PROBE AMP (~~LOC~~) NOT AT ARMC
Chlamydia: NEGATIVE
NEISSERIA GONORRHEA: NEGATIVE

## 2016-06-04 ENCOUNTER — Emergency Department (HOSPITAL_COMMUNITY)
Admission: EM | Admit: 2016-06-04 | Discharge: 2016-06-04 | Disposition: A | Discharge status: Home / Self Care | Payer: Self-pay | Attending: Dermatology | Admitting: Dermatology

## 2016-06-04 ENCOUNTER — Encounter (HOSPITAL_COMMUNITY): Payer: Self-pay | Admitting: Emergency Medicine

## 2016-06-04 DIAGNOSIS — Z202 Contact with and (suspected) exposure to infections with a predominantly sexual mode of transmission: Secondary | ICD-10-CM | POA: Insufficient documentation

## 2016-06-04 DIAGNOSIS — Z5321 Procedure and treatment not carried out due to patient leaving prior to being seen by health care provider: Secondary | ICD-10-CM | POA: Insufficient documentation

## 2016-06-04 NOTE — ED Triage Notes (Signed)
States was told she had trich 2 weeks ago and she could not  Afford her meds and her boyfriend has it also

## 2018-01-13 ENCOUNTER — Encounter (HOSPITAL_COMMUNITY): Payer: Self-pay

## 2018-01-13 ENCOUNTER — Other Ambulatory Visit: Payer: Self-pay

## 2018-01-13 ENCOUNTER — Emergency Department (HOSPITAL_COMMUNITY)
Admission: EM | Admit: 2018-01-13 | Discharge: 2018-01-13 | Disposition: A | Discharge status: Home / Self Care | Payer: Self-pay | Attending: Emergency Medicine | Admitting: Emergency Medicine

## 2018-01-13 DIAGNOSIS — Z5321 Procedure and treatment not carried out due to patient leaving prior to being seen by health care provider: Secondary | ICD-10-CM | POA: Insufficient documentation

## 2018-01-13 DIAGNOSIS — R1110 Vomiting, unspecified: Secondary | ICD-10-CM | POA: Insufficient documentation

## 2018-01-13 DIAGNOSIS — R109 Unspecified abdominal pain: Secondary | ICD-10-CM | POA: Insufficient documentation

## 2018-01-13 NOTE — ED Triage Notes (Addendum)
Pt arrives c/o generalzied abd pain and emesis starting yesterday. Pt reports eating some M&M's that had a"nasty taste" and symptoms began shortly after. Denies nausea at this time. Denies diarrhea or constipation.

## 2018-01-16 NOTE — ED Notes (Signed)
01/16/2018   , Attempted follow-up call , no answer.
